# Patient Record
Sex: Male | Born: 1960 | Race: White | Hispanic: No | Smoking: Former smoker
Health system: Southern US, Community
[De-identification: ages and names within clinical notes are randomized; demographics above are authoritative.]

## PROBLEM LIST (undated history)

## (undated) DIAGNOSIS — M109 Gout, unspecified: Secondary | ICD-10-CM

## (undated) DIAGNOSIS — I2510 Atherosclerotic heart disease of native coronary artery without angina pectoris: Secondary | ICD-10-CM

## (undated) DIAGNOSIS — M2550 Pain in unspecified joint: Secondary | ICD-10-CM

## (undated) DIAGNOSIS — M25569 Pain in unspecified knee: Secondary | ICD-10-CM

## (undated) DIAGNOSIS — T7840XA Allergy, unspecified, initial encounter: Secondary | ICD-10-CM

## (undated) DIAGNOSIS — K602 Anal fissure, unspecified: Secondary | ICD-10-CM

## (undated) DIAGNOSIS — G4730 Sleep apnea, unspecified: Secondary | ICD-10-CM

## (undated) DIAGNOSIS — M179 Osteoarthritis of knee, unspecified: Secondary | ICD-10-CM

## (undated) DIAGNOSIS — Z973 Presence of spectacles and contact lenses: Secondary | ICD-10-CM

## (undated) DIAGNOSIS — R7303 Prediabetes: Secondary | ICD-10-CM

## (undated) DIAGNOSIS — M51369 Other intervertebral disc degeneration, lumbar region without mention of lumbar back pain or lower extremity pain: Secondary | ICD-10-CM

## (undated) DIAGNOSIS — I10 Essential (primary) hypertension: Secondary | ICD-10-CM

## (undated) DIAGNOSIS — K5130 Ulcerative (chronic) rectosigmoiditis without complications: Secondary | ICD-10-CM

## (undated) DIAGNOSIS — Z9189 Other specified personal risk factors, not elsewhere classified: Secondary | ICD-10-CM

## (undated) DIAGNOSIS — E785 Hyperlipidemia, unspecified: Secondary | ICD-10-CM

## (undated) DIAGNOSIS — M549 Dorsalgia, unspecified: Secondary | ICD-10-CM

## (undated) DIAGNOSIS — S83206A Unspecified tear of unspecified meniscus, current injury, right knee, initial encounter: Secondary | ICD-10-CM

## (undated) DIAGNOSIS — K219 Gastro-esophageal reflux disease without esophagitis: Secondary | ICD-10-CM

## (undated) DIAGNOSIS — M1710 Unilateral primary osteoarthritis, unspecified knee: Secondary | ICD-10-CM

## (undated) DIAGNOSIS — K635 Polyp of colon: Secondary | ICD-10-CM

## (undated) DIAGNOSIS — M5136 Other intervertebral disc degeneration, lumbar region: Secondary | ICD-10-CM

## (undated) DIAGNOSIS — H04129 Dry eye syndrome of unspecified lacrimal gland: Secondary | ICD-10-CM

## (undated) HISTORY — DX: Ulcerative (chronic) rectosigmoiditis without complications: K51.30

## (undated) HISTORY — DX: Dorsalgia, unspecified: M54.9

## (undated) HISTORY — DX: Pain in unspecified knee: M25.569

## (undated) HISTORY — DX: Allergy, unspecified, initial encounter: T78.40XA

## (undated) HISTORY — DX: Pain in unspecified joint: M25.50

## (undated) HISTORY — DX: Other intervertebral disc degeneration, lumbar region without mention of lumbar back pain or lower extremity pain: M51.369

## (undated) HISTORY — DX: Gout, unspecified: M10.9

## (undated) HISTORY — DX: Essential (primary) hypertension: I10

## (undated) HISTORY — DX: Anal fissure, unspecified: K60.2

## (undated) HISTORY — PX: WISDOM TOOTH EXTRACTION: SHX21

## (undated) HISTORY — DX: Other intervertebral disc degeneration, lumbar region: M51.36

## (undated) HISTORY — PX: FACIAL RECONSTRUCTION SURGERY: SHX631

## (undated) HISTORY — DX: Prediabetes: R73.03

## (undated) HISTORY — DX: Polyp of colon: K63.5

## (undated) HISTORY — DX: Atherosclerotic heart disease of native coronary artery without angina pectoris: I25.10

## (undated) HISTORY — DX: Dry eye syndrome of unspecified lacrimal gland: H04.129

---

## 1992-05-17 HISTORY — PX: KNEE ARTHROSCOPY WITH ANTERIOR CRUCIATE LIGAMENT (ACL) REPAIR: SHX5644

## 2006-10-03 ENCOUNTER — Emergency Department (HOSPITAL_COMMUNITY): Admission: EM | Admit: 2006-10-03 | Discharge: 2006-10-03 | Payer: Self-pay | Admitting: Family Medicine

## 2007-03-20 ENCOUNTER — Emergency Department (HOSPITAL_COMMUNITY): Admission: EM | Admit: 2007-03-20 | Discharge: 2007-03-20 | Payer: Self-pay | Admitting: Emergency Medicine

## 2008-08-02 ENCOUNTER — Emergency Department (HOSPITAL_COMMUNITY): Admission: EM | Admit: 2008-08-02 | Discharge: 2008-08-02 | Payer: Self-pay | Admitting: Emergency Medicine

## 2009-11-24 ENCOUNTER — Emergency Department (HOSPITAL_COMMUNITY): Admission: EM | Admit: 2009-11-24 | Discharge: 2009-11-24 | Payer: Self-pay | Admitting: Emergency Medicine

## 2010-10-25 ENCOUNTER — Ambulatory Visit (HOSPITAL_COMMUNITY): Payer: 59

## 2011-11-29 ENCOUNTER — Emergency Department (HOSPITAL_BASED_OUTPATIENT_CLINIC_OR_DEPARTMENT_OTHER)
Admission: EM | Admit: 2011-11-29 | Discharge: 2011-11-29 | Disposition: A | Discharge status: Home / Self Care | Payer: 59 | Attending: Emergency Medicine | Admitting: Emergency Medicine

## 2011-11-29 ENCOUNTER — Emergency Department (HOSPITAL_BASED_OUTPATIENT_CLINIC_OR_DEPARTMENT_OTHER): Payer: 59

## 2011-11-29 ENCOUNTER — Encounter (HOSPITAL_BASED_OUTPATIENT_CLINIC_OR_DEPARTMENT_OTHER): Payer: Self-pay | Admitting: *Deleted

## 2011-11-29 DIAGNOSIS — E7800 Pure hypercholesterolemia, unspecified: Secondary | ICD-10-CM | POA: Insufficient documentation

## 2011-11-29 DIAGNOSIS — X500XXA Overexertion from strenuous movement or load, initial encounter: Secondary | ICD-10-CM | POA: Insufficient documentation

## 2011-11-29 DIAGNOSIS — Y92009 Unspecified place in unspecified non-institutional (private) residence as the place of occurrence of the external cause: Secondary | ICD-10-CM | POA: Insufficient documentation

## 2011-11-29 DIAGNOSIS — S82401A Unspecified fracture of shaft of right fibula, initial encounter for closed fracture: Secondary | ICD-10-CM

## 2011-11-29 DIAGNOSIS — S82409A Unspecified fracture of shaft of unspecified fibula, initial encounter for closed fracture: Secondary | ICD-10-CM | POA: Insufficient documentation

## 2011-11-29 MED ORDER — OXYCODONE-ACETAMINOPHEN 5-325 MG PO TABS
2.0000 | ORAL_TABLET | Freq: Once | ORAL | Status: AC
  Administered 2011-11-29: 2 via ORAL
  Filled 2011-11-29: qty 2

## 2011-11-29 MED ORDER — OXYCODONE-ACETAMINOPHEN 5-325 MG PO TABS: 2.0000 | ORAL_TABLET | ORAL | Status: AC | PRN

## 2011-11-29 NOTE — ED Provider Notes (Signed)
History     CSN: [id number]  Arrival date & time 11/29/11  2208   First MD Initiated Contact with Patient 11/29/11 2220      Chief Complaint  Patient presents with  . Ankle Pain    (Consider location/radiation/quality/duration/timing/severity/associated sxs/prior treatment) HPI Comments: Pt states that when he climbed a six foot fence and landed he turned his ankle:pt c/o pain to the right lateral ankle  Patient is a 51 y.o. male presenting with ankle pain. The history is provided by the patient. No language interpreter was used.  Ankle Pain  The incident occurred less than 1 hour ago. The incident occurred at home. The injury mechanism was torsion. The pain is present in the right ankle. The quality of the pain is described as aching. The pain is moderate. The pain has been constant since onset. Pertinent negatives include no numbness, no inability to bear weight, no loss of motion, no loss of sensation and no tingling. He reports no foreign bodies present.    Past Medical History  Diagnosis Date  . High cholesterol     Past Surgical History  Procedure Date  . Facial reconstruction surgery   . Knee surgery     No family history on file.  History  Substance Use Topics  . Smoking status: Former Research scientist (life sciences)  . Smokeless tobacco: Never Used  . Alcohol Use: Not on file      Review of Systems  Constitutional: Negative.   Respiratory: Negative.   Cardiovascular: Negative.   Neurological: Negative for tingling and numbness.    Allergies  Review of patient's allergies indicates no known allergies.  Home Medications  No current outpatient prescriptions on file.  BP 143/87  Pulse 85  Temp 99.2 F (37.3 C) (Oral)  Resp 18  Ht 6' 1"  (1.854 m)  Wt 237 lb (107.502 kg)  BMI 31.27 kg/m2  SpO2 95%  Physical Exam  Nursing note and vitals reviewed. Constitutional: He is oriented to person, place, and time. He appears well-developed and well-nourished.  Cardiovascular:  Normal rate and regular rhythm.   Pulmonary/Chest: Effort normal and breath sounds normal.  Musculoskeletal: Normal range of motion.       Mild swelling noted to the right lateral ankle:pt has full rom:no gross deformity noted  Neurological: He is alert and oriented to person, place, and time.  Skin: Skin is warm and dry.  Psychiatric: He has a normal mood and affect.    ED Course  Procedures (including critical care time)  Labs Reviewed - No data to display Dg Ankle Complete Right  11/29/2011  *RADIOLOGY REPORT*  Clinical Data: Fall, lateral ankle pain  RIGHT ANKLE - COMPLETE 3+ VIEW  Comparison: None.  Findings: Oblique distal right fibular metadiaphyseal fracture noted.  Remote well corticated distal fibular avulsion fracture fragment or accessory ossicle.  The mortise is symmetric.  Lateral malleolar soft tissue swelling.  No radiopaque foreign body.  IMPRESSION: Oblique distal right fibular metadiaphyseal fracture.  Original Report Authenticated By: Arline Asp, M.D.     1. Right fibular fracture       MDM  Pt splinted by nursing staff;pt states that he is going to follow up with Dr. Collins:pt is neurologically intact        Glendell Docker, NP 11/29/11 Chicken, NP 11/29/11 5465

## 2011-11-29 NOTE — ED Notes (Signed)
Pt reports he climbed 6 foot fence and jumped- landed on right ankle and turned it

## 2011-12-02 NOTE — ED Provider Notes (Signed)
Medical screening examination/treatment/procedure(s) were performed by non-physician practitioner and as supervising physician I was immediately available for consultation/collaboration.   Saddie Benders. Dorna Mai, MD 12/02/11 2211

## 2012-02-21 ENCOUNTER — Ambulatory Visit: Payer: 59 | Attending: Specialist | Admitting: Physical Therapy

## 2012-02-21 DIAGNOSIS — IMO0001 Reserved for inherently not codable concepts without codable children: Secondary | ICD-10-CM | POA: Insufficient documentation

## 2012-02-21 DIAGNOSIS — M25669 Stiffness of unspecified knee, not elsewhere classified: Secondary | ICD-10-CM | POA: Insufficient documentation

## 2012-02-21 DIAGNOSIS — M25569 Pain in unspecified knee: Secondary | ICD-10-CM | POA: Insufficient documentation

## 2012-02-21 DIAGNOSIS — R5381 Other malaise: Secondary | ICD-10-CM | POA: Insufficient documentation

## 2012-02-23 ENCOUNTER — Ambulatory Visit: Payer: 59 | Admitting: Physical Therapy

## 2012-02-29 ENCOUNTER — Ambulatory Visit: Payer: 59 | Admitting: Rehabilitation

## 2012-03-02 ENCOUNTER — Ambulatory Visit: Payer: 59 | Admitting: Rehabilitation

## 2012-03-07 ENCOUNTER — Encounter: Payer: Self-pay | Admitting: Gastroenterology

## 2012-03-07 ENCOUNTER — Ambulatory Visit: Payer: 59 | Admitting: Rehabilitation

## 2012-03-09 ENCOUNTER — Ambulatory Visit: Payer: 59 | Admitting: Rehabilitation

## 2012-03-14 ENCOUNTER — Ambulatory Visit: Payer: 59 | Admitting: Physical Therapy

## 2012-03-16 ENCOUNTER — Ambulatory Visit: Payer: 59 | Admitting: Physical Therapy

## 2012-03-21 ENCOUNTER — Ambulatory Visit: Payer: 59 | Attending: Specialist | Admitting: Rehabilitation

## 2012-03-21 DIAGNOSIS — M25569 Pain in unspecified knee: Secondary | ICD-10-CM | POA: Insufficient documentation

## 2012-03-21 DIAGNOSIS — IMO0001 Reserved for inherently not codable concepts without codable children: Secondary | ICD-10-CM | POA: Insufficient documentation

## 2012-03-21 DIAGNOSIS — M25669 Stiffness of unspecified knee, not elsewhere classified: Secondary | ICD-10-CM | POA: Insufficient documentation

## 2012-03-21 DIAGNOSIS — R5381 Other malaise: Secondary | ICD-10-CM | POA: Insufficient documentation

## 2012-03-23 ENCOUNTER — Ambulatory Visit: Payer: 59 | Admitting: Rehabilitation

## 2012-03-28 ENCOUNTER — Ambulatory Visit: Payer: 59 | Admitting: Physical Therapy

## 2012-03-30 ENCOUNTER — Ambulatory Visit: Payer: 59 | Admitting: Physical Therapy

## 2012-04-03 ENCOUNTER — Ambulatory Visit (AMBULATORY_SURGERY_CENTER): Payer: 59 | Admitting: *Deleted

## 2012-04-03 VITALS — Ht 73.0 in | Wt 245.0 lb

## 2012-04-03 DIAGNOSIS — Z1211 Encounter for screening for malignant neoplasm of colon: Secondary | ICD-10-CM

## 2012-04-03 MED ORDER — MOVIPREP 100 G PO SOLR: ORAL | Status: DC

## 2012-04-04 ENCOUNTER — Encounter: Payer: 59 | Admitting: Physical Therapy

## 2012-04-06 ENCOUNTER — Encounter: Payer: 59 | Admitting: Rehabilitation

## 2012-04-24 ENCOUNTER — Telehealth: Payer: Self-pay | Admitting: *Deleted

## 2012-04-24 ENCOUNTER — Ambulatory Visit (AMBULATORY_SURGERY_CENTER): Payer: 59 | Admitting: Gastroenterology

## 2012-04-24 ENCOUNTER — Other Ambulatory Visit: Payer: Self-pay | Admitting: Gastroenterology

## 2012-04-24 ENCOUNTER — Encounter: Payer: Self-pay | Admitting: Gastroenterology

## 2012-04-24 VITALS — BP 136/91 | HR 50 | Temp 98.9°F | Resp 19 | Ht 73.0 in | Wt 245.0 lb

## 2012-04-24 DIAGNOSIS — D126 Benign neoplasm of colon, unspecified: Secondary | ICD-10-CM

## 2012-04-24 DIAGNOSIS — Z1211 Encounter for screening for malignant neoplasm of colon: Secondary | ICD-10-CM

## 2012-04-24 DIAGNOSIS — K635 Polyp of colon: Secondary | ICD-10-CM

## 2012-04-24 HISTORY — PX: COLONOSCOPY: SHX174

## 2012-04-24 MED ORDER — PRAMOXINE-HC 1-1 % EX CREA: TOPICAL_CREAM | CUTANEOUS | Status: AC

## 2012-04-24 MED ORDER — SODIUM CHLORIDE 0.9 % IV SOLN: 500.0000 mL | INTRAVENOUS | Status: DC

## 2012-04-24 MED ORDER — PRAMOXINE-HC 1-1 % EX CREA: TOPICAL_CREAM | CUTANEOUS | Status: DC

## 2012-04-24 NOTE — Patient Instructions (Addendum)
2 small polyps removed and sent to pathology.  Await pathology results for final recommendations.   YOU HAD AN ENDOSCOPIC PROCEDURE TODAY AT [facility]: Refer to the procedure report that was given to you for any specific questions about what was found during the examination.  If the procedure report does not answer your questions, please call your gastroenterologist to clarify.  If you requested that your care partner not be given the details of your procedure findings, then the procedure report has been included in a sealed envelope for you to review at your convenience later.  YOU SHOULD EXPECT: Some feelings of bloating in the abdomen. Passage of more gas than usual.  Walking can help get rid of the air that was put into your GI tract during the procedure and reduce the bloating. If you had a lower endoscopy (such as a colonoscopy or flexible sigmoidoscopy) you may notice spotting of blood in your stool or on the toilet paper. If you underwent a bowel prep for your procedure, then you may not have a normal bowel movement for a few days.  DIET: Your first meal following the procedure should be a light meal and then it is ok to progress to your normal diet.  A half-sandwich or bowl of soup is an example of a good first meal.  Heavy or fried foods are harder to digest and may make you feel nauseous or bloated.  Likewise meals heavy in dairy and vegetables can cause extra gas to form and this can also increase the bloating.  Drink plenty of fluids but you should avoid alcoholic beverages for 24 hours.  ACTIVITY: Your care partner should take you home directly after the procedure.  You should plan to take it easy, moving slowly for the rest of the day.  You can resume normal activity the day after the procedure however you should NOT DRIVE or use heavy machinery for 24 hours (because of the sedation medicines used during the test).    SYMPTOMS TO REPORT IMMEDIATELY: A gastroenterologist  can be reached at any hour.  During normal business hours, 8:30 AM to 5:00 PM Monday through Friday, call [phone].  After hours and on weekends, please call the GI answering service at [phone] who will take a message and have the physician on call contact you.   Following lower endoscopy (colonoscopy or flexible sigmoidoscopy):  Excessive amounts of blood in the stool  Significant tenderness or worsening of abdominal pains  Swelling of the abdomen that is new, acute  Fever of 100F or higher  FOLLOW UP: If any biopsies were taken you will be contacted by phone or by letter within the next 1-3 weeks.  Call your gastroenterologist if you have not heard about the biopsies in 3 weeks.  Our staff will call the home number listed on your records the next business day following your procedure to check on you and address any questions or concerns that you may have at that time regarding the information given to you following your procedure. This is a courtesy call and so if there is no answer at the home number and we have not heard from you through the emergency physician on call, we will assume that you have returned to your regular daily activities without incident.  SIGNATURES/CONFIDENTIALITY: You and/or your care partner have signed paperwork which will be entered into your electronic medical record.  These signatures attest to the fact that that the information above on your  After Visit Summary has been reviewed and is understood.  Full responsibility of the confidentiality of this discharge information lies with you and/or your care-partner.

## 2012-04-24 NOTE — Progress Notes (Signed)
Pts wife asked about a "cream" that Dr Sharlett Iles had said he would get for pt.  Checked dumbwaiter for samples but found none.  Called Cynthia Dr Tomie China nurse and she stated that they are out of Analpram samples and she would call in an Rx to [facility] outpatient pharmacy.

## 2012-04-24 NOTE — Progress Notes (Signed)
Called to room to assist during endoscopic procedure.  Patient ID and intended procedure confirmed with present staff. Received instructions for my participation in the procedure from the performing physician.  

## 2012-04-24 NOTE — Op Note (Signed)
[facility]  [address] [place], [id number]   COLONOSCOPY PROCEDURE REPORT  PATIENT: David Newman, David Newman  MR#: [id number] BIRTHDATE: January 25, 1961 , 51  yrs. old GENDER: Male ENDOSCOPIST: Sable Feil, MD, Marval Regal REFERRED BY:  Gwendolyn Grant, M.D. PROCEDURE DATE:  04/24/2012 PROCEDURE:   Colonoscopy with biopsy ASA CLASS:   Class II INDICATIONS:Patient's family history of colon polyps. MEDICATIONS: propofol (Diprivan) 31m IV  DESCRIPTION OF PROCEDURE:   After the risks and benefits and of the procedure were explained, informed consent was obtained.  A digital rectal exam revealed no abnormalities of the rectum.    The LB CF-[id number] endoscope was introduced through the anus and advanced to the cecum, which was identified by both the appendix and ileocecal valve .  The quality of the prep was adequate. .  The instrument was then slowly withdrawn as the colon was fully examined.     COLON FINDINGS: A normal appearing cecum, ileocecal valve, and appendiceal orifice were identified.  The ascending, hepatic flexure, transverse, splenic flexure, descending, sigmoid colon and rectum appeared unremarkable.  No polyps or cancers were seen. Two small smooth flat polyps were found in the sigmoid colon. Multiple biopsies of the area were performed using cold forceps. The scope was then withdrawn from the patient and the procedure completed.  COMPLICATIONS: There were no complications. ENDOSCOPIC IMPRESSION: 1.   Normal colon,no mucosal lesions noted... 2.   Two small flat polyps were found in the sigmoid colon; multiple biopsies of the area were performed using cold forceps ..Marland Kitchen/O adenomas  RECOMMENDATIONS: 1.  Repeat colonoscopy in 5 years if polyp adenomatous; otherwise 10 years 2.  Await biopsy results   REPEAT EXAM:  cc:  _______________________________ eSigned:Sable Feil MD, [facility]12/01/2012 12:15 PM

## 2012-04-24 NOTE — Progress Notes (Signed)
Patient did not experience any of the following events: a burn prior to discharge; a fall within the facility; wrong site/side/patient/procedure/implant event; or a hospital transfer or hospital admission upon discharge from the facility. (G8907) Patient did not have preoperative order for IV antibiotic SSI prophylaxis. (G8918)  

## 2012-04-24 NOTE — Telephone Encounter (Signed)
Ordered analpram for pt; we have no samples

## 2012-04-25 ENCOUNTER — Telehealth: Payer: Self-pay | Admitting: *Deleted

## 2012-04-25 NOTE — Telephone Encounter (Signed)
  Follow up Call-  Call back number 04/24/2012  Post procedure Call Back phone  # [phone]  Permission to leave phone message Yes     [facility]

## 2012-04-28 ENCOUNTER — Encounter: Payer: Self-pay | Admitting: Gastroenterology

## 2014-01-15 ENCOUNTER — Other Ambulatory Visit (HOSPITAL_COMMUNITY): Payer: Self-pay | Admitting: Specialist

## 2014-01-15 DIAGNOSIS — M25561 Pain in right knee: Secondary | ICD-10-CM

## 2014-01-23 ENCOUNTER — Ambulatory Visit (HOSPITAL_COMMUNITY)
Admission: RE | Admit: 2014-01-23 | Discharge: 2014-01-23 | Disposition: A | Discharge status: Home / Self Care | Payer: 59 | Source: Ambulatory Visit | Attending: Specialist | Admitting: Specialist

## 2014-01-23 DIAGNOSIS — X58XXXA Exposure to other specified factors, initial encounter: Secondary | ICD-10-CM | POA: Diagnosis not present

## 2014-01-23 DIAGNOSIS — IMO0002 Reserved for concepts with insufficient information to code with codable children: Secondary | ICD-10-CM | POA: Diagnosis not present

## 2014-01-23 DIAGNOSIS — M7120 Synovial cyst of popliteal space [Baker], unspecified knee: Secondary | ICD-10-CM | POA: Diagnosis not present

## 2014-01-23 DIAGNOSIS — S83289A Other tear of lateral meniscus, current injury, unspecified knee, initial encounter: Secondary | ICD-10-CM | POA: Diagnosis not present

## 2014-01-23 DIAGNOSIS — Z9889 Other specified postprocedural states: Secondary | ICD-10-CM | POA: Diagnosis not present

## 2014-01-23 DIAGNOSIS — M25469 Effusion, unspecified knee: Secondary | ICD-10-CM | POA: Diagnosis not present

## 2014-01-23 DIAGNOSIS — M2340 Loose body in knee, unspecified knee: Secondary | ICD-10-CM | POA: Diagnosis not present

## 2014-01-23 DIAGNOSIS — M25569 Pain in unspecified knee: Secondary | ICD-10-CM | POA: Diagnosis present

## 2014-01-23 DIAGNOSIS — M25561 Pain in right knee: Secondary | ICD-10-CM

## 2014-01-23 DIAGNOSIS — M9420 Chondromalacia, unspecified site: Secondary | ICD-10-CM | POA: Diagnosis not present

## 2014-02-28 ENCOUNTER — Other Ambulatory Visit: Payer: Self-pay | Admitting: Orthopedic Surgery

## 2014-03-26 ENCOUNTER — Encounter (HOSPITAL_BASED_OUTPATIENT_CLINIC_OR_DEPARTMENT_OTHER): Payer: Self-pay | Admitting: *Deleted

## 2014-03-27 ENCOUNTER — Encounter (HOSPITAL_BASED_OUTPATIENT_CLINIC_OR_DEPARTMENT_OTHER): Payer: Self-pay | Admitting: *Deleted

## 2014-03-27 NOTE — Progress Notes (Signed)
   03/27/14 1358  OBSTRUCTIVE SLEEP APNEA  Have you ever been diagnosed with sleep apnea through a sleep study? No  Do you snore loudly (loud enough to be heard through closed doors)?  1  Do you often feel tired, fatigued, or sleepy during the daytime? 0  Has anyone observed you stop breathing during your sleep? 1  Do you have, or are you being treated for high blood pressure? 0  BMI more than 35 kg/m2? 0  Age over 52 years old? 1  Neck circumference greater than 40 cm/16 inches? 1  Gender: 1  Obstructive Sleep Apnea Score 5  Score 4 or greater  Results sent to PCP

## 2014-03-27 NOTE — Progress Notes (Signed)
NPO AFTER MN. ARRIVE AT 0600. NEEDS HG. MAY TAKE TYLENOL IF NEEDED AM DOS W/ SIPS OF WATER.

## 2014-03-28 NOTE — Anesthesia Preprocedure Evaluation (Addendum)
Anesthesia Evaluation  Patient identified by MRN, date of birth, ID band Patient awake    Reviewed: Allergy & Precautions, H&P , NPO status , Patient's Chart, lab work & pertinent test results  Airway Mallampati: II  TM Distance: >3 FB Neck ROM: Full    Dental no notable dental hx. (+) Teeth Intact, Dental Advisory Given   Pulmonary former smoker,  breath sounds clear to auscultation  Pulmonary exam normal       Cardiovascular negative cardio ROS  Rhythm:Regular Rate:Normal     Neuro/Psych negative neurological ROS  negative psych ROS   GI/Hepatic negative GI ROS, Neg liver ROS, GERD-  ,  Endo/Other  negative endocrine ROS  Renal/GU negative Renal ROS     Musculoskeletal negative musculoskeletal ROS (+) Arthritis -, Osteoarthritis,    Abdominal   Peds  Hematology negative hematology ROS (+)   Anesthesia Other Findings   Reproductive/Obstetrics                            Anesthesia Physical Anesthesia Plan  ASA: II  Anesthesia Plan: General   Post-op Pain Management:    Induction: Intravenous  Airway Management Planned: LMA  Additional Equipment:   Intra-op Plan:   Post-operative Plan: Extubation in OR  Informed Consent: I have reviewed the patients History and Physical, chart, labs and discussed the procedure including the risks, benefits and alternatives for the proposed anesthesia with the patient or authorized representative who has indicated his/her understanding and acceptance.   Dental advisory given  Plan Discussed with: CRNA  Anesthesia Plan Comments:         Anesthesia Quick Evaluation

## 2014-03-29 ENCOUNTER — Encounter (HOSPITAL_BASED_OUTPATIENT_CLINIC_OR_DEPARTMENT_OTHER): Payer: Self-pay | Admitting: *Deleted

## 2014-03-29 ENCOUNTER — Ambulatory Visit (HOSPITAL_BASED_OUTPATIENT_CLINIC_OR_DEPARTMENT_OTHER)
Admission: RE | Admit: 2014-03-29 | Discharge: 2014-03-29 | Disposition: A | Discharge status: Home / Self Care | Payer: 59 | Source: Ambulatory Visit | Attending: Specialist | Admitting: Specialist

## 2014-03-29 ENCOUNTER — Encounter (HOSPITAL_BASED_OUTPATIENT_CLINIC_OR_DEPARTMENT_OTHER)
Admission: RE | Disposition: A | Discharge status: Home / Self Care | Payer: Self-pay | Source: Ambulatory Visit | Attending: Specialist

## 2014-03-29 ENCOUNTER — Ambulatory Visit (HOSPITAL_BASED_OUTPATIENT_CLINIC_OR_DEPARTMENT_OTHER): Payer: 59 | Admitting: Anesthesiology

## 2014-03-29 DIAGNOSIS — K219 Gastro-esophageal reflux disease without esophagitis: Secondary | ICD-10-CM | POA: Insufficient documentation

## 2014-03-29 DIAGNOSIS — S83241A Other tear of medial meniscus, current injury, right knee, initial encounter: Secondary | ICD-10-CM | POA: Diagnosis not present

## 2014-03-29 DIAGNOSIS — Y9289 Other specified places as the place of occurrence of the external cause: Secondary | ICD-10-CM | POA: Diagnosis not present

## 2014-03-29 DIAGNOSIS — X58XXXA Exposure to other specified factors, initial encounter: Secondary | ICD-10-CM | POA: Diagnosis not present

## 2014-03-29 DIAGNOSIS — Y998 Other external cause status: Secondary | ICD-10-CM | POA: Insufficient documentation

## 2014-03-29 DIAGNOSIS — Z87891 Personal history of nicotine dependence: Secondary | ICD-10-CM | POA: Insufficient documentation

## 2014-03-29 DIAGNOSIS — Z9889 Other specified postprocedural states: Secondary | ICD-10-CM

## 2014-03-29 DIAGNOSIS — Z7982 Long term (current) use of aspirin: Secondary | ICD-10-CM | POA: Diagnosis not present

## 2014-03-29 DIAGNOSIS — S83271A Complex tear of lateral meniscus, current injury, right knee, initial encounter: Secondary | ICD-10-CM | POA: Insufficient documentation

## 2014-03-29 DIAGNOSIS — Y9389 Activity, other specified: Secondary | ICD-10-CM | POA: Insufficient documentation

## 2014-03-29 DIAGNOSIS — M2241 Chondromalacia patellae, right knee: Secondary | ICD-10-CM | POA: Insufficient documentation

## 2014-03-29 DIAGNOSIS — M1711 Unilateral primary osteoarthritis, right knee: Secondary | ICD-10-CM | POA: Insufficient documentation

## 2014-03-29 DIAGNOSIS — E785 Hyperlipidemia, unspecified: Secondary | ICD-10-CM | POA: Diagnosis not present

## 2014-03-29 DIAGNOSIS — M94261 Chondromalacia, right knee: Secondary | ICD-10-CM | POA: Diagnosis not present

## 2014-03-29 DIAGNOSIS — Z882 Allergy status to sulfonamides status: Secondary | ICD-10-CM | POA: Insufficient documentation

## 2014-03-29 HISTORY — DX: Presence of spectacles and contact lenses: Z97.3

## 2014-03-29 HISTORY — DX: Osteoarthritis of knee, unspecified: M17.9

## 2014-03-29 HISTORY — DX: Unspecified tear of unspecified meniscus, current injury, right knee, initial encounter: S83.206A

## 2014-03-29 HISTORY — DX: Hyperlipidemia, unspecified: E78.5

## 2014-03-29 HISTORY — DX: Gastro-esophageal reflux disease without esophagitis: K21.9

## 2014-03-29 HISTORY — PX: KNEE ARTHROSCOPY: SHX127

## 2014-03-29 HISTORY — DX: Unilateral primary osteoarthritis, unspecified knee: M17.10

## 2014-03-29 HISTORY — DX: Other specified personal risk factors, not elsewhere classified: Z91.89

## 2014-03-29 LAB — POCT HEMOGLOBIN-HEMACUE: HEMOGLOBIN: 15.2 g/dL (ref 13.0–17.0)

## 2014-03-29 SURGERY — ARTHROSCOPY, KNEE
Anesthesia: General | Site: Knee | Laterality: Right

## 2014-03-29 MED ORDER — PROPOFOL 10 MG/ML IV BOLUS
INTRAVENOUS | Status: DC | PRN
  Administered 2014-03-29: 200 mg via INTRAVENOUS

## 2014-03-29 MED ORDER — FENTANYL CITRATE 0.05 MG/ML IJ SOLN
INTRAMUSCULAR | Status: AC
  Filled 2014-03-29: qty 6

## 2014-03-29 MED ORDER — MIDAZOLAM HCL 5 MG/5ML IJ SOLN
INTRAMUSCULAR | Status: DC | PRN
  Administered 2014-03-29: 2 mg via INTRAVENOUS

## 2014-03-29 MED ORDER — CEFAZOLIN SODIUM-DEXTROSE 2-3 GM-% IV SOLR
2.0000 g | INTRAVENOUS | Status: AC
  Administered 2014-03-29: 2 g via INTRAVENOUS
  Filled 2014-03-29: qty 50

## 2014-03-29 MED ORDER — ONDANSETRON HCL 4 MG/2ML IJ SOLN
INTRAMUSCULAR | Status: DC | PRN
  Administered 2014-03-29: 4 mg via INTRAVENOUS

## 2014-03-29 MED ORDER — MORPHINE SULFATE 4 MG/ML IJ SOLN
INTRAMUSCULAR | Status: DC | PRN
  Administered 2014-03-29: 4 mg via INTRAVENOUS

## 2014-03-29 MED ORDER — OXYCODONE HCL 5 MG PO TABS
5.0000 mg | ORAL_TABLET | Freq: Once | ORAL | Status: DC | PRN
  Filled 2014-03-29: qty 1

## 2014-03-29 MED ORDER — HYDROCODONE-ACETAMINOPHEN 5-325 MG PO TABS: 1.0000 | ORAL_TABLET | Freq: Four times a day (QID) | ORAL | Status: DC | PRN

## 2014-03-29 MED ORDER — HYDROCODONE-ACETAMINOPHEN 5-325 MG PO TABS
1.0000 | ORAL_TABLET | Freq: Four times a day (QID) | ORAL | Status: DC | PRN
  Administered 2014-03-29: 1 via ORAL
  Filled 2014-03-29: qty 2

## 2014-03-29 MED ORDER — MIDAZOLAM HCL 2 MG/2ML IJ SOLN
INTRAMUSCULAR | Status: AC
  Filled 2014-03-29: qty 2

## 2014-03-29 MED ORDER — MEPERIDINE HCL 25 MG/ML IJ SOLN
6.2500 mg | INTRAMUSCULAR | Status: DC | PRN
  Filled 2014-03-29: qty 1

## 2014-03-29 MED ORDER — HYDROCODONE-ACETAMINOPHEN 5-325 MG PO TABS
ORAL_TABLET | ORAL | Status: AC
  Filled 2014-03-29: qty 1

## 2014-03-29 MED ORDER — MORPHINE SULFATE 4 MG/ML IJ SOLN
INTRAMUSCULAR | Status: AC
  Filled 2014-03-29: qty 1

## 2014-03-29 MED ORDER — BUPIVACAINE HCL 0.25 % IJ SOLN
INTRAMUSCULAR | Status: DC | PRN
  Administered 2014-03-29: 30 mL

## 2014-03-29 MED ORDER — FENTANYL CITRATE 0.05 MG/ML IJ SOLN
INTRAMUSCULAR | Status: DC | PRN
  Administered 2014-03-29: 50 ug via INTRAVENOUS
  Administered 2014-03-29: 150 ug via INTRAVENOUS

## 2014-03-29 MED ORDER — CEFAZOLIN SODIUM-DEXTROSE 2-3 GM-% IV SOLR
INTRAVENOUS | Status: AC
Start: 2014-03-29 — End: 2014-03-29
  Filled 2014-03-29: qty 50

## 2014-03-29 MED ORDER — CEPHALEXIN 500 MG PO CAPS: 500.0000 mg | ORAL_CAPSULE | Freq: Three times a day (TID) | ORAL | Status: DC

## 2014-03-29 MED ORDER — LACTATED RINGERS IV SOLN
INTRAVENOUS | Status: DC
  Administered 2014-03-29: 07:00:00 via INTRAVENOUS
  Filled 2014-03-29: qty 1000

## 2014-03-29 MED ORDER — ASPIRIN EC 325 MG PO TBEC: 325.0000 mg | DELAYED_RELEASE_TABLET | Freq: Two times a day (BID) | ORAL | Status: DC

## 2014-03-29 MED ORDER — LIDOCAINE HCL (CARDIAC) 20 MG/ML IV SOLN
INTRAVENOUS | Status: DC | PRN
  Administered 2014-03-29: 80 mg via INTRAVENOUS

## 2014-03-29 MED ORDER — SODIUM CHLORIDE 0.9 % IR SOLN
Status: DC | PRN
  Administered 2014-03-29: 6000 mL
  Administered 2014-03-29: 3000 mL

## 2014-03-29 MED ORDER — OXYCODONE HCL 5 MG/5ML PO SOLN
5.0000 mg | Freq: Once | ORAL | Status: DC | PRN
  Filled 2014-03-29: qty 5

## 2014-03-29 MED ORDER — PROMETHAZINE HCL 25 MG/ML IJ SOLN
6.2500 mg | INTRAMUSCULAR | Status: DC | PRN
  Filled 2014-03-29: qty 1

## 2014-03-29 MED ORDER — DEXAMETHASONE SODIUM PHOSPHATE 4 MG/ML IJ SOLN
INTRAMUSCULAR | Status: DC | PRN
  Administered 2014-03-29: 10 mg via INTRAVENOUS

## 2014-03-29 MED ORDER — POVIDONE-IODINE 7.5 % EX SOLN
Freq: Once | CUTANEOUS | Status: DC
  Filled 2014-03-29: qty 118

## 2014-03-29 MED ORDER — HYDROMORPHONE HCL 1 MG/ML IJ SOLN
0.2500 mg | INTRAMUSCULAR | Status: DC | PRN
  Filled 2014-03-29: qty 1

## 2014-03-29 SURGICAL SUPPLY — 59 items
BANDAGE ESMARK 6X9 LF (GAUZE/BANDAGES/DRESSINGS) ×1 IMPLANT
BLADE 4.2CUDA (BLADE) IMPLANT
BLADE CUDA 4.2 (BLADE) IMPLANT
BLADE CUDA GRT WHITE 3.5 (BLADE) ×2 IMPLANT
BLADE CUDA SHAVER 3.5 (BLADE) IMPLANT
BNDG ESMARK 6X9 LF (GAUZE/BANDAGES/DRESSINGS) ×2
BNDG GAUZE ELAST 4 BULKY (GAUZE/BANDAGES/DRESSINGS) ×2 IMPLANT
CANISTER SUCT LVC 12 LTR MEDI- (MISCELLANEOUS) ×2 IMPLANT
CANISTER SUCTION 1200CC (MISCELLANEOUS) IMPLANT
CANISTER SUCTION 2500CC (MISCELLANEOUS) ×4 IMPLANT
CLOTH BEACON ORANGE TIMEOUT ST (SAFETY) ×2 IMPLANT
CUFF TOURNIQUET SINGLE 34IN LL (TOURNIQUET CUFF) ×2 IMPLANT
DRAPE ARTHROSCOPY W/POUCH 114 (DRAPES) ×2 IMPLANT
DRAPE INCISE 23X17 IOBAN STRL (DRAPES)
DRAPE INCISE IOBAN 23X17 STRL (DRAPES) IMPLANT
DRAPE INCISE IOBAN 66X45 STRL (DRAPES) ×2 IMPLANT
DRSG PAD ABDOMINAL 8X10 ST (GAUZE/BANDAGES/DRESSINGS) ×2 IMPLANT
DURAPREP 26ML APPLICATOR (WOUND CARE) ×2 IMPLANT
ELECT MENISCUS 165MM 90D (ELECTRODE) IMPLANT
ELECT REM PT RETURN 9FT ADLT (ELECTROSURGICAL)
ELECTRODE REM PT RTRN 9FT ADLT (ELECTROSURGICAL) IMPLANT
GAUZE XEROFORM 1X8 LF (GAUZE/BANDAGES/DRESSINGS) ×2 IMPLANT
GLOVE BIO SURGEON STRL SZ 6.5 (GLOVE) ×2 IMPLANT
GLOVE BIO SURGEON STRL SZ7.5 (GLOVE) ×2 IMPLANT
GLOVE BIO SURGEON STRL SZ8 (GLOVE) ×2 IMPLANT
GLOVE BIOGEL PI IND STRL 6.5 (GLOVE) ×2 IMPLANT
GLOVE BIOGEL PI INDICATOR 6.5 (GLOVE) ×2
GLOVE INDICATOR 8.0 STRL GRN (GLOVE) ×4 IMPLANT
GLOVE SURG ORTHO 8.0 STRL STRW (GLOVE) IMPLANT
GOWN PREVENTION PLUS LG XLONG (DISPOSABLE) IMPLANT
GOWN STRL REIN XL XLG (GOWN DISPOSABLE) IMPLANT
GOWN STRL REUS W/ TWL LRG LVL3 (GOWN DISPOSABLE) ×1 IMPLANT
GOWN STRL REUS W/ TWL XL LVL3 (GOWN DISPOSABLE) ×2 IMPLANT
GOWN STRL REUS W/TWL LRG LVL3 (GOWN DISPOSABLE) ×1
GOWN STRL REUS W/TWL XL LVL3 (GOWN DISPOSABLE) ×2
IMMOBILIZER KNEE 22 UNIV (SOFTGOODS) IMPLANT
IMMOBILIZER KNEE 24 THIGH 36 (MISCELLANEOUS) IMPLANT
IMMOBILIZER KNEE 24 UNIV (MISCELLANEOUS)
IV NS IRRIG 3000ML ARTHROMATIC (IV SOLUTION) ×4 IMPLANT
KNEE WRAP E Z 3 GEL PACK (MISCELLANEOUS) ×2 IMPLANT
MINI VAC (SURGICAL WAND) ×2 IMPLANT
NEEDLE HYPO 22GX1.5 SAFETY (NEEDLE) ×2 IMPLANT
PACK ARTHROSCOPY DSU (CUSTOM PROCEDURE TRAY) ×2 IMPLANT
PACK BASIN DAY SURGERY FS (CUSTOM PROCEDURE TRAY) ×2 IMPLANT
PAD ABD 8X10 STRL (GAUZE/BANDAGES/DRESSINGS) ×4 IMPLANT
PADDING CAST ABS 4INX4YD NS (CAST SUPPLIES)
PADDING CAST ABS COTTON 4X4 ST (CAST SUPPLIES) IMPLANT
PENCIL BUTTON HOLSTER BLD 10FT (ELECTRODE) IMPLANT
SET ARTHROSCOPY TUBING (MISCELLANEOUS) ×1
SET ARTHROSCOPY TUBING LN (MISCELLANEOUS) ×1 IMPLANT
SPONGE GAUZE 4X4 12PLY (GAUZE/BANDAGES/DRESSINGS) ×2 IMPLANT
SPONGE GAUZE 4X4 12PLY STER LF (GAUZE/BANDAGES/DRESSINGS) ×2 IMPLANT
SUT ETHILON 4 0 PS 2 18 (SUTURE) ×2 IMPLANT
SYR CONTROL 10ML LL (SYRINGE) ×2 IMPLANT
TOWEL OR 17X24 6PK STRL BLUE (TOWEL DISPOSABLE) ×2 IMPLANT
TUBE CONNECTING 12X1/4 (SUCTIONS) ×2 IMPLANT
WAND 30 DEG SABER W/CORD (SURGICAL WAND) IMPLANT
WAND 90 DEG TURBOVAC W/CORD (SURGICAL WAND) IMPLANT
WATER STERILE IRR 500ML POUR (IV SOLUTION) ×2 IMPLANT

## 2014-03-29 NOTE — H&P (Signed)
David Newman is an 53 y.o. male.   Chief Complaint: Right knee discomfort HPI: Patient presents with joint discomfort that had been persistent for several months now. Despite conservative treatments, his discomfort has not improved. Imaging was obtained. Other conservative and surgical treatments were discussed in detail. Patient wishes to proceed with surgery as consented. Denies SOB, CP, or calf pain. No Fever, chills, or nausea/ vomiting. History of right ACL by Dr. Percell Miller.   Past Medical History  Diagnosis Date  . Right knee meniscal tear   . OA (osteoarthritis) of knee   . Hyperlipidemia   . GERD (gastroesophageal reflux disease)   . Wears glasses   . At risk for sleep apnea     STOP-BANG= 5    SENT TO PCP 03-27-2014  . Wears contact lenses     Past Surgical History  Procedure Laterality Date  . Facial reconstruction surgery  1973  age 88  . Knee arthroscopy with anterior cruciate ligament (acl) repair Right 1994  . Wisdom tooth extraction  age 39  . Colonoscopy  04-24-2012    Family History  Problem Relation Age of Onset  . Colon polyps Brother 43  . Colon cancer Paternal Uncle 3  . Stomach cancer Neg Hx   . Colon cancer Cousin 84   Social History:  reports that he quit smoking about 6 years ago. His smoking use included Cigarettes. He has a 35 pack-year smoking history. He has quit using smokeless tobacco. His smokeless tobacco use included Snuff. He reports that he drinks alcohol. He reports that he does not use illicit drugs.  Allergies:  Allergies  Allergen Reactions  . Sulfa Antibiotics Nausea And Vomiting    Medications Prior to Admission  Medication Sig Dispense Refill  . acetaminophen (TYLENOL) 500 MG tablet Take 1,000 mg by mouth every 6 (six) hours as needed. Patient used this medication for pain.    Marland Kitchen aspirin EC 325 MG tablet Take 325 mg by mouth every 6 (six) hours as needed.    . calcium carbonate (TUMS - DOSED IN MG ELEMENTAL CALCIUM) 500 MG  chewable tablet Chew 1 tablet by mouth as needed for indigestion or heartburn.    Marland Kitchen ibuprofen (ADVIL,MOTRIN) 200 MG tablet Take 600 mg by mouth every 6 (six) hours as needed. Patient used this medication for pain.    . rosuvastatin (CRESTOR) 5 MG tablet Take 5 mg by mouth at bedtime.     . Multiple Vitamin (ONE-A-DAY MENS PO) Take 1 tablet by mouth daily.      No results found for this or any previous visit (from the past 48 hour(s)). No results found.  Review of Systems  Constitutional: Negative.   HENT: Negative.   Eyes: Negative.   Respiratory: Negative.   Cardiovascular: Negative.   Gastrointestinal: Negative.   Genitourinary: Negative.   Musculoskeletal: Positive for joint pain.  Skin: Negative.   Neurological: Negative.   Endo/Heme/Allergies: Negative.   Psychiatric/Behavioral: Negative.     Blood pressure 140/85, pulse 62, temperature 98.4 F (36.9 C), temperature source Oral, resp. rate 16, weight 105.235 kg (232 lb), SpO2 98 %. Physical Exam  Constitutional: He is oriented to person, place, and time. He appears well-developed.  HENT:  Head: Normocephalic.  Eyes: EOM are normal.  Neck: Normal range of motion.  Cardiovascular: Normal rate, regular rhythm, normal heart sounds and intact distal pulses.   Respiratory: Effort normal and breath sounds normal.  GI: Soft. Bowel sounds are normal.  Genitourinary:  Deferred  Musculoskeletal:  Right knee pain with ambulating. Calf soft and non- tender. Sensation intact.  Neurological: He is alert and oriented to person, place, and time.  Skin: Skin is warm and dry.  Psychiatric: His behavior is normal.     Assessment/Plan Right knee MT and OA: Right knee scope PM and chondroplasty D/c home Follow up in office   STILWELL, BRYSON L 03/29/2014, 7:47 AM

## 2014-03-29 NOTE — Discharge Instructions (Signed)

## 2014-03-29 NOTE — Transfer of Care (Signed)
Immediate Anesthesia Transfer of Care Note  Patient: David Newman  Procedure(s) Performed: Procedure(s): RIGHT ARTHROSCOPY KNEE WITH DEBRIDMENT PARTIAL medial lateral MENISCECTOMY AND CHONDROPLASTY (Right)  Patient Location: PACU  Anesthesia Type:General  Level of Consciousness: awake, alert , oriented and patient cooperative  Airway & Oxygen Therapy: Patient Spontanous Breathing and Patient connected to nasal cannula oxygen  Post-op Assessment: Report given to PACU RN and Post -op Vital signs reviewed and stable  Post vital signs: Reviewed and stable  Complications: No apparent anesthesia complications

## 2014-03-29 NOTE — Interval H&P Note (Signed)
History and Physical Interval Note:  03/29/2014 7:54 AM  David Newman  has presented today for surgery, with the diagnosis of RIGHT KNEE TORN MENSICUS AND OA  The various methods of treatment have been discussed with the patient and family. After consideration of risks, benefits and other options for treatment, the patient has consented to  Procedure(s): RIGHT ARTHROSCOPY KNEE WITH DEBRIDMENT PARTIAL MENISCECTOMY AND CHONDROPLASTY (Right) as a surgical intervention .  The patient's history has been reviewed, patient examined, no change in status, stable for surgery.  I have reviewed the patient's chart and labs.  Questions were answered to the patient's satisfaction.     Braddock Servellon ANDREW

## 2014-03-29 NOTE — Anesthesia Procedure Notes (Signed)
Procedure Name: LMA Insertion Date/Time: 03/29/2014 8:03 AM Performed by: Wanita Chamberlain Pre-anesthesia Checklist: Patient identified, Emergency Drugs available, Suction available, Patient being monitored and Timeout performed Patient Re-evaluated:Patient Re-evaluated prior to inductionOxygen Delivery Method: Circle system utilized Preoxygenation: Pre-oxygenation with 100% oxygen Intubation Type: IV induction Ventilation: Mask ventilation without difficulty LMA: LMA inserted LMA Size: 5.0 Number of attempts: 1 Airway Equipment and Method: Bite block Placement Confirmation: positive ETCO2 Tube secured with: Tape Dental Injury: Teeth and Oropharynx as per pre-operative assessment

## 2014-03-29 NOTE — Anesthesia Postprocedure Evaluation (Signed)
Anesthesia Post Note  Patient: David Newman  Procedure(s) Performed: Procedure(s) (LRB): RIGHT ARTHROSCOPY KNEE WITH DEBRIDMENT PARTIAL medial lateral MENISCECTOMY AND CHONDROPLASTY (Right)  Anesthesia type: General  Patient location: PACU  Post pain: Pain level controlled  Post assessment: Post-op Vital signs reviewed  Last Vitals: BP 147/84 mmHg  Pulse 54  Temp(Src) 36.2 C (Oral)  Resp 12  Wt 232 lb (105.235 kg)  SpO2 96%  Post vital signs: Reviewed  Level of consciousness: sedated  Complications: No apparent anesthesia complications

## 2014-03-29 NOTE — Op Note (Signed)
[phone]

## 2014-03-29 NOTE — H&P (View-Only) (Signed)
   03/27/14 1358  OBSTRUCTIVE SLEEP APNEA  Have you ever been diagnosed with sleep apnea through a sleep study? No  Do you snore loudly (loud enough to be heard through closed doors)?  1  Do you often feel tired, fatigued, or sleepy during the daytime? 0  Has anyone observed you stop breathing during your sleep? 1  Do you have, or are you being treated for high blood pressure? 0  BMI more than 35 kg/m2? 0  Age over 52 years old? 1  Neck circumference greater than 40 cm/16 inches? 1  Gender: 1  Obstructive Sleep Apnea Score 5  Score 4 or greater  Results sent to PCP

## 2014-04-01 ENCOUNTER — Encounter (HOSPITAL_BASED_OUTPATIENT_CLINIC_OR_DEPARTMENT_OTHER): Payer: Self-pay | Admitting: Specialist

## 2014-04-01 NOTE — Op Note (Signed)
David Newman, GENCARELLI NO.:  [id number]  MEDICAL RECORD NO.:  [id number]  LOCATION:                                 FACILITY:  PHYSICIAN:  Cynda Familia, M.D.DATE OF BIRTH:  03-03-1961  DATE OF PROCEDURE:  03/29/2014 DATE OF DISCHARGE:  03/29/2014                              OPERATIVE REPORT   PREOPERATIVE DIAGNOSES:  Right knee torn medial meniscus, torn lateral meniscus, and osteoarthritis.  POSTOPERATIVE DIAGNOSES: 1. Right knee __________ medial meniscus, radial. 2. Complex tear lateral meniscus. 3. 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  PROCEDURE: 1. Right knee arthroscopic partial medial meniscectomy. 2. Partial lateral meniscectomy. 3. Tricompartmental chondroplasty.  SURGEON:  Cynda Familia, M.D.  ASSISTED BY:  Wyatt Portela, PA-C  ANESTHESIA:  General with intraoperative knee block.  ESTIMATED BLOOD LOSS:  Minimal.  DRAINS:  None.  COMPLICATIONS:  None.  TOURNIQUET TIME:  35 minutes at 250 mmHg.  COMPLICATIONS:  None.  DISPOSITION:  PACU stable.  OPERATIVE DETAILS:  The patient was counseled in the holding area. Correct site was identified.  Prepped appropriately.  TED hose applied on uninvolved leg on the way to the operating room.  IV Ancef was given. __________ position and general anesthesia.  The right thigh was placed to a thigh holder.  Prepped with DuraPrep and draped in sterile fashion. Time-out was done confirming the right side.  Exsanguinated with Esmarch, tourniquet was inflated to 250 mmHg.  Arthroscopic portal was established proximal medial, inferomedial, and inferolateral. Diagnostic arthroscopy revealed patellar tracking __________ but no subluxation.  Grade 3 chondromalacia of the patella, grade 3+ and 2+ femoral trochlea.  Mechanical compressed with a shaver on both  sides __________ parapatellar synovitis __________ parapatellar synovectomy was performed.  Suprapatellar pouch, medial and lateral gutters were unremarkable.  Intercondylar notch revealed __________ ACL graft.  There were multiple osteochondral loose bodies.  Intercondylar notch was somewhat __________ rongeur.  Medial compartments were inspected, grade 3-4 chondromalacia, medial femoral condylar __________.  There was a radial tear in posterior medial meniscus __________ motorized shaver, partial meniscectomy was performed back to a stable base and then smoothed down with the cautery system.  Great care __________ articular cartilage.  Attention was directed to the lateral side, __________ femoral condyle.  Mechanical compress __________ stable base.  There was marked degenerative tear in the lateral meniscus with __________ component utilizing basket, motorized shaver, and the cautery system __________ smoothed down nicely.  __________ was sequentially inspected. There were no other abnormalities noted.  __________ scope was removed. __________ were sutured.  Following this, a total of 30 mL of 0.25% Sensorcaine was placed __________ knee block and also with __________ morphine sulfate intraarticular.  Sterile dressing applied __________ no complications or problems.  He was awakened and taken from the operating room to PACU in stable condition.  He will be stabilized in PACU, discharged home.          ______________________________ Cynda Familia, M.D.     RAC/MEDQ  D:  03/29/2014  T:  03/29/2014  Job:  [id number]

## 2014-04-09 ENCOUNTER — Telehealth: Payer: Self-pay | Admitting: *Deleted

## 2014-04-09 ENCOUNTER — Ambulatory Visit: Payer: 59 | Attending: Specialist | Admitting: Physical Therapy

## 2014-04-09 DIAGNOSIS — Z9889 Other specified postprocedural states: Secondary | ICD-10-CM | POA: Diagnosis not present

## 2014-04-09 DIAGNOSIS — M25661 Stiffness of right knee, not elsewhere classified: Secondary | ICD-10-CM

## 2014-04-09 NOTE — Therapy (Signed)
Physical Therapy Evaluation  Patient Details  Name: David Newman MRN: [id number] Date of Birth: February 19, 1961  Encounter Date: 04/09/2014      PT End of Session - 04/09/14 0851    Visit Number 1   Number of Visits 8   Date for PT Re-Evaluation 06/08/14   PT Start Time 0800   PT Stop Time 0843   PT Time Calculation (min) 43 min   Activity Tolerance Patient tolerated treatment well   Behavior During Therapy [facility] for tasks assessed/performed      Past Medical History  Diagnosis Date  . Right knee meniscal tear   . OA (osteoarthritis) of knee   . Hyperlipidemia   . GERD (gastroesophageal reflux disease)   . Wears glasses   . At risk for sleep apnea     STOP-BANG= 5    SENT TO PCP 03-27-2014  . Wears contact lenses     Past Surgical History  Procedure Laterality Date  . Facial reconstruction surgery  1973  age 61  . Knee arthroscopy with anterior cruciate ligament (acl) repair Right 1994  . Wisdom tooth extraction  age 43  . Colonoscopy  04-24-2012  . Knee arthroscopy Right 03/29/2014    Procedure: RIGHT ARTHROSCOPY KNEE WITH DEBRIDMENT PARTIAL medial lateral MENISCECTOMY AND CHONDROPLASTY;  Surgeon: Sydnee Cabal, MD;  Location: [place];  Service: Orthopedics;  Laterality: Right;    There were no vitals taken for this visit.  Visit Diagnosis:  S/P right knee arthroscopy - Plan: PT plan of care cert/re-cert  Knee stiffness, right - Plan: PT plan of care cert/re-cert      Subjective Assessment - 04/09/14 0802    Symptoms Pt is a 53 y/o male who presents to [place] s/p R knee scope on 03/29/14.  Pt reports MD stated Level 4 OA in R knee. Pt reports weakness and residual swelling.   Pertinent History ACL repair in 1994   Limitations Standing;Walking   How long can you stand comfortably? 1 hour   How long can you walk comfortably? 15 min   Patient Stated Goals improve strength; be able to walk a couple miles and return to community fitness   Currently in Pain? Yes   Pain Score 2    Pain Location Knee   Pain Orientation Right   Pain Descriptors / Indicators Sore   Pain Type Surgical pain   Pain Onset 1 to 4 weeks ago   Pain Frequency Intermittent   Aggravating Factors  standing/walking   Pain Relieving Factors sitting/rest   Multiple Pain Sites No          [facility] PT Assessment - 04/09/14 0807    Assessment   Medical Diagnosis R knee scope   Onset Date 03/29/14   Next MD Visit 4 weeks   Prior Therapy n/a   Precautions   Precautions None   Restrictions   Weight Bearing Restrictions Yes   RLE Weight Bearing Weight bearing as tolerated   Balance Screen   Has the patient fallen in the past 6 months Yes   How many times? 1   Has the patient had a decrease in activity level because of a fear of falling?  No   Is the patient reluctant to leave their home because of a fear of falling?  No   Home Environment   Living Enviornment Private residence   Living Arrangements Spouse/significant other;Children   Type of [place] to enter   Entrance  Stairs-Number of Steps 2   Entrance Stairs-Rails None   Home Layout Two level;Bed/bath upstairs   Alternate Level Stairs-Number of Steps 13   Alternate Level Stairs-Rails Right;Left   Home Equipment Crutches   Prior Function   Level of Independence Independent with gait;Independent with transfers;Independent with basic ADLs   Vocation Full time employment   [employer]   Leisure walking   Cognition   Overall Cognitive Status Within Functional Limits for tasks assessed   AROM   Right Knee Extension 5  from neutral   Right Knee Flexion 132   Strength   Right Hip Flexion 4/5   Right Hip Extension 4/5   Right Hip ABduction 4/5   Right Knee Flexion 4/5   Right Knee Extension 3+/5   Special Tests    Special Tests Swelling Tests   Swelling Tests other   other   Comments Circumferential measurements at sup pole patella:  R knee 43 cm; L knee 41 cm   Ambulation/Gait   Ambulation/Gait Yes   Ambulation/Gait Assistance 6: Modified independent (Device/Increase time)   Ambulation Distance (Feet) 150 Feet   Assistive device None   Gait Pattern Antalgic  mild RLE            PT Education - 2014-05-05 0850    Education provided Yes   Education Details HEP for extension   Person(s) Educated Patient   Methods Explanation;Demonstration;Handout   Comprehension Verbalized understanding;Need further instruction            PT Long Term Goals - 2014/05/05 0853    PT LONG TERM GOAL #1   Title independent with HEP (05/07/14)   Time 4   Period Weeks   Status New   PT LONG TERM GOAL #2   Title report pain </= 1/10 with walking > 30 min (05/07/14)   Time 4   Period Weeks   Status New   PT LONG TERM GOAL #3   Title improve R knee extension to 0 degrees for improved motion and functional mobility. (05/07/14)   Time 4   Period Weeks   Status New   PT LONG TERM GOAL #4   Title negotiate stairs reciprocally without gait deviations or increase in pain (05/07/14)   Time 4   Period Weeks   Status New          Plan - 05/05/14 0851    Clinical Impression Statement Pt is a 53 y.o male who presents to [facility] s/p R knee scope with functional mobility, strength, and ROM limitations.  Will benefit from PT to assist in returning to prior level of function.   Pt will benefit from skilled therapeutic intervention in order to improve on the following deficits Abnormal gait;Decreased range of motion;Difficulty walking;Impaired flexibility;Increased edema;Pain;Decreased activity tolerance;Decreased strength   Rehab Potential Good   PT Frequency 2x / week   PT Duration 4 weeks   PT Treatment/Interventions ADLs/Self Care Home Management;Therapeutic activities;Patient/family education;Scar mobilization;Passive range of motion;Therapeutic exercise;Gait training;Ultrasound;Balance training;Manual  techniques;Biofeedback;Cryotherapy;Stair training;Neuromuscular re-education;Compression bandaging;Functional mobility training;Electrical Stimulation   PT Next Visit Plan measure extension; continue extension ROM and strengthening   PT Home Exercise Plan focus on extension currently   Consulted and Agree with Plan of Care Patient          G-Codes - 05/05/2014 0901    Functional Assessment Tool Used Strength RLE: hip flex/abdct/ext all 4/5; knee flex 4/5; knee ext 3+/5      Problem List Patient Active Problem List  Diagnosis Date Noted  . S/P right knee arthroscopy 03/29/2014                                           Laureen Abrahams, PT, DPT 04/09/2014 9:04 AM [address] [phone] (office) [phone] (fax)

## 2014-04-09 NOTE — Telephone Encounter (Signed)
appts made and printed...td 

## 2014-04-09 NOTE — Patient Instructions (Signed)
Knee Extension Mobilization: Towel Prop   With rolled towel under right ankle, place ice pack across knee. Hold _5-10___ minutes. Repeat __1__ times per set. Do _1___ sets per session. Do _2-3___ sessions per day.  Hamstring Step 1   Straighten left knee. Keep knee level with other knee or on bolster. Hold _30__ seconds. Relax knee by returning foot to start. Repeat _2-3__ times.  KNEE: Knee Hang - Prone   Lie on stomach. Place towel above knee; hang feet off surface. Keep feet straight. Hold _30__ seconds. _3__ reps per set, __1_ sets per day.   Sit and Reach - Foot Supported   Remove leg supports. Place one foot on table. Straighten leg and attempt to keep it straight. Hold _5-10__ minutes. Repeat _1__ times each leg, alternating. Do _2-3__ sessions per day.   Copyright  VHI. All rights reserved.   Laureen Abrahams, PT, DPT 04/09/2014 8:31 AM [address] [phone] (office) [phone] (fax)

## 2014-04-15 ENCOUNTER — Ambulatory Visit: Payer: 59 | Admitting: Physical Therapy

## 2014-04-15 ENCOUNTER — Encounter: Payer: Self-pay | Admitting: Physical Therapy

## 2014-04-15 DIAGNOSIS — Z9889 Other specified postprocedural states: Secondary | ICD-10-CM

## 2014-04-15 DIAGNOSIS — M25661 Stiffness of right knee, not elsewhere classified: Secondary | ICD-10-CM

## 2014-04-15 NOTE — Therapy (Signed)
Physical Therapy Treatment  Patient Details  Name: David Newman MRN: [id number] Date of Birth: [birth year]  Encounter Date: 04/15/2014      PT End of Session - 04/15/14 1002    Visit Number 2   Number of Visits 8   Date for PT Re-Evaluation 06/08/14   PT Start Time 0930   PT Stop Time 1601   PT Time Calculation (min) 44 min   Activity Tolerance Patient tolerated treatment well   Behavior During Therapy [facility] for tasks assessed/performed      Past Medical History  Diagnosis Date  . Right knee meniscal tear   . OA (osteoarthritis) of knee   . Hyperlipidemia   . GERD (gastroesophageal reflux disease)   . Wears glasses   . At risk for sleep apnea     STOP-BANG= 5    SENT TO PCP 03-27-2014  . Wears contact lenses     Past Surgical History  Procedure Laterality Date  . Facial reconstruction surgery  1973  age 45  . Knee arthroscopy with anterior cruciate ligament (acl) repair Right 1994  . Wisdom tooth extraction  age 62  . Colonoscopy  04-24-2012  . Knee arthroscopy Right 03/29/2014    Procedure: RIGHT ARTHROSCOPY KNEE WITH DEBRIDMENT PARTIAL medial lateral MENISCECTOMY AND CHONDROPLASTY;  Surgeon: Sydnee Cabal, MD;  Location: [place];  Service: Orthopedics;  Laterality: Right;    There were no vitals taken for this visit.  Visit Diagnosis:  S/P right knee arthroscopy  Knee stiffness, right      Subjective Assessment - 04/15/14 0934    Symptoms R knee a little stiff today. Worked in yard today and knee is more swollen.   Pertinent History ACL repair in 1994   Limitations Standing;Walking   How long can you stand comfortably? 1 1/2 hours   How long can you walk comfortably? 15 min   Patient Stated Goals improve strength; be able to walk a couple miles and return to community fitness   Currently in Pain? Yes   Pain Score 2    Pain Location Knee   Pain Orientation Right   Pain Descriptors / Indicators Sore   Pain Type Surgical pain   Pain Onset 1 to 4 weeks ago   Pain Frequency Intermittent   Aggravating Factors  standing/walking   Pain Relieving Factors sitting/rest            [facility] Adult PT Treatment/Exercise - 04/15/14 0938    Knee/Hip Exercises: Stretches   Passive Hamstring Stretch 3 reps;30 seconds  with strap supine   Knee/Hip Exercises: Aerobic   Stationary Bike NuStep Level 4 x 6 min for ROM   Knee/Hip Exercises: Supine   Quad Sets Strengthening;Right;15 reps  with heel prop   Modalities   Modalities Cryotherapy   Cryotherapy   Number Minutes Cryotherapy 15 Minutes   Cryotherapy Location Knee  right   Type of Cryotherapy Other (comment)  game ready vasopneumatic, mod pressure   Manual Therapy   Manual Therapy Joint mobilization;Other (comment)  scar mobilization   Joint Mobilization R tibial/femur A/P glides grade 3 for extension   Other Manual Therapy scar mobilization          PT Education - 04/15/14 1002    Education provided Yes   Education Details scar mobilization   Person(s) Educated Patient   Methods Explanation;Demonstration;Handout   Comprehension Verbalized understanding;Need further instruction            PT Long Term Goals -  04/15/14 1004    PT LONG TERM GOAL #1   Title independent with HEP (05/07/14)   Time 4   Period Weeks   Status On-going   PT LONG TERM GOAL #2   Title report pain </= 1/10 with walking > 30 min (05/07/14)   Time 4   Period Weeks   Status On-going   PT LONG TERM GOAL #3   Title improve R knee extension to 0 degrees for improved motion and functional mobility. (05/07/14)   Time 4   Period Weeks   Status On-going   PT LONG TERM GOAL #4   Title negotiate stairs reciprocally without gait deviations or increase in pain (05/07/14)   Time 4   Period Weeks   Status On-going          Plan - 04/15/14 1003    Clinical Impression Statement Pt reports limited compliance with HEP over holiday weekend.  Pt will continue to benefit from PT to  maximize functional mobility.   PT Next Visit Plan measure extension, ROM and strengthening   PT Home Exercise Plan focus on extension currently   Consulted and Agree with Plan of Care Patient        Problem List Patient Active Problem List   Diagnosis Date Noted  . S/P right knee arthroscopy 03/29/2014                                            Laureen Abrahams, PT, DPT 04/15/2014 10:14 AM [address] [phone] (office) [phone] (fax)

## 2014-04-17 ENCOUNTER — Ambulatory Visit: Payer: 59 | Attending: Specialist | Admitting: Physical Therapy

## 2014-04-17 DIAGNOSIS — Z9889 Other specified postprocedural states: Secondary | ICD-10-CM | POA: Insufficient documentation

## 2014-04-17 DIAGNOSIS — M25661 Stiffness of right knee, not elsewhere classified: Secondary | ICD-10-CM | POA: Insufficient documentation

## 2014-04-17 NOTE — Therapy (Signed)
Outpatient Rehabilitation [facility] [address] [place], [place], [address] Phone: [phone]   Fax:  [phone]  Physical Therapy Treatment  Patient Details  Name: David Newman MRN: [id number] Date of Birth: [birth year]  Encounter Date: 04/17/2014      PT End of Session - 04/17/14 0847    Visit Number 3   Number of Visits 8   Date for PT Re-Evaluation 06/08/14   PT Start Time 0810   PT Stop Time 0840   PT Time Calculation (min) 30 min   Activity Tolerance Patient tolerated treatment well      Past Medical History  Diagnosis Date  . Right knee meniscal tear   . OA (osteoarthritis) of knee   . Hyperlipidemia   . GERD (gastroesophageal reflux disease)   . Wears glasses   . At risk for sleep apnea     STOP-BANG= 5    SENT TO PCP 03-27-2014  . Wears contact lenses     Past Surgical History  Procedure Laterality Date  . Facial reconstruction surgery  1973  age 57  . Knee arthroscopy with anterior cruciate ligament (acl) repair Right 1994  . Wisdom tooth extraction  age 52  . Colonoscopy  04-24-2012  . Knee arthroscopy Right 03/29/2014    Procedure: RIGHT ARTHROSCOPY KNEE WITH DEBRIDMENT PARTIAL medial lateral MENISCECTOMY AND CHONDROPLASTY;  Surgeon: Sydnee Cabal, MD;  Location: [place];  Service: Orthopedics;  Laterality: Right;    There were no vitals taken for this visit.  Visit Diagnosis:  S/P right knee arthroscopy  Knee stiffness, right      Subjective Assessment - 04/17/14 0810    Symptoms First steps after sitting stiff, better with moving around.  Hamstring, lateral insertion area sore.   Currently in Pain? Yes   Pain Score 2    Pain Location Knee   Pain Orientation Right   Pain Descriptors / Indicators --  Stiff   Pain Type Surgical pain   Pain Onset 1 to 4 weeks ago   Pain Frequency Intermittent   Aggravating Factors  getting up from sitting, swelling   Pain Relieving Factors ice, heat   Multiple Pain  Sites No            [facility] Adult PT Treatment/Exercise - 04/17/14 0810    Knee/Hip Exercises: Stretches   Active Hamstring Stretch Limitations --  -3, visually measured.   Gastroc Stretch 3 reps;30 seconds   Knee/Hip Exercises: Aerobic   Stationary Bike nustep, 4 minutes level 4   Knee/Hip Exercises: Standing   Other Standing Knee Exercises Stand Rt moving Lt 3 directions 10 reps, Stand by   Other Standing Knee Exercises Hinge, needs instructions   Knee/Hip Exercises: Supine   Quad Sets 1 set  With manual    Modalities   Modalities Moist Heat;Cryotherapy  Cold anterion, heat posterior   Manual Therapy   Manual Therapy --  For extension              PT Long Term Goals - 04/17/14 8563    PT LONG TERM GOAL #1   Title independent with HEP (05/07/14)   Time 4   Period Weeks   Status On-going   PT LONG TERM GOAL #2   Title report pain </= 1/10 with walking > 30 min (05/07/14)   Time 4   Period Weeks   Status On-going   PT LONG TERM GOAL #3   Title improve R knee extension to 0 degrees for improved  motion and functional mobility. (05/07/14)   Time 4   Period Weeks   Status On-going   PT LONG TERM GOAL #4   Title negotiate stairs reciprocally without gait deviations or increase in pain (05/07/14)   Time 4   Period Weeks   Status On-going          Plan - 04/17/14 0847    Clinical Impression Statement Patient function improving, extension improving.. Progress toward those goals.   PT Next Visit Plan continue maximize strength, closed chain, pain free,  heat posterior knee helpful.                               Problem List Patient Active Problem List   Diagnosis Date Noted  . S/P right knee arthroscopy 03/29/2014    David Newman,David Newman 04/17/2014, 8:58 AM

## 2014-04-17 NOTE — Therapy (Signed)
Outpatient Rehabilitation [facility] [address] [place], [place], [address] Phone: [phone]   Fax:  [phone]  Physical Therapy Treatment  Patient Details  Name: David Newman MRN: [id number] Date of Birth: [birth year]  Encounter Date: 04/17/2014      PT End of Session - 04/17/14 0847    Visit Number 3   Number of Visits 8   Date for PT Re-Evaluation 06/08/14   PT Start Time 0810   PT Stop Time 0840   PT Time Calculation (min) 30 min   Activity Tolerance Patient tolerated treatment well      Past Medical History  Diagnosis Date  . Right knee meniscal tear   . OA (osteoarthritis) of knee   . Hyperlipidemia   . GERD (gastroesophageal reflux disease)   . Wears glasses   . At risk for sleep apnea     STOP-BANG= 5    SENT TO PCP 03-27-2014  . Wears contact lenses     Past Surgical History  Procedure Laterality Date  . Facial reconstruction surgery  1973  age 48  . Knee arthroscopy with anterior cruciate ligament (acl) repair Right 1994  . Wisdom tooth extraction  age 71  . Colonoscopy  04-24-2012  . Knee arthroscopy Right 03/29/2014    Procedure: RIGHT ARTHROSCOPY KNEE WITH DEBRIDMENT PARTIAL medial lateral MENISCECTOMY AND CHONDROPLASTY;  Surgeon: Sydnee Cabal, MD;  Location: [place];  Service: Orthopedics;  Laterality: Right;    There were no vitals taken for this visit.  Visit Diagnosis:  S/P right knee arthroscopy  Knee stiffness, right          [facility] Adult PT Treatment/Exercise - 04/17/14 0810    Knee/Hip Exercises: Stretches   Active Hamstring Stretch Limitations --  -3, visually measured.   Gastroc Stretch 3 reps;30 seconds   Knee/Hip Exercises: Aerobic   Stationary Bike nustep, 4 minutes level 4   Knee/Hip Exercises: Standing   Other Standing Knee Exercises Stand Rt moving Lt 3 directions 10 reps, Stand by   Other Standing Knee Exercises Hinge, needs instructions   Knee/Hip Exercises: Supine   Quad  Sets 1 set  With manual    Modalities   Modalities Moist Heat;Cryotherapy  Cold anterion, heat posterior   Manual Therapy   Manual Therapy --  For extension              PT Long Term Goals - 04/17/14 6384    PT LONG TERM GOAL #1   Title independent with HEP (05/07/14)   Time 4   Period Weeks   Status On-going   PT LONG TERM GOAL #2   Title report pain </= 1/10 with walking > 30 min (05/07/14)   Time 4   Period Weeks   Status On-going   PT LONG TERM GOAL #3   Title improve R knee extension to 0 degrees for improved motion and functional mobility. (05/07/14)   Time 4   Period Weeks   Status On-going   PT LONG TERM GOAL #4   Title negotiate stairs reciprocally without gait deviations or increase in pain (05/07/14)   Time 4   Period Weeks   Status On-going          Plan - 04/17/14 0847    Clinical Impression Statement Patient function improving, extension improving.. Progress toward those goals.   PT Next Visit Plan continue maximize strength, closed chain, pain free,  heat posterior knee helpful.  Problem List Patient Active Problem List   Diagnosis Date Noted  . S/P right knee arthroscopy 03/29/2014  Melvenia Needles, PTA 04/17/2014 8:53 AM Phone: [phone] Fax: [phone]   [facility] 04/17/2014, 8:53 AM

## 2014-04-22 ENCOUNTER — Ambulatory Visit: Payer: 59 | Admitting: Physical Therapy

## 2014-04-22 DIAGNOSIS — M25661 Stiffness of right knee, not elsewhere classified: Secondary | ICD-10-CM

## 2014-04-22 DIAGNOSIS — Z9889 Other specified postprocedural states: Secondary | ICD-10-CM

## 2014-04-22 NOTE — Therapy (Signed)
Outpatient Rehabilitation [facility] [address] [place], [place], [address] Phone: [phone]   Fax:  [phone]  Physical Therapy Treatment  Patient Details  Name: David Newman MRN: [id number] Date of Birth: 1960-06-15  Encounter Date: 04/22/2014      PT End of Session - 04/22/14 0919    Visit Number 4   Number of Visits 8   Date for PT Re-Evaluation 06/08/14   PT Start Time 0850   PT Stop Time 0933   PT Time Calculation (min) 43 min   Activity Tolerance Patient tolerated treatment well   Behavior During Therapy [facility] for tasks assessed/performed      Past Medical History  Diagnosis Date  . Right knee meniscal tear   . OA (osteoarthritis) of knee   . Hyperlipidemia   . GERD (gastroesophageal reflux disease)   . Wears glasses   . At risk for sleep apnea     STOP-BANG= 5    SENT TO PCP 03-27-2014  . Wears contact lenses     Past Surgical History  Procedure Laterality Date  . Facial reconstruction surgery  1973  age 15  . Knee arthroscopy with anterior cruciate ligament (acl) repair Right 1994  . Wisdom tooth extraction  age 23  . Colonoscopy  04-24-2012  . Knee arthroscopy Right 03/29/2014    Procedure: RIGHT ARTHROSCOPY KNEE WITH DEBRIDMENT PARTIAL medial lateral MENISCECTOMY AND CHONDROPLASTY;  Surgeon: Sydnee Cabal, MD;  Location: [place];  Service: Orthopedics;  Laterality: Right;    There were no vitals taken for this visit.  Visit Diagnosis:  S/P right knee arthroscopy  Knee stiffness, right      Subjective Assessment - 04/22/14 0851    Symptoms R knee a little sore, may have over done it over weekend.   Pertinent History ACL repair in 1994   Limitations Standing;Walking   How long can you stand comfortably? 1 1/2 hours   How long can you walk comfortably? 15 min   Patient Stated Goals improve strength; be able to walk a couple miles and return to community fitness   Currently in Pain? No/denies   Pain  Location Knee   Pain Orientation Right   Pain Descriptors / Indicators Sore   Pain Type Surgical pain   Pain Onset 1 to 4 weeks ago   Pain Frequency Intermittent   Aggravating Factors  getting up after prolonged sitting; swelling   Pain Relieving Factors ice, heat            [facility] Adult PT Treatment/Exercise - 04/22/14 0853    Knee/Hip Exercises: Aerobic   Stationary Bike Level 4 x 8 min   Knee/Hip Exercises: Machines for Strengthening   Cybex Leg Press 100# 2x15 bil LEs; 40# RLE only 2x15 reps   Knee/Hip Exercises: Standing   SLS with ball toss to rebounder with and without compliant surface   SLS with Vectors minisquats on RLE to targets   Knee/Hip Exercises: Supine   Heel Prop for Knee Extension Other (comment)  with ice pack x 15 min   Modalities   Modalities Cryotherapy   Cryotherapy   Number Minutes Cryotherapy 15 Minutes   Cryotherapy Location Knee  Right with heel prop   Type of Cryotherapy Ice pack          PT Education - 04/22/14 0919    Education provided No            PT Long Term Goals - 04/22/14 0920    PT  LONG TERM GOAL #1   Title independent with HEP (05/07/14)   Time 4   Period Weeks   Status On-going   PT LONG TERM GOAL #2   Title report pain </= 1/10 with walking > 30 min (05/07/14)   Time 4   Period Weeks   Status On-going   PT LONG TERM GOAL #3   Title improve R knee extension to 0 degrees for improved motion and functional mobility. (05/07/14)   Time 4   Period Weeks   Status On-going   PT LONG TERM GOAL #4   Title negotiate stairs reciprocally without gait deviations or increase in pain (05/07/14)   Time 4   Period Weeks   Status On-going          Plan - 04/22/14 0919    Clinical Impression Statement Overall functional mobility and strength improving; continuing to work towards ROM goals.   PT Next Visit Plan continue strengthening and focus on extension   PT Home Exercise Plan focus on extension currently   Consulted  and Agree with Plan of Care Patient                               Problem List Patient Active Problem List   Diagnosis Date Noted  . S/P right knee arthroscopy 03/29/2014   Laureen Abrahams, PT, DPT 04/22/2014 10:09 AM  [facility] [address], [place] [address]  [phone] (office) [phone] (fax)

## 2014-04-24 ENCOUNTER — Ambulatory Visit: Payer: 59 | Admitting: Rehabilitation

## 2014-04-24 DIAGNOSIS — M25661 Stiffness of right knee, not elsewhere classified: Secondary | ICD-10-CM

## 2014-04-24 DIAGNOSIS — Z9889 Other specified postprocedural states: Secondary | ICD-10-CM

## 2014-04-24 NOTE — Therapy (Signed)
Outpatient Rehabilitation [facility] [address] [place], [place], [address] Phone: [phone]   Fax:  [phone]  Physical Therapy Treatment  Patient Details  Name: David Newman MRN: [id number] Date of Birth: [birth year]  Encounter Date: 04/24/2014      PT End of Session - 04/24/14 0940    Visit Number 5   Number of Visits 8   Date for PT Re-Evaluation 06/08/14   PT Start Time 7342   PT Stop Time 0945   PT Time Calculation (min) 58 min      Past Medical History  Diagnosis Date  . Right knee meniscal tear   . OA (osteoarthritis) of knee   . Hyperlipidemia   . GERD (gastroesophageal reflux disease)   . Wears glasses   . At risk for sleep apnea     STOP-BANG= 5    SENT TO PCP 03-27-2014  . Wears contact lenses     Past Surgical History  Procedure Laterality Date  . Facial reconstruction surgery  1973  age 53  . Knee arthroscopy with anterior cruciate ligament (acl) repair Right 1994  . Wisdom tooth extraction  age 53  . Colonoscopy  04-24-2012  . Knee arthroscopy Right 03/29/2014    Procedure: RIGHT ARTHROSCOPY KNEE WITH DEBRIDMENT PARTIAL medial lateral MENISCECTOMY AND CHONDROPLASTY;  Surgeon: Sydnee Cabal, MD;  Location: [place];  Service: Orthopedics;  Laterality: Right;    There were no vitals taken for this visit.  Visit Diagnosis:  S/P right knee arthroscopy  Knee stiffness, right      Subjective Assessment - 04/24/14 0851    Symptoms no pain this morning, walked the dogs and have been up and down the stairs a couple times this morning   Pertinent History ACL repair in 1994   Patient Stated Goals improve strength; be able to walk a couple miles and return to community fitness   Currently in Pain? No/denies            [facility] Adult PT Treatment/Exercise - 04/24/14 0857    Knee/Hip Exercises: Stretches   Active Hamstring Stretch 3 reps;30 seconds   Gastroc Stretch 3 reps;30 seconds   Knee/Hip Exercises:  Aerobic   Stationary Bike Level 4 x 8 min   Knee/Hip Exercises: Machines for Strengthening   Cybex Leg Press 100# x15 parallel, x 15 wide with ER, 40# single leg x15   Knee/Hip Exercises: Standing   Terminal Knee Extension Strengthening;15 reps;Theraband  3 ways   Theraband Level (Terminal Knee Extension) Level 4 (Blue)   Rebounder SLS 20 toss without LOB   Knee/Hip Exercises: Supine   Terminal Knee Extension AROM;20 reps  with heel prop   Straight Leg Raises 10 reps   Knee/Hip Exercises: Sidelying   Hip ABduction 10 reps   Knee/Hip Exercises: Prone   Hip Extension 10 reps   Modalities   Modalities Cryotherapy   Cryotherapy   Number Minutes Cryotherapy 15 Minutes   Cryotherapy Location Knee   Type of Cryotherapy --  vaso medium pressure                Plan - 04/24/14 0940    Clinical Impression Statement progressing toward all remaining goals, pt with limited compliance with HEP due to overtime at job this week   PT Next Visit Plan continue strengthening and focus on extension       Problem List Patient Active Problem List   Diagnosis Date Noted  . S/P right knee arthroscopy 03/29/2014  Dorene Ar, PTA 04/24/2014, 9:42 AM

## 2014-04-29 ENCOUNTER — Ambulatory Visit: Payer: 59 | Admitting: Physical Therapy

## 2014-04-29 DIAGNOSIS — M25661 Stiffness of right knee, not elsewhere classified: Secondary | ICD-10-CM

## 2014-04-29 DIAGNOSIS — Z9889 Other specified postprocedural states: Secondary | ICD-10-CM | POA: Diagnosis not present

## 2014-04-29 NOTE — Therapy (Addendum)
Outpatient Rehabilitation [facility] [address] [place], [place], [address] Phone: [phone]   Fax:  [phone]  Physical Therapy Treatment  Patient Details  Name: David Newman MRN: [id number] Date of Birth: [birth year]  Encounter Date: 04/29/2014      PT End of Session - 04/29/14 1408    PT Start Time 0850   PT Stop Time 0950   PT Time Calculation (min) 60 min   Activity Tolerance Patient tolerated treatment well      Past Medical History  Diagnosis Date  . Right knee meniscal tear   . OA (osteoarthritis) of knee   . Hyperlipidemia   . GERD (gastroesophageal reflux disease)   . Wears glasses   . At risk for sleep apnea     STOP-BANG= 5    SENT TO PCP 03-27-2014  . Wears contact lenses     Past Surgical History  Procedure Laterality Date  . Facial reconstruction surgery  1973  age 48  . Knee arthroscopy with anterior cruciate ligament (acl) repair Right 1994  . Wisdom tooth extraction  age 59  . Colonoscopy  04-24-2012  . Knee arthroscopy Right 03/29/2014    Procedure: RIGHT ARTHROSCOPY KNEE WITH DEBRIDMENT PARTIAL medial lateral MENISCECTOMY AND CHONDROPLASTY;  Surgeon: Sydnee Cabal, MD;  Location: [place];  Service: Orthopedics;  Laterality: Right;    There were no vitals taken for this visit.  Visit Diagnosis:  S/P right knee arthroscopy  Knee stiffness, right      Subjective Assessment - 04/29/14 0908    Symptoms Stiff and sore from activities over the weekend.  Walking, [place].   Currently in Pain? Yes   Pain Score 2    Pain Location Knee   Pain Orientation Posterior;Medial   Pain Descriptors / Indicators Aching;Sore  Stiff   Pain Type Surgical pain   Pain Onset 1 to 4 weeks ago   Pain Frequency Intermittent   Aggravating Factors  Increased activity   Pain Relieving Factors ice, heat   Effect of Pain on Daily Activities increases   Multiple Pain Sites No            [facility] Adult PT  Treatment/Exercise - 04/29/14 0900    Knee/Hip Exercises: Stretches   Gastroc Stretch 3 reps;30 seconds  each   Knee/Hip Exercises: Machines for Strengthening   Cybex Knee Flexion --  45 LBS flexion eccentric , 2 leg flex, 1 leg eccentric 15   Total Gym Leg Press --  45 LBS 15 reps 1 leg   Knee/Hip Exercises: Standing   Heel Raises 2 sets   Side Lunges 15 reps  Lt foot on towel, sliding, SBA   Terminal Knee Extension 1 set  against ball at wall   SLS with Vectors --  Towel slide forward, and extension 15 reps, Stand by.   Cryotherapy   Number Minutes Cryotherapy 10 Minutes   Cryotherapy Location Knee   Pelvic Floor Biofeedback   Number of Minutes --  Posterior, medial   Manual Therapy   Other Manual Therapy --  retrograde, joint mobilization for extension.              PT Long Term Goals - 04/29/14 1411    PT LONG TERM GOAL #1   Title independent with HEP (05/07/14)   Time 4   Period Weeks   Status On-going   PT LONG TERM GOAL #2   Title report pain </= 1/10 with walking > 30 min (05/07/14)   Time  4   Period Weeks   Status On-going   PT LONG TERM GOAL #3   Title improve R knee extension to 0 degrees for improved motion and functional mobility. (05/07/14)   Time 4   Period Weeks   Status On-going   PT LONG TERM GOAL #4   Time 4   Period Weeks   Status On-going          Plan - 04/29/14 1409    Clinical Impression Statement Rom for extension improved from -10 degrees to  about 3 degrees.  Back of knee touching mat after soft tissue work and gentle joint mobilization.   PT Next Visit Plan strengthening, extension.                               Problem List Patient Active Problem List   Diagnosis Date Noted  . S/P right knee arthroscopy 03/29/2014  Melvenia Needles, PTA 04/29/2014 2:13 PM Phone: [phone] Fax: [phone]   [facility] 04/29/2014, 2:13 PM     PHYSICAL THERAPY DISCHARGE SUMMARY  Visits from Start  of Care: 6  Current functional level related to goals / functional outcomes: See above; goals not formally assessed as pt did not return   Remaining deficits: See above   Education / Equipment: HEP  Plan: Patient agrees to discharge.  Patient goals were not met. Patient is being discharged due to not returning since the last visit.  ?????    Laureen Abrahams, PT, DPT 07/19/2014 8:26 AM  [facility] [address] [place] [address]  [phone] (office) [phone] (fax)

## 2014-05-06 ENCOUNTER — Ambulatory Visit: Payer: 59 | Admitting: Physical Therapy

## 2014-05-08 ENCOUNTER — Ambulatory Visit: Payer: 59 | Admitting: Physical Therapy

## 2015-05-13 ENCOUNTER — Ambulatory Visit (INDEPENDENT_AMBULATORY_CARE_PROVIDER_SITE_OTHER): Payer: 59 | Admitting: Internal Medicine

## 2015-05-13 ENCOUNTER — Encounter: Payer: Self-pay | Admitting: Internal Medicine

## 2015-05-13 VITALS — BP 138/80 | HR 80 | Ht 71.25 in | Wt 225.1 lb

## 2015-05-13 DIAGNOSIS — K219 Gastro-esophageal reflux disease without esophagitis: Secondary | ICD-10-CM | POA: Diagnosis not present

## 2015-05-13 DIAGNOSIS — K625 Hemorrhage of anus and rectum: Secondary | ICD-10-CM | POA: Diagnosis not present

## 2015-05-13 DIAGNOSIS — R194 Change in bowel habit: Secondary | ICD-10-CM

## 2015-05-13 NOTE — Patient Instructions (Signed)
You have been scheduled for a colonoscopy. Please follow written instructions given to you at your visit today.  If you use inhalers (even only as needed), please bring them with you on the day of your procedure.

## 2015-05-13 NOTE — Progress Notes (Signed)
HISTORY OF PRESENT ILLNESS:  David Newman is a 54 y.o. 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 Tollie Pizza with chief complaint of change in bowel habits and rectal bleeding. The patient underwent routine screening colonoscopy December 2013. There is a family history of colon polyps in his brother and colon cancer in a cousin. Examination was performed by Dr. Verl Blalock. Patient was found to have 2 sigmoid colon polyps which were cold biopsied and found to be hyperplastic. No other abnormalities. Routine follow-up in 10 years recommended. Patient reports being in his usual state of health until April 2016 when he began to notice increased frequency of bowel movements with looser consistency. Intermittent problems with blood and mucus as well as blood clots. There was mild cramping and urgency. He was evaluated by his primary care provider and told to keep an eye on things for a few months. Patient did have a history remotely of anal fissure, but denies any rectal pain. His weight has been stable. GI review of systems is otherwise remarkable for rare episodes of heartburn with dietary indiscretion for which she takes on demand and antacids. No dysphagia.  REVIEW OF SYSTEMS:  All non-GI ROS negative except for sinus allergy trouble, sore throat, hematuria  Past Medical History  Diagnosis Date  . Right knee meniscal tear   . OA (osteoarthritis) of knee   . Hyperlipidemia   . GERD (gastroesophageal reflux disease)   . Wears glasses   . At risk for sleep apnea     STOP-BANG= 5    SENT TO PCP 03-27-2014  . Wears contact lenses   . Anal fissure   . Colon polyps     hyperplastic  . CAD (coronary artery disease)     Past Surgical History  Procedure Laterality Date  . Facial reconstruction surgery  1973  age 24  . Knee arthroscopy with anterior cruciate ligament (acl) repair Right 1994  . Wisdom tooth extraction  age 73  . Colonoscopy  04-24-2012  . Knee  arthroscopy Right 03/29/2014    Procedure: RIGHT ARTHROSCOPY KNEE WITH DEBRIDMENT PARTIAL medial lateral MENISCECTOMY AND CHONDROPLASTY;  Surgeon: Sydnee Cabal, MD;  Location: [facility];  Service: Orthopedics;  Laterality: Right;    Social History David Newman  reports that he quit smoking about 8 years ago. His smoking use included Cigarettes. He has a 35 pack-year smoking history. He has never used smokeless tobacco. He reports that he drinks alcohol. He reports that he does not use illicit drugs.  family history includes Alzheimer's disease in his mother; Colon cancer (age of onset: 66) in his cousin; Colon cancer (age of onset: 54) in his paternal uncle; Colon polyps (age of onset: 45) in his brother; Heart disease in his brother and father; Skin cancer in his father. There is no history of Stomach cancer.  Allergies  Allergen Reactions  . Sulfa Antibiotics Nausea And Vomiting       PHYSICAL EXAMINATION: Vital signs: BP 138/80 mmHg  Pulse 80  Ht 5' 11.25" (1.81 m)  Wt 225 lb 2 oz (102.116 kg)  BMI 31.17 kg/m2  Constitutional: generally well-appearing, no acute distress Psychiatric: alert and oriented x3, cooperative Eyes: extraocular movements intact, anicteric, conjunctiva pink Mouth: oral pharynx moist, no lesions Neck: supple no lymphadenopathy Cardiovascular: heart regular rate and rhythm, no murmur Lungs: clear to auscultation bilaterally Abdomen: soft, nontender, nondistended, no obvious ascites, no peritoneal signs, normal bowel sounds, no organomegaly  Rectal: Deferred until colonoscopy Extremities: no clubbing cyanosis or lower extremity edema bilaterally Skin: no lesions on visible extremities Neuro: No focal deficits. Cranial nerves intact.   ASSESSMENT:  #58. 54 year old gentleman with a month history of change in bowel habits manifested by increased frequency, urgency, and looser consistency. In addition passage of mucus and blood with  clots at times. 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 Cannot exclude interval neoplasia or inflamed internal hemorrhoids. Needs evaluated #2. Mild GERD without alarm features   PLAN:  #1. Colonoscopy.The nature of the procedure, as well as the risks, benefits, and alternatives were carefully and thoroughly reviewed with the patient. Ample time for discussion and questions allowed. The patient understood, was satisfied, and agreed to proceed. #2. Reflux precautions. On demand and antacids  A copy of this consultation and has been sent to Dr. Tollie Pizza

## 2015-05-15 ENCOUNTER — Encounter: Payer: 59 | Admitting: Internal Medicine

## 2015-05-15 ENCOUNTER — Encounter: Payer: Self-pay | Admitting: Internal Medicine

## 2015-05-15 ENCOUNTER — Ambulatory Visit (AMBULATORY_SURGERY_CENTER): Payer: 59 | Admitting: Internal Medicine

## 2015-05-15 VITALS — BP 130/86 | HR 63 | Temp 98.0°F | Resp 21 | Ht 71.0 in | Wt 225.0 lb

## 2015-05-15 DIAGNOSIS — K51311 Ulcerative (chronic) rectosigmoiditis with rectal bleeding: Secondary | ICD-10-CM | POA: Diagnosis not present

## 2015-05-15 DIAGNOSIS — K625 Hemorrhage of anus and rectum: Secondary | ICD-10-CM | POA: Diagnosis present

## 2015-05-15 DIAGNOSIS — K529 Noninfective gastroenteritis and colitis, unspecified: Secondary | ICD-10-CM | POA: Diagnosis not present

## 2015-05-15 MED ORDER — PREDNISONE 20 MG PO TABS: 20.0000 mg | ORAL_TABLET | Freq: Every day | ORAL | Status: DC

## 2015-05-15 MED ORDER — SODIUM CHLORIDE 0.9 % IV SOLN: 500.0000 mL | INTRAVENOUS | Status: DC

## 2015-05-15 MED ORDER — MESALAMINE 1.2 G PO TBEC: 4.8000 g | DELAYED_RELEASE_TABLET | Freq: Every day | ORAL | Status: DC

## 2015-05-15 MED ORDER — MESALAMINE 4 G RE ENEM: 4.0000 g | ENEMA | Freq: Every day | RECTAL | Status: DC

## 2015-05-15 NOTE — Progress Notes (Signed)
Called to room to assist during endoscopic procedure.  Patient ID and intended procedure confirmed with present staff. Received instructions for my participation in the procedure from the performing physician.  

## 2015-05-15 NOTE — Op Note (Signed)
[place]  [address] [place], [address]   COLONOSCOPY PROCEDURE REPORT  PATIENT: David Newman, David Newman  MR#: [id number] BIRTHDATE: Mar 27, 1961 , 59  yrs. old GENDER: male ENDOSCOPIST: Eustace Quail, MD REFERRED BY:.  Self / Office PROCEDURE DATE:  05/15/2015 PROCEDURE:   Colonoscopy with biopsy  ASA CLASS:   Class II INDICATIONS:rectal bleeding and change in bowel habits. MEDICATIONS: Monitored anesthesia care and Propofol 450 mg IV  DESCRIPTION OF PROCEDURE:   After the risks benefits and alternatives of the procedure were thoroughly explained, informed consent was obtained.  The digital rectal exam revealed no abnormalities of the rectum.   The LB [id number]  endoscope was introduced through the anus and advanced to the cecum, which was identified by both the appendix and ileocecal valve. No adverse events experienced.   The quality of the prep was adequate (Suprep was used)  The instrument was then slowly withdrawn as the colon was fully examined. Estimated blood loss is zero unless otherwise noted in this procedure report.  COLON FINDINGS: The colonoscope was advanced to the cecal tip.  The ileum was normal.  The cecum, right colon, transverse colon, descending colon, and proximal sigmoid colon were normal.  Biopsies from each section taken.  There was a moderate confluent acute colitis beginning at the anal verge and extending to 25 cm.  This was endoscopically consistent with ulcerative colitis.  Multiple biopsies taken.  Retroflexion was not performed due to acute inflammation (but the rectum was carefully inspected from the anal verge). The time to cecum =    Withdrawal time =      The scope was withdrawn and the procedure completed. COMPLICATIONS: There were no immediate complications.  ENDOSCOPIC IMPRESSION: 1. 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 Multiple biopsies taken 2. Normal proximal colon. Multiple  biopsies taken 3. Normal ileum  RECOMMENDATIONS: 1. Await biopsy results  . Dr. Henrene Pastor will send you a letter with results 2. Initiate Lialda 4.8 g daily; 1.2 g tablets; dispense #120; take 4 by mouth daily; 11 refills 3. Initiate Rowasa enemas; 4 g; #30; one per rectum at night. Hold for 30 minutes of possible; 11 refills 4. Initiate prednisone 20 mg daily; #30; 1 by mouth daily; 3 refills 5. Office follow-up with Dr. Henrene Pastor in about 3-4 weeks  eSigned:  Eustace Quail, MD 05/15/2015 3:55 PM   cc: The Patient and Juanita Craver, MD

## 2015-05-15 NOTE — Progress Notes (Signed)
Patient awakening,vss,report to rn 

## 2015-05-15 NOTE — Progress Notes (Signed)
No problems noted in the recovery room. maw 

## 2015-05-15 NOTE — Patient Instructions (Addendum)
YOU HAD AN ENDOSCOPIC PROCEDURE TODAY AT [facility]:   Refer to the procedure report that was given to you for any specific questions about what was found during the examination.  If the procedure report does not answer your questions, please call your gastroenterologist to clarify.  If you requested that your care partner not be given the details of your procedure findings, then the procedure report has been included in a sealed envelope for you to review at your convenience later.  YOU SHOULD EXPECT: Some feelings of bloating in the abdomen. Passage of more gas than usual.  Walking can help get rid of the air that was put into your GI tract during the procedure and reduce the bloating. If you had a lower endoscopy (such as a colonoscopy or flexible sigmoidoscopy) you may notice spotting of blood in your stool or on the toilet paper. If you underwent a bowel prep for your procedure, you may not have a normal bowel movement for a few days.  Please Note:  You might notice some irritation and congestion in your nose or some drainage.  This is from the oxygen used during your procedure.  There is no need for concern and it should clear up in a day or so.  SYMPTOMS TO REPORT IMMEDIATELY:   Following lower endoscopy (colonoscopy or flexible sigmoidoscopy):  Excessive amounts of blood in the stool  Significant tenderness or worsening of abdominal pains  Swelling of the abdomen that is new, acute  Fever of 100F or higher    For urgent or emergent issues, a gastroenterologist can be reached at any hour by calling [phone].   DIET: Your first meal following the procedure should be a small meal and then it is ok to progress to your normal diet. Heavy or fried foods are harder to digest and may make you feel nauseous or bloated.  Likewise, meals heavy in dairy and vegetables can increase bloating.  Drink plenty of fluids but you should avoid alcoholic beverages for 24  hours.  ACTIVITY:  You should plan to take it easy for the rest of today and you should NOT DRIVE or use heavy machinery until tomorrow (because of the sedation medicines used during the test).    FOLLOW UP: Our staff will call the number listed on your records the next business day following your procedure to check on you and address any questions or concerns that you may have regarding the information given to you following your procedure. If we do not reach you, we will leave a message.  However, if you are feeling well and you are not experiencing any problems, there is no need to return our call.  We will assume that you have returned to your regular daily activities without incident.  If any biopsies were taken you will be contacted by phone or by letter within the next 1-3 weeks.  Please call us at [phone] if you have not heard about the biopsies in 3 weeks.    SIGNATURES/CONFIDENTIALITY: You and/or your care partner have signed paperwork which will be entered into your electronic medical record.  These signatures attest to the fact that that the information above on your After Visit Summary has been reviewed and is understood.  Full responsibility of the confidentiality of this discharge information lies with you and/or your care-partner.  Call for follow up appointment with Dr. Henrene Pastor in 3-4 weeks. Rx sent to [facility]. Handouts were given to  your care partner on ulcerative colitis. You may resume your current medications today. Await biopsy results. Please call if any questions or concerns.

## 2015-05-16 ENCOUNTER — Telehealth: Payer: Self-pay | Admitting: *Deleted

## 2015-05-16 NOTE — Telephone Encounter (Signed)
  Follow up Call-  Call back number 05/15/2015  Post procedure Call Back phone  # [phone]  Permission to leave phone message Yes     Patient questions:  Do you have a fever, pain , or abdominal swelling? No. Pain Score  0   Have you tolerated food without any problems? Yes.    Have you been able to return to your normal activities? Yes.    Do you have any questions about your discharge instructions: Diet   No. Medications  No. Follow up visit  No.  Do you have questions or concerns about your Care? No.  Actions: * If pain score is 4 or above: No action needed, pain <4.

## 2015-06-06 ENCOUNTER — Encounter: Payer: Self-pay | Admitting: Internal Medicine

## 2015-06-09 ENCOUNTER — Telehealth: Payer: Self-pay | Admitting: Internal Medicine

## 2015-06-09 NOTE — Telephone Encounter (Signed)
Discussed with pt that there are 3 refills on the prednisone prescription and he should refill the script and can discuss possible taper with Dr. Henrene Pastor at [facility]. Pt verbalized understanding.

## 2015-06-13 ENCOUNTER — Encounter: Payer: Self-pay | Admitting: Internal Medicine

## 2015-06-13 ENCOUNTER — Ambulatory Visit (INDEPENDENT_AMBULATORY_CARE_PROVIDER_SITE_OTHER): Payer: 59 | Admitting: Internal Medicine

## 2015-06-13 VITALS — BP 128/68 | HR 70 | Ht 71.25 in | Wt 218.0 lb

## 2015-06-13 DIAGNOSIS — K5130 Ulcerative (chronic) rectosigmoiditis without complications: Secondary | ICD-10-CM

## 2015-06-13 NOTE — Patient Instructions (Signed)
Per Dr. Henrene Pastor, decrease your Prednisone to 70m for two weeks and then stop.    Stop the enemas  Please follow up with Dr. PHenrene Pastoron 06/30/2015 at 9:00am

## 2015-06-13 NOTE — Progress Notes (Signed)
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 He subsequently underwent colonoscopy 05/15/2015. Examination revealed ulcerative proctosigmoiditis involving the distal 25 cm. Multiple biopsies from all portions of the colon were obtained. Endoscopically normal-appearing colon was normal histologically. Inflamed regions revealed chronic active colitis consistent with IBD. Patient was placed on Lialda 4.8 g daily, Rowasa enemas 4 g at night, and prednisone 20 mg daily. He has continued on these medications and follows up at this time. Patient reports that he is markedly improved. Improvement began approximate 7-10 days into therapy. He has had no blood significant urgency or abdominal discomfort and greater than 2 weeks. Currently having approximately one soft bowel movement per day. Aside from the soft nature of his bowel movement, he is pretty close to baseline. He does have multiple appropriate questions regarding his condition and therapies.  REVIEW OF SYSTEMS:  All non-GI ROS negative except for insomnia  Past Medical History  Diagnosis Date  . Right knee meniscal tear   . OA (osteoarthritis) of knee   . Hyperlipidemia   . GERD (gastroesophageal reflux disease)   . Wears glasses   . At risk for sleep apnea     STOP-BANG= 5    SENT TO PCP 03-27-2014  . Wears contact lenses   . Anal fissure   . Colon polyps     hyperplastic  . CAD (coronary artery disease)   . 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 Facial reconstruction surgery  1973  age 28  . Knee arthroscopy with anterior cruciate ligament (acl) repair Right 1994  . Wisdom tooth extraction  age 7  . Colonoscopy  04-24-2012  . Knee arthroscopy Right 03/29/2014    Procedure: RIGHT ARTHROSCOPY KNEE WITH DEBRIDMENT  PARTIAL medial lateral MENISCECTOMY AND CHONDROPLASTY;  Surgeon: Sydnee Cabal, MD;  Location: [facility];  Service: Orthopedics;  Laterality: Right;    Social History David Newman  reports that he quit smoking about 8 years ago. His smoking use included Cigarettes. He has a 35 pack-year smoking history. He has never used smokeless tobacco. He reports that he drinks alcohol. He reports that he does not use illicit drugs.  family history includes Alzheimer's disease in his mother; Colon cancer (age of onset: 62) in his cousin; Colon cancer (age of onset: 73) in his paternal uncle; Colon polyps (age of onset: 7) in his brother; Heart disease in his brother and father; Skin cancer in his father. There is no history of Stomach cancer.  Allergies  Allergen Reactions  . Sulfa Antibiotics Nausea And Vomiting       PHYSICAL EXAMINATION: Vital signs: BP 128/68 mmHg  Pulse 70  Ht 5' 11.25" (1.81 m)  Wt 218 lb (98.884 kg)  BMI 30.18 kg/m2 General: Well-developed, well-nourished, no acute distress HEENT: Sclerae are anicteric, conjunctiva pink. Oral mucosa intact Lungs: Clear Heart: Regular Abdomen: soft, nontender, nondistended, no obvious ascites, no peritoneal signs, normal bowel sounds. No organomegaly. Extremities: No cyanosis or edema Psychiatric: alert and oriented x3. Cooperative   ASSESSMENT:  #1. 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 Responding nicely to medical therapy   PLAN:  #1. Continue Lialda 4.8 g daily #2. Discontinue Rowasa enemas at this point #3. Decrease prednisone to 10 mg daily for 2 weeks, then stop. #4.  Routine office follow-up in 2 months. Contact the office in the interim for questions or problems   25 minutes was spent face-to-face with the patient. Greater than 50% of the time use for counseling regarding his colitis and answering questions regarding medical therapy, surveillance, outcomes, and cancer  risk

## 2015-06-30 ENCOUNTER — Ambulatory Visit: Payer: 59 | Admitting: Internal Medicine

## 2015-07-04 ENCOUNTER — Ambulatory Visit: Payer: 59 | Admitting: Internal Medicine

## 2015-07-23 ENCOUNTER — Encounter: Payer: Self-pay | Admitting: Internal Medicine

## 2015-07-23 ENCOUNTER — Ambulatory Visit (INDEPENDENT_AMBULATORY_CARE_PROVIDER_SITE_OTHER): Payer: 59 | Admitting: Internal Medicine

## 2015-07-23 VITALS — BP 118/68 | HR 72 | Ht 71.25 in | Wt 224.0 lb

## 2015-07-23 DIAGNOSIS — K5130 Ulcerative (chronic) rectosigmoiditis without complications: Secondary | ICD-10-CM | POA: Diagnosis not present

## 2015-07-23 NOTE — Progress Notes (Signed)
HISTORY OF PRESENT ILLNESS:  David Newman is a 55 y.o. male , David Newman, who was initially evaluated 05/13/2015 with increased frequency of loose stools, urgency, and the passage of blood with mucus. 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 He was treated with prednisone, oral mesalamine, and topical mesalamine. He was seen in follow-up 06/13/2015 at which time he was markedly improved. Mesalamine enemas were discontinued, prednisone tapered off over 2 weeks from that visit, and Lialda 4.8 grams daily continued. He presents for routine follow-up at this time. Patient reports to me that he is doing well. He continues to have 1 soft bowel movement per day without blood, mucus, or pain. He continues on his medication without issues. He did tell me that he is separating from his wife. He also noticed recurrent joint pains off prednisone. Has some issues with back pain for which she inquires about NSAIDs. No other issues  REVIEW OF SYSTEMS:  All non-GI ROS negative except for arthritis, back pain  Past Medical History  Diagnosis Date  . Right knee meniscal tear   . OA (osteoarthritis) of knee   . Hyperlipidemia   . GERD (gastroesophageal reflux disease)   . Wears glasses   . At risk for sleep apnea     STOP-BANG= 5    SENT TO PCP 03-27-2014  . Wears contact lenses   . Anal fissure   . Colon polyps     hyperplastic  . CAD (coronary artery disease)   . 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 Facial reconstruction surgery  1973  age 62  . Knee arthroscopy with anterior cruciate ligament (acl) repair Right 1994  . Wisdom tooth extraction  age 58  . Colonoscopy  04-24-2012  . Knee arthroscopy Right 03/29/2014    Procedure: RIGHT ARTHROSCOPY KNEE WITH DEBRIDMENT PARTIAL medial lateral MENISCECTOMY AND CHONDROPLASTY;  Surgeon: Sydnee Cabal, MD;   Location: [facility];  Service: Orthopedics;  Laterality: Right;    Social History David Newman  reports that he quit smoking about 8 years ago. His smoking use included Cigarettes. He has a 35 pack-year smoking history. He has never used smokeless tobacco. He reports that he drinks alcohol. He reports that he does not use illicit drugs.  family history includes Alzheimer's disease in his mother; Colon cancer (age of onset: 49) in his cousin; Colon cancer (age of onset: 39) in his paternal uncle; Colon polyps (age of onset: 64) in his brother; Heart disease in his brother and father; Skin cancer in his father. There is no history of Stomach cancer.  Allergies  Allergen Reactions  . Sulfa Antibiotics Nausea And Vomiting       PHYSICAL EXAMINATION: Vital signs: BP 118/68 mmHg  Pulse 72  Ht 5' 11.25" (1.81 m)  Wt 224 lb (101.606 kg)  BMI 31.01 kg/m2 General: Well-developed, well-nourished, no acute distress HEENT: Sclerae are anicteric, conjunctiva pink. Oral mucosa intact Lungs: Clear Heart: Regular Abdomen: soft, nontender, nondistended, no obvious ascites, no peritoneal signs, normal bowel sounds. No organomegaly. Extremities: No clubbing cyanosis or edema Psychiatric: alert and oriented x3. Cooperative   ASSESSMENT:  #1. 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 Currently asymptomatic on medical therapy   PLAN:  #1. Continue Lialda 4.8 g daily #2. Advised to avoid NSAIDs if possible as this can exacerbate IBD. He understands. #3. Routine office follow-up 6 months. Sooner  if needed  25 minutes was spent face-to-face with the patient. Greater than 50% of the time was used for counseling regarding his inflammatory bowel disease answering multiple questions to his satisfaction

## 2015-07-23 NOTE — Patient Instructions (Signed)
Please follow up in 6 months.

## 2015-11-03 DIAGNOSIS — E7800 Pure hypercholesterolemia, unspecified: Secondary | ICD-10-CM | POA: Insufficient documentation

## 2015-11-03 DIAGNOSIS — J309 Allergic rhinitis, unspecified: Secondary | ICD-10-CM | POA: Insufficient documentation

## 2015-11-04 DIAGNOSIS — F5102 Adjustment insomnia: Secondary | ICD-10-CM | POA: Diagnosis not present

## 2015-11-04 DIAGNOSIS — K5190 Ulcerative colitis, unspecified, without complications: Secondary | ICD-10-CM | POA: Insufficient documentation

## 2016-01-26 ENCOUNTER — Telehealth: Payer: Self-pay | Admitting: Internal Medicine

## 2016-01-26 MED ORDER — PREDNISONE 10 MG PO TABS: ORAL_TABLET | ORAL | 0 refills | Status: DC

## 2016-01-26 NOTE — Telephone Encounter (Signed)
Spoke with pt and he is aware, script sent to pharmacy. Will schedule pt an appt with PA when schedule available.

## 2016-01-26 NOTE — Telephone Encounter (Signed)
Pt states he is having a UC flare. Reports he started having issues about 6 days ago with urgency and loose stools. 2-3 days ago he started having bright red blood in his stool. He reports he is having 2-3 stools/day. He is currently taking Lialda 4.8g daily. He states he does have some rowasa enemas still but didn't know if he should start those or if he may need prednisone. Please advise.

## 2016-01-26 NOTE — Telephone Encounter (Signed)
Continue Lialda, Rowasa enemas at night, initiate prednisone 40 mg daily for 10 days, then 30 mg daily for 10 days, then 20 mg daily for 10 days, then 10 mg daily. Office follow-up in 4 weeks

## 2016-04-01 ENCOUNTER — Ambulatory Visit (INDEPENDENT_AMBULATORY_CARE_PROVIDER_SITE_OTHER): Payer: 59 | Admitting: Internal Medicine

## 2016-04-01 ENCOUNTER — Encounter: Payer: Self-pay | Admitting: Internal Medicine

## 2016-04-01 VITALS — BP 150/86 | HR 72 | Ht 71.25 in | Wt 239.4 lb

## 2016-04-01 DIAGNOSIS — K51311 Ulcerative (chronic) rectosigmoiditis with rectal bleeding: Secondary | ICD-10-CM

## 2016-04-01 DIAGNOSIS — K5190 Ulcerative colitis, unspecified, without complications: Secondary | ICD-10-CM | POA: Diagnosis not present

## 2016-04-01 MED ORDER — MESALAMINE 1.2 G PO TBEC: 4.8000 g | DELAYED_RELEASE_TABLET | Freq: Every day | ORAL | 11 refills | Status: DC

## 2016-04-01 MED ORDER — MESALAMINE 4 G RE ENEM: 4.0000 g | ENEMA | Freq: Every day | RECTAL | 11 refills | Status: DC

## 2016-04-01 NOTE — Patient Instructions (Signed)
We have sent the following medications to your pharmacy for you to pick up at your convenience: [place]  Please follow up with Dr Henrene Pastor in 6 months.  If you are age 54 or older, your body mass index should be between 23-30. Your Body mass index is 33.15 kg/m. If this is out of the aforementioned range listed, please consider follow up with your Primary Care Provider.  If you are age 90 or younger, your body mass index should be between 19-25. Your Body mass index is 33.15 kg/m. If this is out of the aformentioned range listed, please consider follow up with your Primary Care Provider.

## 2016-04-01 NOTE — Progress Notes (Signed)
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 Complete colonoscopy 05/15/2015 revealed ulcerative proctosigmoiditis to 25 cm. He was treated with prednisone, oral mesalamine, and topical mesalamine. Marked improvement on follow-up. Last evaluated 07/23/2015 at which time he was asymptomatic on Lialda 4.8 g daily. He was doing well until he contacted the office 01/26/2016 with recurrent colitis symptoms. These have been going on for about 3 weeks. He had been compliant with oral Lialda. He was told to resume Rowasa enemas which she did 1 week later. We also started him on prednisone taper over 40 days. Starting at 40 mg. He has been off for about 6 weeks. He reports that he did not respond as well to medical therapy as he had previously. However over the past week he does report significant improvement in symptoms. Now with one bowel movement daily. No bleeding or urgency. Somewhat less formed. He continues on dual mesalamine therapy. No other abdominal complaints. Multiple questions. Issues with stress. He did have significant weight gain on prednisone (15 pounds since his last visit)  REVIEW OF SYSTEMS:  All non-GI ROS negative except for anxiety, depression, insomnia  Past Medical History:  Diagnosis Date  . Anal fissure   . At risk for sleep apnea    STOP-BANG= 5    SENT TO PCP 03-27-2014  . CAD (coronary artery disease)   . Colon polyps    hyperplastic  . GERD (gastroesophageal reflux disease)   . Hyperlipidemia   . OA (osteoarthritis) of knee   . Right knee meniscal tear   . 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 Wears contact lenses   . Wears glasses     Past Surgical History:  Procedure Laterality Date  . COLONOSCOPY  04-24-2012  . FACIAL RECONSTRUCTION SURGERY  1973  age 39  . KNEE ARTHROSCOPY Right 03/29/2014   Procedure: RIGHT  ARTHROSCOPY KNEE WITH DEBRIDMENT PARTIAL medial lateral MENISCECTOMY AND CHONDROPLASTY;  Surgeon: Sydnee Cabal, MD;  Location: [facility];  Service: Orthopedics;  Laterality: Right;  . KNEE ARTHROSCOPY WITH ANTERIOR CRUCIATE LIGAMENT (ACL) REPAIR Right 1994  . WISDOM TOOTH EXTRACTION  age 78    Social History David Newman  reports that he quit smoking about 9 years ago. His smoking use included Cigarettes. He has a 35.00 pack-year smoking history. He has never used smokeless tobacco. He reports that he drinks alcohol. He reports that he does not use drugs.  family history includes Alzheimer's disease in his mother; Colon cancer (age of onset: 53) in his cousin; Colon cancer (age of onset: 10) in his paternal uncle; Colon polyps (age of onset: 40) in his brother; Heart disease in his brother and father; Skin cancer in his father.  Allergies  Allergen Reactions  . Sulfa Antibiotics Nausea And Vomiting       PHYSICAL EXAMINATION: Vital signs: BP (!) 150/86   Pulse 72   Ht 5' 11.25" (1.81 m)   Wt 239 lb 6 oz (108.6 kg)   BMI 33.15 kg/m   Constitutional: Obese. Ruddy complexion, generally well-appearing, no acute distress Psychiatric: alert and oriented x3, cooperative Eyes: extraocular movements intact, anicteric, conjunctiva pink Mouth: oral pharynx moist, no lesions Neck: supple no lymphadenopathy Cardiovascular: heart regular rate and rhythm, no murmur Lungs: clear to auscultation bilaterally Abdomen: soft, nontender, obese, nondistended, no obvious ascites, no peritoneal signs, normal bowel sounds,  no organomegaly Rectal: Omitted. Extremities: no lower extremity edema bilaterally Skin: no lesions on visible extremities Neuro: No focal deficits.    ASSESSMENT:  #1. 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. Recent flare. Doing better, particularly this week   PLAN:  #1. Continue Lialda 4.8 grams daily. Refilled #2. Continue Rowasa enemas. Recommended to use for  2 additional weeks after returning to baseline, then stop. May use on demand thereafter #3. Routine office follow-up 6 months. Contact the office in the interim for questions or problems #4. Discussed spectrum of ulcerative colitis including progressive disease and refractory disease. Introduce the concept of immunomodulating therapy and biologic therapy as options. Also discussed instances where colectomy might be appropriate.

## 2016-06-17 DIAGNOSIS — H5211 Myopia, right eye: Secondary | ICD-10-CM | POA: Diagnosis not present

## 2016-06-17 DIAGNOSIS — H52201 Unspecified astigmatism, right eye: Secondary | ICD-10-CM | POA: Diagnosis not present

## 2016-06-17 DIAGNOSIS — H5212 Myopia, left eye: Secondary | ICD-10-CM | POA: Diagnosis not present

## 2016-06-17 DIAGNOSIS — H524 Presbyopia: Secondary | ICD-10-CM | POA: Diagnosis not present

## 2016-09-01 DIAGNOSIS — R739 Hyperglycemia, unspecified: Secondary | ICD-10-CM | POA: Diagnosis not present

## 2016-09-01 DIAGNOSIS — F5102 Adjustment insomnia: Secondary | ICD-10-CM | POA: Diagnosis not present

## 2016-09-01 DIAGNOSIS — Z125 Encounter for screening for malignant neoplasm of prostate: Secondary | ICD-10-CM | POA: Diagnosis not present

## 2016-09-01 DIAGNOSIS — R635 Abnormal weight gain: Secondary | ICD-10-CM | POA: Diagnosis not present

## 2016-09-01 DIAGNOSIS — E7800 Pure hypercholesterolemia, unspecified: Secondary | ICD-10-CM | POA: Diagnosis not present

## 2016-09-01 DIAGNOSIS — H6123 Impacted cerumen, bilateral: Secondary | ICD-10-CM | POA: Diagnosis not present

## 2016-09-01 DIAGNOSIS — Z23 Encounter for immunization: Secondary | ICD-10-CM | POA: Diagnosis not present

## 2016-12-31 DIAGNOSIS — R7301 Impaired fasting glucose: Secondary | ICD-10-CM | POA: Diagnosis not present

## 2016-12-31 DIAGNOSIS — E785 Hyperlipidemia, unspecified: Secondary | ICD-10-CM | POA: Diagnosis not present

## 2017-01-26 DIAGNOSIS — L821 Other seborrheic keratosis: Secondary | ICD-10-CM | POA: Diagnosis not present

## 2017-01-26 DIAGNOSIS — B353 Tinea pedis: Secondary | ICD-10-CM | POA: Diagnosis not present

## 2017-01-26 DIAGNOSIS — D225 Melanocytic nevi of trunk: Secondary | ICD-10-CM | POA: Diagnosis not present

## 2017-01-26 DIAGNOSIS — B351 Tinea unguium: Secondary | ICD-10-CM | POA: Diagnosis not present

## 2017-01-26 DIAGNOSIS — D2262 Melanocytic nevi of left upper limb, including shoulder: Secondary | ICD-10-CM | POA: Diagnosis not present

## 2017-01-26 DIAGNOSIS — D1801 Hemangioma of skin and subcutaneous tissue: Secondary | ICD-10-CM | POA: Diagnosis not present

## 2017-01-26 DIAGNOSIS — L218 Other seborrheic dermatitis: Secondary | ICD-10-CM | POA: Diagnosis not present

## 2017-01-26 DIAGNOSIS — D2261 Melanocytic nevi of right upper limb, including shoulder: Secondary | ICD-10-CM | POA: Diagnosis not present

## 2017-04-05 ENCOUNTER — Encounter: Payer: Self-pay | Admitting: Pulmonary Disease

## 2017-04-05 ENCOUNTER — Ambulatory Visit (INDEPENDENT_AMBULATORY_CARE_PROVIDER_SITE_OTHER): Payer: 59 | Admitting: Pulmonary Disease

## 2017-04-05 VITALS — BP 124/84 | HR 89 | Ht 71.25 in | Wt 240.0 lb

## 2017-04-05 DIAGNOSIS — G4733 Obstructive sleep apnea (adult) (pediatric): Secondary | ICD-10-CM | POA: Diagnosis not present

## 2017-04-05 NOTE — Patient Instructions (Signed)
Home sleep study will be scheduled. Based on this, we will  Consider starting a CPAP machine

## 2017-04-05 NOTE — Progress Notes (Signed)
Subjective:    Patient ID: David Newman, male    DOB: 10/08/1960, 56 y.o.   MRN: [id number]  HPI  Chief Complaint  Patient presents with  . Sleep Consult    Referred by Bing Matter at [facility] for possible OSA. Patient reports that he feels like he stops breathing during the night. Has not had a sleep study before.     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 He is divorced and his youngest daughter lives earlier this year.  She has reported loud snoring which could be heard across the hall and occasional gasping episodes and witnessed apneas He also reports non-refreshing sleep. For the last year, during a turbulent divorce he started taking Ativan 0.5 mg at bedtime for sleep together with 3 mg of melatonin. Bedtime is between 10 and 10:30 PM, sleep latency is about 20 minutes after taking these medications, he sleeps on his side with one pillow, reports 3-4 nocturnal awakenings but denies nocturia and is out of bed by 5:30 AM feeling tired with dryness of mouth and occasional occipital headache.  His weight is now down 10 pounds and he is started on an exercise regimen in the morning. He reports grinding his teeth and has used a mouthguard in the past without much success.  He quit smoking in 2008, smoked about 35 pack years. His ulcerative colitis is controlled on medication  There is no history suggestive of cataplexy, sleep paralysis or parasomnias     Past Medical History:  Diagnosis Date  . Anal fissure   . At risk for sleep apnea    STOP-BANG= 5    SENT TO PCP 03-27-2014  . CAD (coronary artery disease)   . Colon polyps    hyperplastic  . GERD (gastroesophageal reflux disease)   . Hyperlipidemia   . OA (osteoarthritis) of knee   . Right knee meniscal tear   . 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 Wears contact lenses   . Wears glasses    Past Surgical History:  Procedure  Laterality Date  . COLONOSCOPY  04-24-2012  . FACIAL RECONSTRUCTION SURGERY  1973  age 37  . KNEE ARTHROSCOPY WITH ANTERIOR CRUCIATE LIGAMENT (ACL) REPAIR Right 1994  . RIGHT ARTHROSCOPY KNEE WITH DEBRIDMENT PARTIAL medial lateral MENISCECTOMY AND CHONDROPLASTY Right 03/29/2014   Performed by Sydnee Cabal, MD at [facility]  . WISDOM TOOTH EXTRACTION  age 60    Allergies  Allergen Reactions  . Sulfa Antibiotics Nausea And Vomiting     Social History   Socioeconomic History  . Marital status: Married    Spouse name: Not on file  . Number of children: 2  . Years of education: Not on file  . Highest education level: Not on file  Social Needs  . Financial resource strain: Not on file  . Food insecurity - worry: Not on file  . Food insecurity - inability: Not on file  . Transportation needs - medical: Not on file  . Transportation needs - non-medical: Not on file  Occupational History  . Occupation: IT    Comment: [employer]  Tobacco Use  . Smoking status: Former Smoker    Packs/day: 1.00    Years: 35.00    Pack years: 35.00    Types: Cigarettes    Last attempt to quit: 04/04/2007    Years since quitting: 10.0  . Smokeless tobacco: Never Used  Substance and Sexual Activity  .  Alcohol use: Yes    Alcohol/week: 0.0 oz    Comment: OCCASIONAL  . Drug use: No  . Sexual activity: Not on file  Other Topics Concern  . Not on file  Social History Narrative  . Not on file     Family History  Problem Relation Age of Onset  . Colon cancer Paternal Uncle 79  . Colon cancer Cousin 78  . Colon polyps Brother 58  . Heart disease Father   . Skin cancer Father   . Heart disease Brother   . Alzheimer's disease Mother   . Stomach cancer Neg Hx      Review of Systems   Constitutional: negative for anorexia, fevers and sweats  Eyes: negative for irritation, redness and visual disturbance  Ears, nose, mouth, throat, and face: negative for earaches,  epistaxis, nasal congestion and sore throat  Respiratory: negative for cough, dyspnea on exertion, sputum and wheezing  Cardiovascular: negative for chest pain, dyspnea, lower extremity edema, orthopnea, palpitations and syncope  Gastrointestinal: negative for abdominal pain, constipation, diarrhea, melena, nausea and vomiting  Genitourinary:negative for dysuria, frequency and hematuria  Hematologic/lymphatic: negative for bleeding, easy bruising and lymphadenopathy  Musculoskeletal:negative for arthralgias, muscle weakness and stiff joints  Neurological: negative for coordination problems, gait problems, headaches and weakness  Endocrine: negative for diabetic symptoms including polydipsia, polyuria and weight loss     Objective:   Physical Exam   Gen. Pleasant, obese, in no distress, normal affect ENT - no lesions, no post nasal drip, class 2-3 airway Neck: No JVD, no thyromegaly, no carotid bruits Lungs: no use of accessory muscles, no dullness to percussion, decreased without rales or rhonchi  Cardiovascular: Rhythm regular, heart sounds  normal, no murmurs or gallops, no peripheral edema Abdomen: soft and non-tender, no hepatosplenomegaly, BS normal. Musculoskeletal: No deformities, no cyanosis or clubbing Neuro:  alert, non focal, no tremors        Assessment & Plan:

## 2017-04-05 NOTE — Assessment & Plan Note (Signed)
Given excessive daytime somnolence, narrow pharyngeal exam, witnessed apneas & loud snoring, obstructive sleep apnea is very likely & an overnight polysomnogram will be scheduled as a home study. The pathophysiology of obstructive sleep apnea , it's cardiovascular consequences & modes of treatment including CPAP were discused with the patient in detail & they evidenced understanding.  Pretest probability is high and he will likely need CPAP machine since he did not do well with a mouthguard in the past

## 2017-05-03 DIAGNOSIS — G4733 Obstructive sleep apnea (adult) (pediatric): Secondary | ICD-10-CM | POA: Diagnosis not present

## 2017-05-06 DIAGNOSIS — G4733 Obstructive sleep apnea (adult) (pediatric): Secondary | ICD-10-CM | POA: Diagnosis not present

## 2017-05-09 ENCOUNTER — Telehealth: Payer: Self-pay | Admitting: Pulmonary Disease

## 2017-05-09 NOTE — Telephone Encounter (Signed)
Per RA, HST showed moderate OSA with 15 events per hour. Recommends an auto CPAP 5-15cm, DL in 4-6 weeks and f/u with TP.

## 2017-05-11 ENCOUNTER — Other Ambulatory Visit: Payer: Self-pay | Admitting: *Deleted

## 2017-05-11 DIAGNOSIS — G4733 Obstructive sleep apnea (adult) (pediatric): Secondary | ICD-10-CM

## 2017-05-18 ENCOUNTER — Other Ambulatory Visit: Payer: Self-pay | Admitting: Internal Medicine

## 2017-05-18 DIAGNOSIS — K51311 Ulcerative (chronic) rectosigmoiditis with rectal bleeding: Secondary | ICD-10-CM

## 2017-05-19 ENCOUNTER — Telehealth: Payer: Self-pay | Admitting: Internal Medicine

## 2017-05-19 DIAGNOSIS — K51311 Ulcerative (chronic) rectosigmoiditis with rectal bleeding: Secondary | ICD-10-CM

## 2017-05-19 MED ORDER — MESALAMINE 1.2 G PO TBEC: 4.8000 g | DELAYED_RELEASE_TABLET | Freq: Every day | ORAL | 3 refills | Status: DC

## 2017-05-19 NOTE — Telephone Encounter (Signed)
Lialda refilled

## 2017-06-15 ENCOUNTER — Telehealth: Payer: Self-pay | Admitting: Pulmonary Disease

## 2017-06-15 DIAGNOSIS — G4733 Obstructive sleep apnea (adult) (pediatric): Secondary | ICD-10-CM

## 2017-06-15 NOTE — Telephone Encounter (Signed)
Called and spoke with pt letting him know the results of the HST and recommendations were to start him on cpap.  Pt expressed understanding. Order placed for pt to begin cpap and f/u ov scheduled for pt. Nothing further needed at this current time.

## 2017-06-15 NOTE — Telephone Encounter (Signed)
Pt made aware of hst results. Order placed for pt to begin cpap and f/u ov scheduled for pt with TP. Nothing further needed at this current time.

## 2017-06-22 ENCOUNTER — Telehealth: Payer: Self-pay | Admitting: Pulmonary Disease

## 2017-06-22 NOTE — Telephone Encounter (Signed)
Orders were sent to [name] 06/15/17.David Newman

## 2017-06-22 NOTE — Telephone Encounter (Signed)
Called [facility] and go no answer will call again later Joellen [place]

## 2017-06-23 ENCOUNTER — Ambulatory Visit: Payer: 59 | Admitting: Internal Medicine

## 2017-06-23 ENCOUNTER — Encounter: Payer: Self-pay | Admitting: Internal Medicine

## 2017-06-23 ENCOUNTER — Other Ambulatory Visit (INDEPENDENT_AMBULATORY_CARE_PROVIDER_SITE_OTHER): Payer: 59

## 2017-06-23 DIAGNOSIS — H5212 Myopia, left eye: Secondary | ICD-10-CM | POA: Diagnosis not present

## 2017-06-23 DIAGNOSIS — K51311 Ulcerative (chronic) rectosigmoiditis with rectal bleeding: Secondary | ICD-10-CM | POA: Diagnosis not present

## 2017-06-23 DIAGNOSIS — H524 Presbyopia: Secondary | ICD-10-CM | POA: Diagnosis not present

## 2017-06-23 DIAGNOSIS — H5211 Myopia, right eye: Secondary | ICD-10-CM | POA: Diagnosis not present

## 2017-06-23 DIAGNOSIS — H52201 Unspecified astigmatism, right eye: Secondary | ICD-10-CM | POA: Diagnosis not present

## 2017-06-23 LAB — COMPREHENSIVE METABOLIC PANEL
ALBUMIN: 4.5 g/dL (ref 3.5–5.2)
ALT: 24 U/L (ref 0–53)
AST: 22 U/L (ref 0–37)
Alkaline Phosphatase: 70 U/L (ref 39–117)
BUN: 17 mg/dL (ref 6–23)
CHLORIDE: 103 meq/L (ref 96–112)
CO2: 28 mEq/L (ref 19–32)
CREATININE: 1.24 mg/dL (ref 0.40–1.50)
Calcium: 9.4 mg/dL (ref 8.4–10.5)
GFR: 64.2 mL/min (ref >60.00)
Glucose, Bld: 90 mg/dL (ref 70–99)
POTASSIUM: 3.9 meq/L (ref 3.5–5.1)
SODIUM: 139 meq/L (ref 135–145)
Total Bilirubin: 0.7 mg/dL (ref 0.2–1.2)
Total Protein: 7.2 g/dL (ref 6.0–8.3)

## 2017-06-23 LAB — CBC WITH DIFFERENTIAL/PLATELET
Basophils Absolute: 0.1 10*3/uL (ref 0.0–0.1)
Basophils Relative: 1.5 % (ref 0.0–3.0)
EOS ABS: 0.2 10*3/uL (ref 0.0–0.7)
EOS PCT: 2.7 % (ref 0.0–5.0)
HEMATOCRIT: 42.7 % (ref 39.0–52.0)
HEMOGLOBIN: 14.7 g/dL (ref 13.0–17.0)
LYMPHS PCT: 23 % (ref 12.0–46.0)
Lymphs Abs: 2.1 10*3/uL (ref 0.7–4.0)
MCHC: 34.5 g/dL (ref 30.0–36.0)
MCV: 88.3 fl (ref 78.0–100.0)
MONO ABS: 1.1 10*3/uL — AB (ref 0.1–1.0)
Monocytes Relative: 11.5 % (ref 3.0–12.0)
NEUTROS ABS: 5.6 10*3/uL (ref 1.4–7.7)
Neutrophils Relative %: 61.3 % (ref 43.0–77.0)
PLATELETS: 270 10*3/uL (ref 150.0–400.0)
RBC: 4.84 Mil/uL (ref 4.22–5.81)
RDW: 13 % (ref 11.5–15.5)
WBC: 9.2 10*3/uL (ref 4.0–10.5)

## 2017-06-23 LAB — HIGH SENSITIVITY CRP: CRP HIGH SENSITIVITY: 0.97 mg/L (ref 0.000–5.000)

## 2017-06-23 MED ORDER — MESALAMINE 1.2 G PO TBEC: 4.8000 g | DELAYED_RELEASE_TABLET | Freq: Every day | ORAL | 3 refills | Status: DC

## 2017-06-23 NOTE — Patient Instructions (Signed)
If you are age 56 or older, your body mass index should be between 23-30. Your Body mass index is 33.03 kg/m. If this is out of the aforementioned range listed, please consider follow up with your Primary Care Provider.  If you are age 38 or younger, your body mass index should be between 19-25. Your Body mass index is 33.03 kg/m. If this is out of the aformentioned range listed, please consider follow up with your Primary Care Provider.   We have sent the following medications to your pharmacy for you to pick up at your convenience: [facility] has requested that you go to the basement for the following lab work before leaving today: CBC, CMET, CRP  Please follow up with Dr. Henrene Pastor in 1 year.

## 2017-06-23 NOTE — Progress Notes (Signed)
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 Complete colonoscopy that same month revealed ulcerative rectosigmoiditis to 25 cm. He has been treated with mesalamine agents and steroids sparingly. He was last evaluated 04/01/2016 at which time he was doing well after recent flare. He has continued only old a 4.8 g daily. He has not needed Rowasa enemas since last August. He presents today for routine follow-up. He is pleased to report that he is doing well. One bowel movement per day. Typically formed. No blood. No abdominal pain. His weight has been stable. No new complaints. He does request medication refill. He has had no blood work in over a year.  REVIEW OF SYSTEMS:  All non-GI ROS negative except for sleep apnea  Past Medical History:  Diagnosis Date  . Anal fissure   . At risk for sleep apnea    STOP-BANG= 5    SENT TO PCP 03-27-2014  . CAD (coronary artery disease)   . Colon polyps    hyperplastic  . GERD (gastroesophageal reflux disease)   . Hyperlipidemia   . OA (osteoarthritis) of knee   . Right knee meniscal tear   . 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 Wears contact lenses   . Wears glasses     Past Surgical History:  Procedure Laterality Date  . COLONOSCOPY  04-24-2012  . FACIAL RECONSTRUCTION SURGERY  1973  age 40  . KNEE ARTHROSCOPY Right 03/29/2014   Procedure: RIGHT ARTHROSCOPY KNEE WITH DEBRIDMENT PARTIAL medial lateral MENISCECTOMY AND CHONDROPLASTY;  Surgeon: Sydnee Cabal, MD;  Location: [facility];  Service: Orthopedics;  Laterality: Right;  . KNEE ARTHROSCOPY WITH ANTERIOR CRUCIATE LIGAMENT (ACL) REPAIR Right 1994  . WISDOM TOOTH EXTRACTION  age 45    Social History MAC DOWDELL  reports that he quit smoking about 10 years ago. His smoking use included cigarettes. He has a 35.00 pack-year  smoking history. he has never used smokeless tobacco. He reports that he drinks alcohol. He reports that he does not use drugs.  family history includes Alzheimer's disease in his mother; Colon cancer (age of onset: 28) in his cousin; Colon cancer (age of onset: 2) in his paternal uncle; Colon polyps (age of onset: 50) in his brother; Heart disease in his brother and father; Skin cancer in his father.  Allergies  Allergen Reactions  . Sulfa Antibiotics Nausea And Vomiting       PHYSICAL EXAMINATION: Vital signs: BP 134/88 (BP Location: Left Arm, Patient Position: Sitting, Cuff Size: Normal)   Pulse 92   Ht 5' 11.25" (1.81 m)   Wt 238 lb 8 oz (108.2 kg)   BMI 33.03 kg/m   Constitutional: generally well-appearing, no acute distress Psychiatric: alert and oriented x3, cooperative Eyes: extraocular movements intact, anicteric, conjunctiva pink Mouth: oral pharynx moist, no lesions Neck: supple no lymphadenopathy Cardiovascular: heart regular rate and rhythm, no murmur Lungs: clear to auscultation bilaterally Abdomen: soft, obese, nontender, nondistended, no obvious ascites, no peritoneal signs, normal bowel sounds, no organomegaly Rectal: Omitted Extremities: no clubbing, cyanosis, or lower extremity edema bilaterally Skin: no lesions on visible extremities Neuro: No focal deficits. No asterixis.   ASSESSMENT:  #1. 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. In clinical remission on mesalamine   PLAN:  #1. Continue Lialda 4.8 g daily. Refilled #2. CBC, comprehensive metabolic panel, CRP today #3. Routine GI follow-up one year.  Sooner if needed

## 2017-06-24 NOTE — Telephone Encounter (Signed)
Spoke to [email or web address]  she has called pt and made him an appt to come in to get set up David Newman

## 2017-06-27 DIAGNOSIS — G4733 Obstructive sleep apnea (adult) (pediatric): Secondary | ICD-10-CM | POA: Diagnosis not present

## 2017-06-27 DIAGNOSIS — G4710 Hypersomnia, unspecified: Secondary | ICD-10-CM | POA: Diagnosis not present

## 2017-07-25 DIAGNOSIS — G4733 Obstructive sleep apnea (adult) (pediatric): Secondary | ICD-10-CM | POA: Diagnosis not present

## 2017-07-25 DIAGNOSIS — G4710 Hypersomnia, unspecified: Secondary | ICD-10-CM | POA: Diagnosis not present

## 2017-07-27 ENCOUNTER — Ambulatory Visit: Payer: 59 | Admitting: Adult Health

## 2017-07-27 DIAGNOSIS — G4730 Sleep apnea, unspecified: Secondary | ICD-10-CM | POA: Diagnosis not present

## 2017-07-27 DIAGNOSIS — E7800 Pure hypercholesterolemia, unspecified: Secondary | ICD-10-CM | POA: Diagnosis not present

## 2017-07-27 DIAGNOSIS — F5102 Adjustment insomnia: Secondary | ICD-10-CM | POA: Diagnosis not present

## 2017-08-05 ENCOUNTER — Ambulatory Visit: Payer: 59 | Admitting: Adult Health

## 2017-08-05 ENCOUNTER — Encounter: Payer: Self-pay | Admitting: Adult Health

## 2017-08-05 DIAGNOSIS — G4733 Obstructive sleep apnea (adult) (pediatric): Secondary | ICD-10-CM

## 2017-08-05 DIAGNOSIS — E669 Obesity, unspecified: Secondary | ICD-10-CM | POA: Insufficient documentation

## 2017-08-05 NOTE — Assessment & Plan Note (Addendum)
Excellent control and compliance on CPAP  Plan  Patient Instructions  Keep up the good work Continue on CPAP at bedtime Work on healthy weight Do not drive a sleepy Follow-up with Dr. Elsworth Soho in 6 months and As needed

## 2017-08-05 NOTE — Assessment & Plan Note (Signed)
Wt loss  

## 2017-08-05 NOTE — Patient Instructions (Addendum)
Keep up the good work Continue on CPAP at bedtime Work on healthy weight Do not drive a sleepy Follow-up with Dr. Elsworth Soho in 6 months and As needed

## 2017-08-05 NOTE — Progress Notes (Signed)
@Patient  ID: David Newman, male    DOB: 1960/10/06, 57 y.o.   MRN: [id number]  Chief Complaint  Patient presents with  . 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, followed for obstructive sleep apnea  TEST  HST showed moderate OSA with 15 events per hour   08/05/2017 Follow up : OSA  Patient returns for a 86-monthfollow-up for sleep apnea.  Patient was seen last visit for a sleep consult for daytime sleepiness.  He was set up for a home sleep study that showed moderate sleep apnea with AHI at 15 events an hour patient was started on CPAP at bedtime.  Patient says he is doing well on CPAP.  He feels rested.  He feels he benefits from CPAP.  He feels that his daytime sleepiness has decreased.  Patient is on CPAP AutoSet 5-15 Summers H2O.  Download shows excellent compliance with average usage at 7.5 hours.  AHI 1.5.  Minimum leaks.  Allergies  Allergen Reactions  . Sulfa Antibiotics Nausea And Vomiting    Immunization History  Administered Date(s) Administered  . Influenza Split 03/07/2017    Past Medical History:  Diagnosis Date  . Anal fissure   . At risk for sleep apnea    STOP-BANG= 5    SENT TO PCP 03-27-2014  . CAD (coronary artery disease)   . Colon polyps    hyperplastic  . GERD (gastroesophageal reflux disease)   . Hyperlipidemia   . OA (osteoarthritis) of knee   . Right knee meniscal tear   . 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[place]   . Wears contact lenses   . Wears glasses     Tobacco History: Social History   Tobacco Use  Smoking Status Former Smoker  . Packs/day: 1.00  . Years: 35.00  . Pack years: 35.00  . Types: Cigarettes  . Last attempt to quit: 04/04/2007  . Years since quitting: 10.3  Smokeless Tobacco Never Used   Counseling given: Not Answered   Outpatient Encounter Medications as of 08/05/2017  Medication Sig  . aspirin 81 MG chewable tablet Chew 81 mg by  mouth.  . calcium carbonate (TUMS - DOSED IN MG ELEMENTAL CALCIUM) 500 MG chewable tablet Chew 1 tablet by mouth as needed for indigestion or heartburn. Reported on 05/15/2015  . LORazepam (ATIVAN) 1 MG tablet 1/2  - 1 1/2 po qhs prn  . mesalamine (LIALDA) 1.2 g EC tablet Take 4 tablets (4.8 g total) by mouth daily with breakfast.  . rosuvastatin (CRESTOR) 5 MG tablet Take 5 mg by mouth as needed. Reported on 06/13/2015   No facility-administered encounter medications on file as of 08/05/2017.      Review of Systems  Constitutional:   No  weight loss, night sweats,  Fevers, chills, fatigue, or  lassitude.  HEENT:   No headaches,  Difficulty swallowing,  Tooth/dental problems, or  Sore throat,                No sneezing, itching, ear ache, nasal congestion, post nasal drip,   CV:  No chest pain,  Orthopnea, PND, swelling in lower extremities, anasarca, dizziness, palpitations, syncope.   GI  No heartburn, indigestion, abdominal pain, nausea, vomiting, diarrhea, change in bowel habits, loss of appetite, bloody stools.   Resp: No shortness of breath with exertion or at rest.  No excess mucus, no productive cough,  No non-productive cough,  No coughing up of blood.  No change in color of mucus.  No wheezing.  No chest wall deformity  Skin: no rash or lesions.  GU: no dysuria, change in color of urine, no urgency or frequency.  No flank pain, no hematuria   MS:  No joint pain or swelling.  No decreased range of motion.  No back pain.    Physical Exam  BP 132/88 (BP Location: Left Arm, Cuff Size: Normal)   Pulse 77   Ht 6' (1.829 m)   Wt 242 lb (109.8 kg)   SpO2 97%   BMI 32.82 kg/m   GEN: A/Ox3; pleasant , NAD, obese   HEENT:  [place]/AT,  EACs-clear, TMs-wnl, NOSE-clear, THROAT-clear, no lesions, no postnasal drip or exudate noted.  Class  2-3 MP airway  NECK:  Supple w/ fair ROM; no JVD; normal carotid impulses w/o bruits; no thyromegaly or nodules palpated; no lymphadenopathy.     RESP  Clear  P & A; w/o, wheezes/ rales/ or rhonchi. no accessory muscle use, no dullness to percussion  CARD:  RRR, no m/r/g, no peripheral edema, pulses intact, no cyanosis or clubbing.  GI:   Soft & nt; nml bowel sounds; no organomegaly or masses detected.   Musco: Warm bil, no deformities or joint swelling noted.   Neuro: alert, no focal deficits noted.    Skin: Warm, no lesions or rashes    Lab Results:  CBC    Component Value Date/Time   WBC 9.2 06/23/2017 1624   RBC 4.84 06/23/2017 1624   HGB 14.7 06/23/2017 1624   HCT 42.7 06/23/2017 1624   PLT 270.0 06/23/2017 1624   MCV 88.3 06/23/2017 1624   MCHC 34.5 06/23/2017 1624   RDW 13.0 06/23/2017 1624   LYMPHSABS 2.1 06/23/2017 1624   MONOABS 1.1 (H) 06/23/2017 1624   EOSABS 0.2 06/23/2017 1624   BASOSABS 0.1 06/23/2017 1624    BMET    Component Value Date/Time   NA 139 06/23/2017 1624   K 3.9 06/23/2017 1624   CL 103 06/23/2017 1624   CO2 28 06/23/2017 1624   GLUCOSE 90 06/23/2017 1624   BUN 17 06/23/2017 1624   CREATININE 1.24 06/23/2017 1624   CALCIUM 9.4 06/23/2017 1624    BNP No results found for: BNP  ProBNP No results found for: PROBNP  Imaging: No results found.   Assessment & Plan:   OSA (obstructive sleep apnea) Excellent control and compliance on CPAP  Plan  Patient Instructions  Keep up the good work Continue on CPAP at bedtime Work on healthy weight Do not drive a sleepy Follow-up with Dr. Elsworth Soho in 6 months and As needed       Morbid obesity ([place]) Wt loss      Rexene Edison, NP 08/05/2017

## 2017-08-25 DIAGNOSIS — G4733 Obstructive sleep apnea (adult) (pediatric): Secondary | ICD-10-CM | POA: Diagnosis not present

## 2017-08-25 DIAGNOSIS — G4710 Hypersomnia, unspecified: Secondary | ICD-10-CM | POA: Diagnosis not present

## 2017-09-12 DIAGNOSIS — Z0000 Encounter for general adult medical examination without abnormal findings: Secondary | ICD-10-CM | POA: Diagnosis not present

## 2017-09-12 DIAGNOSIS — R739 Hyperglycemia, unspecified: Secondary | ICD-10-CM | POA: Diagnosis not present

## 2017-09-12 DIAGNOSIS — Z125 Encounter for screening for malignant neoplasm of prostate: Secondary | ICD-10-CM | POA: Diagnosis not present

## 2017-09-21 DIAGNOSIS — R739 Hyperglycemia, unspecified: Secondary | ICD-10-CM | POA: Diagnosis not present

## 2017-09-21 DIAGNOSIS — Z0000 Encounter for general adult medical examination without abnormal findings: Secondary | ICD-10-CM | POA: Diagnosis not present

## 2017-09-21 DIAGNOSIS — R030 Elevated blood-pressure reading, without diagnosis of hypertension: Secondary | ICD-10-CM | POA: Diagnosis not present

## 2017-09-24 DIAGNOSIS — G4733 Obstructive sleep apnea (adult) (pediatric): Secondary | ICD-10-CM | POA: Diagnosis not present

## 2017-09-24 DIAGNOSIS — G4710 Hypersomnia, unspecified: Secondary | ICD-10-CM | POA: Diagnosis not present

## 2017-09-30 DIAGNOSIS — G4733 Obstructive sleep apnea (adult) (pediatric): Secondary | ICD-10-CM | POA: Diagnosis not present

## 2017-10-03 ENCOUNTER — Other Ambulatory Visit: Payer: Self-pay | Admitting: Internal Medicine

## 2017-10-03 DIAGNOSIS — K51311 Ulcerative (chronic) rectosigmoiditis with rectal bleeding: Secondary | ICD-10-CM

## 2018-03-14 DIAGNOSIS — M545 Low back pain: Secondary | ICD-10-CM | POA: Diagnosis not present

## 2018-03-14 DIAGNOSIS — M25561 Pain in right knee: Secondary | ICD-10-CM | POA: Insufficient documentation

## 2018-03-14 DIAGNOSIS — M5136 Other intervertebral disc degeneration, lumbar region: Secondary | ICD-10-CM | POA: Diagnosis not present

## 2018-03-14 DIAGNOSIS — M1711 Unilateral primary osteoarthritis, right knee: Secondary | ICD-10-CM | POA: Diagnosis not present

## 2018-03-28 ENCOUNTER — Encounter: Payer: Self-pay | Admitting: Physical Therapy

## 2018-03-28 ENCOUNTER — Other Ambulatory Visit: Payer: Self-pay

## 2018-03-28 ENCOUNTER — Ambulatory Visit: Payer: 59 | Attending: Physician Assistant | Admitting: Physical Therapy

## 2018-03-28 DIAGNOSIS — M545 Low back pain, unspecified: Secondary | ICD-10-CM

## 2018-03-28 DIAGNOSIS — R293 Abnormal posture: Secondary | ICD-10-CM | POA: Insufficient documentation

## 2018-03-28 DIAGNOSIS — G8929 Other chronic pain: Secondary | ICD-10-CM | POA: Diagnosis not present

## 2018-03-28 DIAGNOSIS — M62830 Muscle spasm of back: Secondary | ICD-10-CM | POA: Diagnosis not present

## 2018-03-28 NOTE — Patient Instructions (Signed)
Access Code: [email or web address]  URL: [email or web address]  Date: 03/28/2018  Prepared by: Sherol Dade   Exercises  Sidelying Thoracic Lumbar Rotation - 15 reps - 2x daily - 7x weekly  Sidelying Thoracic and Shoulder Rotation - 15 reps - 2x daily - 7x weekly     [facility] [address], [place], [place] [address] Phone # [phone] Fax [phone]

## 2018-03-28 NOTE — Therapy (Signed)
[facility] [address], [place], [place], [address] Phone: [phone]   Fax:  [phone]  Physical Therapy Evaluation  Patient Details  Name: David Newman MRN: [id number] Date of Birth: [birth year] Referring Provider (PT): Gerrit Halls, PA-C   Encounter Date: 03/28/2018  PT End of Session - 03/28/18 1115    Visit Number  1    Date for PT Re-Evaluation  05/26/18    Authorization Type  [employer] [facility]    Authorization Time Period  03/28/17 to 05/26/18    PT Start Time  1018    PT Stop Time  1105    PT Time Calculation (min)  47 min    Activity Tolerance  No increased pain;Patient tolerated treatment well    Behavior During Therapy  [facility] for tasks assessed/performed       Past Medical History:  Diagnosis Date  . Anal fissure   . At risk for sleep apnea    STOP-BANG= 5    SENT TO PCP 03-27-2014  . CAD (coronary artery disease)   . Colon polyps    hyperplastic  . GERD (gastroesophageal reflux disease)   . Hyperlipidemia   . OA (osteoarthritis) of knee   . Right knee meniscal tear   . 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 Wears contact lenses   . Wears glasses     Past Surgical History:  Procedure Laterality Date  . COLONOSCOPY  04-24-2012  . FACIAL RECONSTRUCTION SURGERY  1973  age 57  . KNEE ARTHROSCOPY Right 03/29/2014   Procedure: RIGHT ARTHROSCOPY KNEE WITH DEBRIDMENT PARTIAL medial lateral MENISCECTOMY AND CHONDROPLASTY;  Surgeon: Sydnee Cabal, MD;  Location: [facility];  Service: Orthopedics;  Laterality: Right;  . KNEE ARTHROSCOPY WITH ANTERIOR CRUCIATE LIGAMENT (ACL) REPAIR Right 1994  . WISDOM TOOTH EXTRACTION  age 53    There were no vitals filed for this visit.   Subjective Assessment - 03/28/18 1022    Subjective  Pt reports that he has had off and on low back pain for several years. He usually does fine with it and it would come and go. Over this past year it has  been more consistent to the point where he went to get it checked out. He started golf about 1 year ago and was going approximately 2 days a week. He would also do alot of kayaking which would result in increased back pain.     Pertinent History  Rt knee scope and ACL repair    Limitations  Lifting    Diagnostic tests  degenerative changes in lumbar spine (L5/S1)    Patient Stated Goals  decrease pain     Currently in Pain?  Yes    Pain Score  1     Pain Location  Back    Pain Orientation  Lower;Left    Pain Descriptors / Indicators  Aching    Pain Type  Chronic pain    Pain Radiating Towards  none currently    Pain Onset  More than a month ago    Pain Frequency  Intermittent    Aggravating Factors   standing, walking, alot of activity    Pain Relieving Factors  rest, medication helps some    Effect of Pain on Daily Activities  limited participation in kayaking and golfing         [facility] PT Assessment - 03/28/18 0001      Assessment   Medical Diagnosis  Lumbar DDD  Referring Provider (PT)  Gerrit Halls, PA-C    Onset Date/Surgical Date  --   several years ago    Prior Therapy  none       Precautions   Precautions  None      Balance Screen   Has the patient fallen in the past 6 months  No    Has the patient had a decrease in activity level because of a fear of falling?   No    Is the patient reluctant to leave their home because of a fear of falling?   No      Home Film/video editor residence      Prior Function   Vocation  Full time employment    Vocation Requirements  alot of desk work, often lifting boxes of computers       Cognition   Overall Cognitive Status  Within Functional Limits for tasks assessed      Observation/Other Assessments   Focus on Therapeutic Outcomes (FOTO)   37% limited       Sensation   Additional Comments  denies numbness/tingling      Posture/Postural Control   Posture Comments  slouched sitting in chair  during evaluation      ROM / Strength   AROM / PROM / Strength  AROM;Strength      AROM   Overall AROM Comments  Passive Rt hip IR 20 deg, Lt hip 20 deg IR    AROM Assessment Site  Lumbar    Lumbar Flexion  WNL, pull stretch end range    Lumbar Extension  WNL, pressure end range     Lumbar - Right Rotation  limited, no pain    Lumbar - Left Rotation  limited, pain free      Strength   Strength Assessment Site  Hip;Knee    Right/Left Hip  Right;Left    Right Hip Extension  5/5    Right Hip ABduction  5/5    Left Hip Extension  5/5    Left Hip ABduction  5/5    Right/Left Knee  Right;Left    Right Knee Flexion  5/5    Right Knee Extension  5/5    Left Knee Flexion  5/5    Left Knee Extension  5/5      Flexibility   Soft Tissue Assessment /Muscle Length  yes    Hamstrings  WNL    Quadriceps  WNL    Piriformis  limited atleast 75% BLE      Palpation   Spinal mobility  hypomobile thoracic and lumbar region    Palpation comment  muscle tenderenss along Lt paraspinals along L5 region primarily                 Objective measurements completed on examination: See above findings.      [facility] Adult PT Treatment/Exercise - 03/28/18 0001      Exercises   Exercises  Lumbar      Lumbar Exercises: Supine   Other Supine Lumbar Exercises  low trunk rotation in hooklying x10 reps, HEP demo       Lumbar Exercises: Sidelying   Other Sidelying Lumbar Exercises  thoracic open book stretch x5 reps for HEP demo       Manual Therapy   Manual Therapy  Joint mobilization    Joint Mobilization  Lumbar rotation mobilization grade III x2 bouts into Lt rotation  PT Education - 03/28/18 1113    Education Details  eval findings/POC; benefits of exercise in cases with low back pain; impact limitations in mobility/strength can have on other areas in the body with activity such as golf; implemented and reviewed HEP    Person(s) Educated  Patient    Methods   Explanation;Verbal cues;Handout    Comprehension  Verbalized understanding;Returned demonstration       PT Short Term Goals - 03/28/18 1124      PT SHORT TERM GOAL #1   Title  Pt will demo consistency and independence with his initial HEP to improve flexibility and posture awareness.     Time  4    Period  Weeks    Status  New    Target Date  04/27/18      PT SHORT TERM GOAL #2   Title  Pt will demo good undestanding of lifting mechanics, evident by his ability to lift 10lb box from the floor atleast 5x without the need for therapist cuing.     Time  4    Period  Weeks    Status  New        PT Long Term Goals - 03/28/18 1125      PT LONG TERM GOAL #1   Title  Pt will be independent with his advanced HEP to allow for continued progress and maintenance of his progress after d/c from PT.     Time  8    Period  Weeks    Status  New    Target Date  05/26/18      PT LONG TERM GOAL #2   Title  Pt will report atleast 70% improvement in his low back pain from the start of PT which will allow him to resume his prior recreational activity without limitation.     Time  8    Period  Weeks    Status  New      PT LONG TERM GOAL #3   Title  Pt will have atleast 40 deg of hip IR bilaterally which will assist with his mechanics during golf.    Time  8    Period  Weeks    Status  New      PT LONG TERM GOAL #4   Title  Pt will be able to complete active trunk rotation Lt and Rt without increase in pain, to reflect improvement in thoracic, hip and lumbar mobility.     Time  8    Period  Weeks    Status  New             Plan - 03/28/18 1116    Clinical Impression Statement  Pt is a pleasant 57 y.o M referred to OPPT with chronic LBP of increasing intensity over the past year. He began playing golf a little over a year ago and feels this could possibly have contributed to his increase in pain. He does demonstrate limitations in lumbar ROM, hip rotation and extension ROM, as well  as poor posture awareness sand trunk control. He tries to lead an active lifestyle and this is currently limited by his low back pain and fatigue. He would benefit from skilled PT to address his limitations in hip flexibility, trunk flexibility and trunk strength and neuromuscular control to facilitate return to his leisure and work activity.     History and Personal Factors relevant to plan of care:  chronic low back pain    Clinical Presentation  Stable    Clinical Presentation due to:  gradually worse over the past year     Clinical Decision Making  Low    Rehab Potential  Good    PT Frequency  2x / week   decrease PT frequency as appropriate   PT Duration  8 weeks    PT Treatment/Interventions  ADLs/Self Care Home Management;Moist Heat;Electrical Stimulation;Cryotherapy;Therapeutic exercise;Therapeutic activities;Neuromuscular re-education;Patient/family education;Manual techniques;Passive range of motion;Dry needling    PT Next Visit Plan  possible d/n lumbar paraspinals if pt agreeable; thoracic rotation stretch, lumbar mobility progression, hip rotation stretch, abdominal strength progression    PT Home Exercise Plan  LTR and sidelying thoracic open book stretch    Consulted and Agree with Plan of Care  Patient       Patient will benefit from skilled therapeutic intervention in order to improve the following deficits and impairments:  Decreased activity tolerance, Decreased strength, Impaired flexibility, Postural dysfunction, Pain, Improper body mechanics, Decreased range of motion, Hypomobility, Increased muscle spasms  Visit Diagnosis: Chronic left-sided low back pain, unspecified whether sciatica present  Muscle spasm of back  Abnormal posture     Problem List Patient Active Problem List   Diagnosis Date Noted  . Morbid obesity ([place]) 08/05/2017  . OSA (obstructive sleep apnea) 04/05/2017  . S/P right knee arthroscopy 03/29/2014    11:35 AM,03/28/18 Sherol Dade PT,  DPT [facility] [address] [place], [place], [address] Phone: [phone]   Fax:  [phone]  Name: David Newman MRN: [id number] Date of Birth: [birth year]

## 2018-04-04 ENCOUNTER — Ambulatory Visit: Payer: 59

## 2018-04-04 DIAGNOSIS — R293 Abnormal posture: Secondary | ICD-10-CM | POA: Diagnosis not present

## 2018-04-04 DIAGNOSIS — G8929 Other chronic pain: Secondary | ICD-10-CM

## 2018-04-04 DIAGNOSIS — M545 Low back pain, unspecified: Secondary | ICD-10-CM

## 2018-04-04 DIAGNOSIS — M62830 Muscle spasm of back: Secondary | ICD-10-CM

## 2018-04-04 NOTE — Patient Instructions (Signed)
Access Code: [email or web address]  URL: [email or web address]  Date: 04/04/2018  Prepared by: Sigurd Sos   Exercises  Supine Lower Trunk Rotation - 3 reps - 20 hold - 3x daily - 7x weekly  Seated Thoracic Flexion and Rotation with Arms Crossed - 3 reps - 20 hold - 3x daily - 7x weekly  Seated Hamstring Stretch - 3 reps - 3 sets - 20 hold - 3x daily - 7x weekly  Seated Figure 4 Piriformis Stretch - 3 reps - 20 hold - 3x daily - 7x weekly  Seated Correct Posture - 1x daily - 7x weekly  Seated Transversus Abdominis Bracing - 10 reps - 3 sets - 1x daily - 7x weekly

## 2018-04-04 NOTE — Therapy (Addendum)
[facility] [address] [place], [place], [address] Phone: [phone]   Fax:  [phone]  Physical Therapy Treatment  Patient Details  Name: David Newman MRN: [id number] Date of Birth: 04/23/1961 Referring Provider (David Newman): Gerrit Halls, PA-C   Encounter Date: 04/04/2018  David Newman End of Session - 04/04/18 0801    Visit Number  2    Date for David Newman David-Evaluation  05/26/18    Authorization Type  [employer]    David Newman Start Time  [phone]    David Newman Stop Time  0800    David Newman Time Calculation (min)  31 min    Activity Tolerance  No increased pain;Patient tolerated treatment well    Behavior During Therapy  [facility] for tasks assessed/performed       Past Medical History:  Diagnosis Date  . Anal fissure   . At risk for sleep apnea    STOP-BANG= 5    SENT TO PCP 03-27-2014  . CAD (coronary artery disease)   . Colon polyps    hyperplastic  . GERD (gastroesophageal reflux disease)   . Hyperlipidemia   . OA (osteoarthritis) of knee   . Right knee meniscal tear   . 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 Wears contact lenses   . Wears glasses     Past Surgical History:  Procedure Laterality Date  . COLONOSCOPY  04-24-2012  . FACIAL RECONSTRUCTION SURGERY  1973  age 20  . KNEE ARTHROSCOPY Right 03/29/2014   Procedure: RIGHT ARTHROSCOPY KNEE WITH DEBRIDMENT PARTIAL medial lateral MENISCECTOMY AND CHONDROPLASTY;  Surgeon: Sydnee Cabal, MD;  Location: [facility];  Service: Orthopedics;  Laterality: Right;  . KNEE ARTHROSCOPY WITH ANTERIOR CRUCIATE LIGAMENT (ACL) REPAIR Right 1994  . WISDOM TOOTH EXTRACTION  age 15    There were no vitals filed for this visit.  Subjective Assessment - 04/04/18 0733    Subjective  I have done my stretches at least 1x/day.  I get sore with housework.    Currently in Pain?  Yes    Pain Score  0-No pain   up to 4/10 with increased activity   Pain Location  Back    Pain Orientation   Lower;Left    Pain Descriptors / Indicators  Aching    Pain Type  Chronic pain    Pain Onset  More than a month ago    Pain Frequency  Intermittent    Aggravating Factors   increased activity, standing and walking    Pain Relieving Factors  stretching, medication, change of position                       [facility] Adult David Newman Treatment/Exercise - 04/04/18 0001      Exercises   Exercises  Knee/Hip;Lumbar      Lumbar Exercises: Stretches   Active Hamstring Stretch  Left;Right;3 reps;20 seconds    Lower Trunk Rotation  3 reps;20 seconds    Piriformis Stretch  3 reps;Left;Right;20 seconds   seated     Lumbar Exercises: Seated   Other Seated Lumbar Exercises  abdominal bracing    Other Seated Lumbar Exercises  thoracic rotation      Lumbar Exercises: Sidelying   Other Sidelying Lumbar Exercises  thoracic open book stretch x10             David Newman Education - 04/04/18 0751    Education Details   Access Code: [email or web address]     Person(s) Educated  Patient    Methods  Explanation;Demonstration;Handout    Comprehension  Verbalized understanding;Returned demonstration       David Newman Short Term Goals - 03/28/18 1124      David Newman SHORT TERM GOAL #1   Title  David Newman will demo consistency and independence with his initial HEP to improve flexibility and posture awareness.     Time  4    Period  Weeks    Status  New    Target Date  04/27/18      David Newman SHORT TERM GOAL #2   Title  David Newman will demo good undestanding of lifting mechanics, evident by his ability to lift 10lb box from the floor atleast 5x without the need for therapist cuing.     Time  4    Period  Weeks    Status  New        David Newman Long Term Goals - 03/28/18 1125      David Newman LONG TERM GOAL #1   Title  David Newman will be independent with his advanced HEP to allow for continued progress and maintenance of his progress after d/c from David Newman.     Time  8    Period  Weeks    Status  New    Target Date  05/26/18      David Newman LONG TERM GOAL #2   Title  David Newman  will report atleast 70% improvement in his low back pain from the start of David Newman which will allow him to resume his prior recreational activity without limitation.     Time  8    Period  Weeks    Status  New      David Newman LONG TERM GOAL #3   Title  David Newman will have atleast 40 deg of hip IR bilaterally which will assist with his mechanics during golf.    Time  8    Period  Weeks    Status  New      David Newman LONG TERM GOAL #4   Title  David Newman will be able to complete active trunk rotation Lt and Rt without increase in pain, to reflect improvement in thoracic, hip and lumbar mobility.     Time  8    Period  Weeks    Status  New      Plan:   David Newman with first time follow up after evalution.  David Newman is independent in initial HEP and David Newman added to HEP for flexibility with emphasis on hip and thoracic/lumbar spine.  David Newman also provided education regarding posture and abdominal bracing.  David Newman required minor verbal cues for techinque during treatment.  David Newman with reduced spinal mobility and will continue to benefit from skilled David Newman to address strength, flexibility and postural awareness.     2x/wk for 8 weeks  Next session: review HEP, add scapular theraband/strength, arm bike, thoracic mobs     Patient will benefit from skilled therapeutic intervention in order to improve the following deficits and impairments:  Decreased activity tolerance, Decreased strength, Impaired flexibility, Postural dysfunction, Pain, Improper body mechanics, Decreased range of motion, Hypomobility, Increased muscle spasms  Visit Diagnosis: Chronic left-sided low back pain, unspecified whether sciatica present  Muscle spasm of back  Abnormal posture     Problem List Patient Active Problem List   Diagnosis Date Noted  . Morbid obesity ([place]) 08/05/2017  . OSA (obstructive sleep apnea) 04/05/2017  . S/P right knee arthroscopy 03/29/2014    David Newman, David Newman 04/05/18 7:54 AM  [facility]  [address] [place], [place], [address] Phone: [phone]   Fax:  [phone]  Name: David Newman MRN: [id number] Date of Birth: 1961-02-17

## 2018-04-06 ENCOUNTER — Ambulatory Visit: Payer: 59

## 2018-04-06 DIAGNOSIS — M545 Low back pain, unspecified: Secondary | ICD-10-CM

## 2018-04-06 DIAGNOSIS — G8929 Other chronic pain: Secondary | ICD-10-CM

## 2018-04-06 DIAGNOSIS — M62830 Muscle spasm of back: Secondary | ICD-10-CM

## 2018-04-06 DIAGNOSIS — R293 Abnormal posture: Secondary | ICD-10-CM

## 2018-04-06 NOTE — Patient Instructions (Signed)
Access Code: [email or web address]  URL: [email or web address]  Date: 04/06/2018  Prepared by: Sigurd Sos   Exercises clam shells sidelying  Supine Straight Leg Raises - 10 reps - 2 sets - 2x daily - 7x weekly  Supine Hamstring Stretch with Strap - 3 reps - 2 sets - 2x daily - 7x weekly

## 2018-04-06 NOTE — Therapy (Signed)
[facility] [address] [place], [place], [address] Phone: [phone]   Fax:  [phone]  Physical Therapy Treatment  Patient Details  Name: David Newman MRN: [id number] Date of Birth: 08/25/1960 Referring Provider (PT): Gerrit Halls, PA-C   Encounter Date: 04/06/2018  PT End of Session - 04/06/18 0836    Visit Number  3    Date for PT Re-Evaluation  05/26/18    Authorization Type  [facility]    PT Start Time  0802    PT Stop Time  0846    PT Time Calculation (min)  44 min    Activity Tolerance  No increased pain;Patient tolerated treatment well    Behavior During Therapy  [facility] for tasks assessed/performed       Past Medical History:  Diagnosis Date  . Anal fissure   . At risk for sleep apnea    STOP-BANG= 5    SENT TO PCP 03-27-2014  . CAD (coronary artery disease)   . Colon polyps    hyperplastic  . GERD (gastroesophageal reflux disease)   . Hyperlipidemia   . OA (osteoarthritis) of knee   . Right knee meniscal tear   . 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 Wears contact lenses   . Wears glasses     Past Surgical History:  Procedure Laterality Date  . COLONOSCOPY  04-24-2012  . FACIAL RECONSTRUCTION SURGERY  1973  age 80  . KNEE ARTHROSCOPY Right 03/29/2014   Procedure: RIGHT ARTHROSCOPY KNEE WITH DEBRIDMENT PARTIAL medial lateral MENISCECTOMY AND CHONDROPLASTY;  Surgeon: Sydnee Cabal, MD;  Location: [place];  Service: Orthopedics;  Laterality: Right;  . KNEE ARTHROSCOPY WITH ANTERIOR CRUCIATE LIGAMENT (ACL) REPAIR Right 1994  . WISDOM TOOTH EXTRACTION  age 66    There were no vitals filed for this visit.  Subjective Assessment - 04/06/18 0805    Subjective  I am doing well with stretches.  I lifted a sleeper sofa the other day and I was sore.  I am more aware of my posture.      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  No/denies                       [facility] Adult PT Treatment/Exercise - 04/06/18 0001      Exercises   Exercises  Knee/Hip;Lumbar      Lumbar Exercises: Stretches   Active Hamstring Stretch  Left;Right;3 reps;20 seconds   supine with strap   Piriformis Stretch  3 reps;Left;Right;20 seconds   seated     Lumbar Exercises: Seated   Other Seated Lumbar Exercises  abdominal bracing      Lumbar Exercises: Supine   Straight Leg Raise  20 reps   with abdominal bracing     Lumbar Exercises: Sidelying   Other Sidelying Lumbar Exercises  thoracic open book stretch x10      Knee/Hip Exercises: Sidelying   Clams  x 20       Manual Therapy   Manual Therapy  Joint mobilization    Manual therapy comments  PA and rotation mobs to lumbar spine grade 2-3             PT Education - 04/06/18 0827    Education Details  Access Code: [email or web address]     Person(s) Educated  Patient  Methods  Explanation;Demonstration;Handout    Comprehension  Verbalized understanding;Returned demonstration       PT Short Term Goals - 03/28/18 1124      PT SHORT TERM GOAL #1   Title  Pt will demo consistency and independence with his initial HEP to improve flexibility and posture awareness.     Time  4    Period  Weeks    Status  New    Target Date  04/27/18      PT SHORT TERM GOAL #2   Title  Pt will demo good undestanding of lifting mechanics, evident by his ability to lift 10lb box from the floor atleast 5x without the need for therapist cuing.     Time  4    Period  Weeks    Status  New        PT Long Term Goals - 03/28/18 1125      PT LONG TERM GOAL #1   Title  Pt will be independent with his advanced HEP to allow for continued progress and maintenance of his progress after d/c from PT.     Time  8    Period  Weeks    Status  New    Target Date  05/26/18      PT LONG TERM GOAL #2   Title  Pt will report  atleast 70% improvement in his low back pain from the start of PT which will allow him to resume his prior recreational activity without limitation.     Time  8    Period  Weeks    Status  New      PT LONG TERM GOAL #3   Title  Pt will have atleast 40 deg of hip IR bilaterally which will assist with his mechanics during golf.    Time  8    Period  Weeks    Status  New      PT LONG TERM GOAL #4   Title  Pt will be able to complete active trunk rotation Lt and Rt without increase in pain, to reflect improvement in thoracic, hip and lumbar mobility.     Time  8    Period  Weeks    Status  New            Plan - 04/06/18 0808    Clinical Impression Statement  Pt is making postural corrections at home and work and continues to demonstrate slumped seated posture.  Pt is stretching intermittently during the day and is able to demonstrate all aspects correctly.  Pt with weak core and hip and therapy focused on strength throughout the session.  Pt will continue to benefit from skilled PT for strength, flexibility and body mechanics modifications.      Rehab Potential  Good    PT Frequency  2x / week    PT Duration  8 weeks    PT Treatment/Interventions  ADLs/Self Care Home Management;Moist Heat;Electrical Stimulation;Cryotherapy;Therapeutic exercise;Therapeutic activities;Neuromuscular re-education;Patient/family education;Manual techniques;Passive range of motion;Dry needling    PT Next Visit Plan  review HEP, spinal mobility, add theraband for thoracic    PT Home Exercise Plan  Access Code: [email or web address]     Consulted and Agree with Plan of Care  Patient       Patient will benefit from skilled therapeutic intervention in order to improve the following deficits and impairments:  Decreased activity tolerance, Decreased strength, Impaired flexibility, Postural dysfunction, Pain, Improper body mechanics, Decreased range of motion, Hypomobility, Increased  muscle spasms  Visit Diagnosis: Chronic  left-sided low back pain, unspecified whether sciatica present  Muscle spasm of back  Abnormal posture     Problem List Patient Active Problem List   Diagnosis Date Noted  . Morbid obesity ([place]) 08/05/2017  . OSA (obstructive sleep apnea) 04/05/2017  . S/P right knee arthroscopy 03/29/2014    Sigurd Sos, PT 04/06/18 8:49 AM  [facility] [address] [place], [place], [address] Phone: [phone]   Fax:  [phone]  Name: AURELIO MCCAMY MRN: [id number] Date of Birth: December 21, 1960

## 2018-04-10 ENCOUNTER — Ambulatory Visit: Payer: 59

## 2018-04-17 DIAGNOSIS — M1711 Unilateral primary osteoarthritis, right knee: Secondary | ICD-10-CM | POA: Diagnosis not present

## 2018-04-18 ENCOUNTER — Ambulatory Visit: Payer: 59 | Attending: Physician Assistant

## 2018-04-18 DIAGNOSIS — M62830 Muscle spasm of back: Secondary | ICD-10-CM | POA: Insufficient documentation

## 2018-04-18 DIAGNOSIS — G8929 Other chronic pain: Secondary | ICD-10-CM | POA: Insufficient documentation

## 2018-04-18 DIAGNOSIS — M545 Low back pain, unspecified: Secondary | ICD-10-CM

## 2018-04-18 DIAGNOSIS — R293 Abnormal posture: Secondary | ICD-10-CM | POA: Diagnosis not present

## 2018-04-18 NOTE — Therapy (Signed)
[facility] [address], [place], [place], [address] Phone: [phone]   Fax:  [phone]  Physical Therapy Treatment  Patient Details  Name: David Newman MRN: [id number] Date of Birth: February 16, 1961 Referring Provider (PT): Gerrit Halls, PA-C   Encounter Date: 04/18/2018  PT End of Session - 04/18/18 0804    Visit Number  4    Date for PT Re-Evaluation  05/26/18    Authorization Type  [facility]    PT Start Time  0086    PT Stop Time  0806    PT Time Calculation (min)  35 min    Activity Tolerance  No increased pain;Patient tolerated treatment well    Behavior During Therapy  [facility] for tasks assessed/performed       Past Medical History:  Diagnosis Date  . Anal fissure   . At risk for sleep apnea    STOP-BANG= 5    SENT TO PCP 03-27-2014  . CAD (coronary artery disease)   . Colon polyps    hyperplastic  . GERD (gastroesophageal reflux disease)   . Hyperlipidemia   . OA (osteoarthritis) of knee   . Right knee meniscal tear   . 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 Wears contact lenses   . Wears glasses     Past Surgical History:  Procedure Laterality Date  . COLONOSCOPY  04-24-2012  . FACIAL RECONSTRUCTION SURGERY  1973  age 31  . KNEE ARTHROSCOPY Right 03/29/2014   Procedure: RIGHT ARTHROSCOPY KNEE WITH DEBRIDMENT PARTIAL medial lateral MENISCECTOMY AND CHONDROPLASTY;  Surgeon: Sydnee Cabal, MD;  Location: [place];  Service: Orthopedics;  Laterality: Right;  . KNEE ARTHROSCOPY WITH ANTERIOR CRUCIATE LIGAMENT (ACL) REPAIR Right 1994  . WISDOM TOOTH EXTRACTION  age 25    There were no vitals filed for this visit.  Subjective Assessment - 04/18/18 0737    Subjective  I am doing well.  I was very busy over the Thanksgiving weekend.      Diagnostic tests  degenerative changes in lumbar spine (L5/S1)    Patient Stated Goals  decrease pain     Currently in Pain?   No/denies    Pain Score  --   pain increases to 3/10 with yardwork and lifting   Pain Location  Back    Pain Orientation  Left    Pain Descriptors / Indicators  Aching    Pain Type  Chronic pain    Pain Onset  More than a month ago    Pain Frequency  Intermittent    Aggravating Factors   yardwork, lifting    Pain Relieving Factors  stretching, medication, change of position         [facility] PT Assessment - 04/18/18 0001      Assessment   Medical Diagnosis  Lumbar DDD      Palpation   Spinal mobility  hypomobile thoracic and lumbar region    Palpation comment  muscle tenderenss along Lt paraspinals along L5 region primarily                    [facility] Adult PT Treatment/Exercise - 04/18/18 0001      Exercises   Exercises  Knee/Hip;Lumbar      Lumbar Exercises: Stretches   Active Hamstring Stretch  Left;Right;3 reps;20 seconds   supine with strap     Lumbar Exercises: Seated   Other Seated Lumbar Exercises  abdominal bracing    Other Seated  Lumbar Exercises  red theraband: horizontal abduction and ER with abdominal bracing 2x10      Lumbar Exercises: Supine   Straight Leg Raise  20 reps   with abdominal bracing     Knee/Hip Exercises: Sidelying   Clams  x 20              PT Education - 04/18/18 0806    Education Details  body mechanics education    Person(s) Educated  Patient    Methods  Explanation;Demonstration    Comprehension  Verbalized understanding;Returned demonstration       PT Short Term Goals - 04/18/18 0739      PT SHORT TERM GOAL #1   Title  Pt will demo consistency and independence with his initial HEP to improve flexibility and posture awareness.     Status  Achieved      PT SHORT TERM GOAL #2   Title  Pt will demo good undestanding of lifting mechanics, evident by his ability to lift 10lb box from the floor atleast 5x without the need for therapist cuing.     Status  Achieved        PT Long Term Goals - 03/28/18 1125       PT LONG TERM GOAL #1   Title  Pt will be independent with his advanced HEP to allow for continued progress and maintenance of his progress after d/c from PT.     Time  8    Period  Weeks    Status  New    Target Date  05/26/18      PT LONG TERM GOAL #2   Title  Pt will report atleast 70% improvement in his low back pain from the start of PT which will allow him to resume his prior recreational activity without limitation.     Time  8    Period  Weeks    Status  New      PT LONG TERM GOAL #3   Title  Pt will have atleast 40 deg of hip IR bilaterally which will assist with his mechanics during golf.    Time  8    Period  Weeks    Status  New      PT LONG TERM GOAL #4   Title  Pt will be able to complete active trunk rotation Lt and Rt without increase in pain, to reflect improvement in thoracic, hip and lumbar mobility.     Time  8    Period  Weeks    Status  New            Plan - 04/18/18 0755    Clinical Impression Statement  Pt is doing well with consistency with exercises and postural corrections.  Pt is able to perform lifting with good mechanics and received body mechanics education.  Pt reports that he has LBP with standing, yardwork and lifting up to 3/10.  Pt with reduced mobility in the thoracic and lumbar segments with mobs today.  Pt will see MD tomorrow to discuss progress.  Pt with weak abdominals and limited flexibility and will continue to benefit from skilled PT for core strength, flexibility and pain management as needed.      Rehab Potential  Good    PT Frequency  2x / week    PT Duration  8 weeks    PT Treatment/Interventions  ADLs/Self Care Home Management;Moist Heat;Electrical Stimulation;Cryotherapy;Therapeutic exercise;Therapeutic activities;Neuromuscular re-education;Patient/family education;Manual techniques;Passive range of motion;Dry needling  PT Next Visit Plan  see what MD says, spinal mobility, add theraband for thoracic to HEP    PT Home Exercise  Plan  Access Code: [email or web address]     Consulted and Agree with Plan of Care  Patient       Patient will benefit from skilled therapeutic intervention in order to improve the following deficits and impairments:  Decreased activity tolerance, Decreased strength, Impaired flexibility, Postural dysfunction, Pain, Improper body mechanics, Decreased range of motion, Hypomobility, Increased muscle spasms  Visit Diagnosis: Chronic left-sided low back pain, unspecified whether sciatica present  Muscle spasm of back  Abnormal posture     Problem List Patient Active Problem List   Diagnosis Date Noted  . Morbid obesity ([place]) 08/05/2017  . OSA (obstructive sleep apnea) 04/05/2017  . S/P right knee arthroscopy 03/29/2014   Sigurd Sos, PT 04/18/18 8:08 AM  [facility] [address] [place], [place], [address] Phone: [phone]   Fax:  [phone]  Name: David Newman MRN: [id number] Date of Birth: Nov 21, 1960

## 2018-04-19 DIAGNOSIS — M419 Scoliosis, unspecified: Secondary | ICD-10-CM | POA: Diagnosis not present

## 2018-04-19 DIAGNOSIS — M519 Unspecified thoracic, thoracolumbar and lumbosacral intervertebral disc disorder: Secondary | ICD-10-CM | POA: Diagnosis not present

## 2018-04-19 DIAGNOSIS — K529 Noninfective gastroenteritis and colitis, unspecified: Secondary | ICD-10-CM | POA: Diagnosis not present

## 2018-04-19 DIAGNOSIS — M545 Low back pain: Secondary | ICD-10-CM | POA: Diagnosis not present

## 2018-04-19 DIAGNOSIS — M5136 Other intervertebral disc degeneration, lumbar region: Secondary | ICD-10-CM | POA: Diagnosis not present

## 2018-04-19 DIAGNOSIS — M999 Biomechanical lesion, unspecified: Secondary | ICD-10-CM | POA: Diagnosis not present

## 2018-04-20 ENCOUNTER — Ambulatory Visit: Payer: 59 | Admitting: Physical Therapy

## 2018-04-20 ENCOUNTER — Encounter: Payer: Self-pay | Admitting: Physical Therapy

## 2018-04-20 DIAGNOSIS — G8929 Other chronic pain: Secondary | ICD-10-CM

## 2018-04-20 DIAGNOSIS — M62830 Muscle spasm of back: Secondary | ICD-10-CM | POA: Diagnosis not present

## 2018-04-20 DIAGNOSIS — M4800 Spinal stenosis, site unspecified: Secondary | ICD-10-CM | POA: Insufficient documentation

## 2018-04-20 DIAGNOSIS — R293 Abnormal posture: Secondary | ICD-10-CM

## 2018-04-20 DIAGNOSIS — M545 Low back pain, unspecified: Secondary | ICD-10-CM | POA: Insufficient documentation

## 2018-04-20 NOTE — Therapy (Signed)
[facility] [address], [place], [place], [address] Phone: [phone]   Fax:  [phone]  Physical Therapy Treatment  Patient Details  Name: David Newman MRN: [id number] Date of Birth: 25-Dec-1960 Referring Provider (PT): Gerrit Halls, PA-C   Encounter Date: 04/20/2018  PT End of Session - 04/20/18 1648    Visit Number  5    Date for PT Re-Evaluation  05/26/18    Authorization Type  [employer]    PT Start Time  5364    PT Stop Time  1700    PT Time Calculation (min)  43 min    Activity Tolerance  No increased pain;Patient tolerated treatment well    Behavior During Therapy  [facility] for tasks assessed/performed       Past Medical History:  Diagnosis Date  . Anal fissure   . At risk for sleep apnea    STOP-BANG= 5    SENT TO PCP 03-27-2014  . CAD (coronary artery disease)   . Colon polyps    hyperplastic  . GERD (gastroesophageal reflux disease)   . Hyperlipidemia   . OA (osteoarthritis) of knee   . Right knee meniscal tear   . 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 Wears contact lenses   . Wears glasses     Past Surgical History:  Procedure Laterality Date  . COLONOSCOPY  04-24-2012  . FACIAL RECONSTRUCTION SURGERY  1973  age 45  . KNEE ARTHROSCOPY Right 03/29/2014   Procedure: RIGHT ARTHROSCOPY KNEE WITH DEBRIDMENT PARTIAL medial lateral MENISCECTOMY AND CHONDROPLASTY;  Surgeon: Sydnee Cabal, MD;  Location: [place];  Service: Orthopedics;  Laterality: Right;  . KNEE ARTHROSCOPY WITH ANTERIOR CRUCIATE LIGAMENT (ACL) REPAIR Right 1994  . WISDOM TOOTH EXTRACTION  age 41    There were no vitals filed for this visit.  Subjective Assessment - 04/20/18 1621    Subjective  Pt reports that things are going well. He saw the physician who told him that his nerve is significantly compressed. This worried him. He does feel like things are improving some, however.    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  No/denies    Pain Onset  More than a month ago                       [facility] Adult PT Treatment/Exercise - 04/20/18 0001      Exercises   Exercises  Knee/Hip;Lumbar      Lumbar Exercises: Stretches   Piriformis Stretch  Left;1 rep;30 seconds    Other Lumbar Stretch Exercise  gentle child's pose stretch x3 reps, 10 sec hold       Lumbar Exercises: Supine   Other Supine Lumbar Exercises  hooklying low trunk rotation x15 reps each     Other Supine Lumbar Exercises  B knees to chest stretch with LE on red physioball x20 reps       Lumbar Exercises: Quadruped   Other Quadruped Lumbar Exercises  threading the needle x15 reps Lt and Rt              PT Education - 04/20/18 1644    Education Details  technique with therex; updated HEP; dry needling info; importance of increasing mobility in the hip/spine as well as strength to allow him to maintain proper posture, etc.  Person(s) Educated  Patient    Methods  Explanation;Handout    Comprehension  Verbalized understanding;Returned demonstration       PT Short Term Goals - 04/20/18 1719      PT SHORT TERM GOAL #1   Title  Pt will demo consistency and independence with his initial HEP to improve flexibility and posture awareness.     Status  Achieved      PT SHORT TERM GOAL #2   Title  Pt will demo good undestanding of lifting mechanics, evident by his ability to lift 10lb box from the floor atleast 5x without the need for therapist cuing.     Status  Achieved        PT Long Term Goals - 03/28/18 1125      PT LONG TERM GOAL #1   Title  Pt will be independent with his advanced HEP to allow for continued progress and maintenance of his progress after d/c from PT.     Time  8    Period  Weeks    Status  New    Target Date  05/26/18      PT LONG TERM GOAL #2   Title  Pt will report atleast  70% improvement in his low back pain from the start of PT which will allow him to resume his prior recreational activity without limitation.     Time  8    Period  Weeks    Status  New      PT LONG TERM GOAL #3   Title  Pt will have atleast 40 deg of hip IR bilaterally which will assist with his mechanics during golf.    Time  8    Period  Weeks    Status  New      PT LONG TERM GOAL #4   Title  Pt will be able to complete active trunk rotation Lt and Rt without increase in pain, to reflect improvement in thoracic, hip and lumbar mobility.     Time  8    Period  Weeks    Status  New            Plan - 04/20/18 1718    Clinical Impression Statement  Today's session focused on therex to improve thoracic and lumbar mobility. Therapist was able to address concerns pt had regarding his MD appointment. Pt's HEP was updated and he was able to demonstrate good understanding of the adjustments. He is interested in starting his home gym equipment again and was encouraged to do this for further strengthening. Pt was educated on dry needling and was agreeable end of session, but due to time constraints this was deferred to his next treatment session. Will continue with current POC.    Rehab Potential  Good    PT Frequency  2x / week    PT Duration  8 weeks    PT Treatment/Interventions  ADLs/Self Care Home Management;Moist Heat;Electrical Stimulation;Cryotherapy;Therapeutic exercise;Therapeutic activities;Neuromuscular re-education;Patient/family education;Manual techniques;Passive range of motion;Dry needling    PT Next Visit Plan  pt agreeable to dry needling lumbar spine; thoracic mobility/mobilization; lumbar stabilization progression    PT Home Exercise Plan  Access Code: [email or web address]     Consulted and Agree with Plan of Care  Patient       Patient will benefit from skilled therapeutic intervention in order to improve the following deficits and impairments:  Decreased activity tolerance,  Decreased strength, Impaired flexibility, Postural dysfunction, Pain, Improper body mechanics, Decreased range of  motion, Hypomobility, Increased muscle spasms  Visit Diagnosis: Chronic left-sided low back pain, unspecified whether sciatica present  Muscle spasm of back  Abnormal posture     Problem List Patient Active Problem List   Diagnosis Date Noted  . Morbid obesity ([place]) 08/05/2017  . OSA (obstructive sleep apnea) 04/05/2017  . S/P right knee arthroscopy 03/29/2014   5:27 PM,04/20/18 Lakeena Downie PT, DPT [place] [facility] [address], [place], [place], [address] Phone: [phone]   Fax:  [phone]  Name: David Newman MRN: [id number] Date of Birth: [birth year]

## 2018-04-20 NOTE — Patient Instructions (Addendum)
Trigger Point Dry Needling  . What is Trigger Point Dry Needling (DN)? o DN is a physical therapy technique used to treat muscle pain and dysfunction. Specifically, DN helps deactivate muscle trigger points (muscle knots).  o A thin filiform needle is used to penetrate the skin and stimulate the underlying trigger point. The goal is for a local twitch response (LTR) to occur and for the trigger point to relax. No medication of any kind is injected during the procedure.   . What Does Trigger Point Dry Needling Feel Like?  o The procedure feels different for each individual patient. Some patients report that they do not actually feel the needle enter the skin and overall the process is not painful. Very mild bleeding may occur. However, many patients feel a deep cramping in the muscle in which the needle was inserted. This is the local twitch response.   Marland Kitchen How Will I feel after the treatment? o Soreness is normal, and the onset of soreness may not occur for a few hours. Typically this soreness does not last longer than two days.  o Bruising is uncommon, however; ice can be used to decrease any possible bruising.  o In rare cases feeling tired or nauseous after the treatment is normal. In addition, your symptoms may get worse before they get better, this period will typically not last longer than 24 hours.   . What Can I do After My Treatment? o Increase your hydration by drinking more water for the next 24 hours. o You may place ice or heat on the areas treated that have become sore, however, do not use heat on inflamed or bruised areas. Heat often brings more relief post needling. o You can continue your regular activities, but vigorous activity is not recommended initially after the treatment for 24 hours. o DN is best combined with other physical therapy such as strengthening, stretching, and other therapies.    Access Code: [email or web address]  URL: [email or web address]  Date: 04/20/2018   Prepared by: Sherol Dade   Exercises  Sidelying Thoracic Lumbar Rotation - 15 reps - 2x daily - 7x weekly  Supine Lower Trunk Rotation - 3 reps - 20 hold - 3x daily - 7x weekly  Seated Figure 4 Piriformis Stretch - 3 reps - 20 hold - 3x daily - 7x weekly  Seated Correct Posture - 1x daily - 7x weekly  Seated Transversus Abdominis Bracing - 10 reps - 3 sets - 1x daily - 7x weekly  Clamshell - 10 reps - 2 sets - 2x daily - 7x weekly  Supine Straight Leg Raises - 10 reps - 2 sets - 2x daily - 7x weekly    [facility] [address] [place], [place] [address] Phone # [phone] Fax [phone]

## 2018-04-24 ENCOUNTER — Other Ambulatory Visit (HOSPITAL_COMMUNITY): Payer: Self-pay | Admitting: Specialist

## 2018-04-24 DIAGNOSIS — M545 Low back pain, unspecified: Secondary | ICD-10-CM

## 2018-04-24 DIAGNOSIS — M1711 Unilateral primary osteoarthritis, right knee: Secondary | ICD-10-CM | POA: Diagnosis not present

## 2018-04-25 ENCOUNTER — Ambulatory Visit: Payer: 59

## 2018-04-25 DIAGNOSIS — M545 Low back pain, unspecified: Secondary | ICD-10-CM

## 2018-04-25 DIAGNOSIS — R293 Abnormal posture: Secondary | ICD-10-CM | POA: Diagnosis not present

## 2018-04-25 DIAGNOSIS — G8929 Other chronic pain: Secondary | ICD-10-CM

## 2018-04-25 DIAGNOSIS — M62830 Muscle spasm of back: Secondary | ICD-10-CM

## 2018-04-25 NOTE — Therapy (Signed)
[facility] [address] [place], [place], [address] Phone: [phone]   Fax:  [phone]  Physical Therapy Treatment  Patient Details  Name: David Newman MRN: [id number] Date of Birth: [birth year] Referring Provider (PT): Gerrit Halls, PA-C   Encounter Date: 04/25/2018  PT End of Session - 04/25/18 0853    Visit Number  6    Date for PT Re-Evaluation  05/26/18    Authorization Type  [facility]    PT Start Time  4650    PT Stop Time  0848    PT Time Calculation (min)  42 min    Activity Tolerance  Patient tolerated treatment well    Behavior During Therapy  [facility] for tasks assessed/performed       Past Medical History:  Diagnosis Date  . Anal fissure   . At risk for sleep apnea    STOP-BANG= 5    SENT TO PCP 03-27-2014  . CAD (coronary artery disease)   . Colon polyps    hyperplastic  . GERD (gastroesophageal reflux disease)   . Hyperlipidemia   . OA (osteoarthritis) of knee   . Right knee meniscal tear   . 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 Wears contact lenses   . Wears glasses     Past Surgical History:  Procedure Laterality Date  . COLONOSCOPY  04-24-2012  . FACIAL RECONSTRUCTION SURGERY  1973  age 66  . KNEE ARTHROSCOPY Right 03/29/2014   Procedure: RIGHT ARTHROSCOPY KNEE WITH DEBRIDMENT PARTIAL medial lateral MENISCECTOMY AND CHONDROPLASTY;  Surgeon: Sydnee Cabal, MD;  Location: [place];  Service: Orthopedics;  Laterality: Right;  . KNEE ARTHROSCOPY WITH ANTERIOR CRUCIATE LIGAMENT (ACL) REPAIR Right 1994  . WISDOM TOOTH EXTRACTION  age 77    There were no vitals filed for this visit.  Subjective Assessment - 04/25/18 0809    Subjective  I am doing well.  Dr Tonita Cong ordered MRI    Pertinent History  Rt knee scope and ACL repair    Patient Stated Goals  decrease pain     Currently in Pain?  No/denies                       [facility] Adult PT  Treatment/Exercise - 04/25/18 0001      Exercises   Exercises  Knee/Hip;Lumbar      Lumbar Exercises: Stretches   Active Hamstring Stretch  Left;Right;3 reps;20 seconds   supine with strap     Lumbar Exercises: Standing   Row  --    Theraband Level (Row)  --    Shoulder Extension  --    Theraband Level (Shoulder Extension)  --      Lumbar Exercises: Seated   Other Seated Lumbar Exercises  horizontal abduction and ER with green band.  2x10  Emphasis on neutral seated posture.        Manual Therapy   Manual Therapy  Joint mobilization    Manual therapy comments  PA mobs T11-L5 grade 3 and bil lumbar paraspinal mobilization       Trigger Point Dry Needling - 04/25/18 0848    Consent Given?  Yes    Education Handout Provided  Yes    Muscles Treated Lower Body  --   bil lumbar multifidi T12-L5          PT Education - 04/25/18 0827    Education Details   Access Code: [email or web address]  Person(s) Educated  Patient    Methods  Explanation;Demonstration;Handout    Comprehension  Returned demonstration;Verbalized understanding       PT Short Term Goals - 04/20/18 1719      PT SHORT TERM GOAL #1   Title  Pt will demo consistency and independence with his initial HEP to improve flexibility and posture awareness.     Status  Achieved      PT SHORT TERM GOAL #2   Title  Pt will demo good undestanding of lifting mechanics, evident by his ability to lift 10lb box from the floor atleast 5x without the need for therapist cuing.     Status  Achieved        PT Long Term Goals - 03/28/18 1125      PT LONG TERM GOAL #1   Title  Pt will be independent with his advanced HEP to allow for continued progress and maintenance of his progress after d/c from PT.     Time  8    Period  Weeks    Status  New    Target Date  05/26/18      PT LONG TERM GOAL #2   Title  Pt will report atleast 70% improvement in his low back pain from the start of PT which will allow him to resume his prior  recreational activity without limitation.     Time  8    Period  Weeks    Status  New      PT LONG TERM GOAL #3   Title  Pt will have atleast 40 deg of hip IR bilaterally which will assist with his mechanics during golf.    Time  8    Period  Weeks    Status  New      PT LONG TERM GOAL #4   Title  Pt will be able to complete active trunk rotation Lt and Rt without increase in pain, to reflect improvement in thoracic, hip and lumbar mobility.     Time  8    Period  Weeks    Status  New            Plan - 04/25/18 0900    Clinical Impression Statement  Pt continues to demonstrate weak abdominals and is making postural corrections at home and work.  Posture is improved overall yet pt requires frequent verbal cues for neutral pelvis and abdominal bracing with sitting and standing.  PT added seated lumbar strength to HEP.  Pt with tension and trigger points in bil lumbar paraspinals and reduced spinal mobility and demonstrated improved mobility after dry needling today.  Pt will continue to benefit from skilled PT for core strength, mobility and LE strength.      Rehab Potential  Good    PT Frequency  2x / week    PT Duration  8 weeks    PT Treatment/Interventions  ADLs/Self Care Home Management;Moist Heat;Electrical Stimulation;Cryotherapy;Therapeutic exercise;Therapeutic activities;Neuromuscular re-education;Patient/family education;Manual techniques;Passive range of motion;Dry needling    PT Next Visit Plan  assess response to dry needling and repeat if helpful.  Core strength, neutral posture    PT Home Exercise Plan  Access Code: [email or web address]     Recommended Other Services  initial cert is signed    Consulted and Agree with Plan of Care  Patient       Patient will benefit from skilled therapeutic intervention in order to improve the following deficits and impairments:  Decreased activity tolerance, Decreased strength, Impaired  flexibility, Postural dysfunction, Pain, Improper body  mechanics, Decreased range of motion, Hypomobility, Increased muscle spasms  Visit Diagnosis: Chronic left-sided low back pain, unspecified whether sciatica present  Muscle spasm of back  Abnormal posture     Problem List Patient Active Problem List   Diagnosis Date Noted  . Morbid obesity ([place]) 08/05/2017  . OSA (obstructive sleep apnea) 04/05/2017  . S/P right knee arthroscopy 03/29/2014    Sigurd Sos, PT 04/25/18 9:02 AM  [facility] [address], [place], [place], [address] Phone: [phone]   Fax:  [phone]  Name: ORMOND LAZO MRN: [id number] Date of Birth: [birth year]

## 2018-04-25 NOTE — Patient Instructions (Signed)
Access Code: [email or web address]  URL: [email or web address]  Date: 04/25/2018  Prepared by: Sigurd Sos   Exercises    Seated Shoulder Horizontal Abduction with Resistance - 10 reps - 2 sets - 2x daily - 7x weekly  Seated Shoulder W External Rotation on Swiss Ball - 10 reps - 2 sets - 2x daily - 7x weekly

## 2018-04-27 ENCOUNTER — Encounter: Payer: 59 | Admitting: Physical Therapy

## 2018-04-30 ENCOUNTER — Ambulatory Visit (HOSPITAL_COMMUNITY)
Admission: RE | Admit: 2018-04-30 | Discharge: 2018-04-30 | Disposition: A | Discharge status: Home / Self Care | Payer: 59 | Source: Ambulatory Visit | Attending: Specialist | Admitting: Specialist

## 2018-04-30 DIAGNOSIS — M48061 Spinal stenosis, lumbar region without neurogenic claudication: Secondary | ICD-10-CM | POA: Insufficient documentation

## 2018-04-30 DIAGNOSIS — M545 Low back pain, unspecified: Secondary | ICD-10-CM

## 2018-04-30 DIAGNOSIS — M5127 Other intervertebral disc displacement, lumbosacral region: Secondary | ICD-10-CM | POA: Insufficient documentation

## 2018-04-30 DIAGNOSIS — M5136 Other intervertebral disc degeneration, lumbar region: Secondary | ICD-10-CM | POA: Insufficient documentation

## 2018-05-01 DIAGNOSIS — M1711 Unilateral primary osteoarthritis, right knee: Secondary | ICD-10-CM | POA: Diagnosis not present

## 2018-05-02 ENCOUNTER — Encounter: Payer: Self-pay | Admitting: Physical Therapy

## 2018-05-02 ENCOUNTER — Ambulatory Visit: Payer: 59 | Admitting: Physical Therapy

## 2018-05-02 DIAGNOSIS — G8929 Other chronic pain: Secondary | ICD-10-CM | POA: Diagnosis not present

## 2018-05-02 DIAGNOSIS — M62830 Muscle spasm of back: Secondary | ICD-10-CM

## 2018-05-02 DIAGNOSIS — R293 Abnormal posture: Secondary | ICD-10-CM | POA: Diagnosis not present

## 2018-05-02 DIAGNOSIS — M545 Low back pain, unspecified: Secondary | ICD-10-CM

## 2018-05-02 NOTE — Therapy (Signed)
[facility] [address] [place], [place], [address] Phone: [phone]   Fax:  [phone]  Physical Therapy Treatment  Patient Details  Name: David Newman MRN: [id number] Date of Birth: 01/05/1961 Referring Provider (PT): Gerrit Halls, PA-C   Encounter Date: 05/02/2018  PT End of Session - 05/02/18 0940    Visit Number  7    Date for PT Re-Evaluation  05/26/18    Authorization Type  [employer] [employer]    PT Start Time  0800    PT Stop Time  0843    PT Time Calculation (min)  43 min    Activity Tolerance  Patient tolerated treatment well;No increased pain    Behavior During Therapy  WFL for tasks assessed/performed       Past Medical History:  Diagnosis Date  . Anal fissure   . At risk for sleep apnea    STOP-BANG= 5    SENT TO PCP 03-27-2014  . CAD (coronary artery disease)   . Colon polyps    hyperplastic  . GERD (gastroesophageal reflux disease)   . Hyperlipidemia   . OA (osteoarthritis) of knee   . Right knee meniscal tear   . 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 Wears contact lenses   . Wears glasses     Past Surgical History:  Procedure Laterality Date  . COLONOSCOPY  04-24-2012  . FACIAL RECONSTRUCTION SURGERY  1973  age 89  . KNEE ARTHROSCOPY Right 03/29/2014   Procedure: RIGHT ARTHROSCOPY KNEE WITH DEBRIDMENT PARTIAL medial lateral MENISCECTOMY AND CHONDROPLASTY;  Surgeon: Sydnee Cabal, MD;  Location: [place];  Service: Orthopedics;  Laterality: Right;  . KNEE ARTHROSCOPY WITH ANTERIOR CRUCIATE LIGAMENT (ACL) REPAIR Right 1994  . WISDOM TOOTH EXTRACTION  age 20    There were no vitals filed for this visit.  Subjective Assessment - 05/02/18 0801    Subjective  Pt states that he had an MRI on Sunday which he had someone read off the results. He says there was alot of stenosis and disc bulging that he has not confirmed yet with Dr. Maxie Better. He drove back and forth to  [facility] yesterday and had some soreness during and following this. He did try using the lumbar roll but is not sure if he is puting it in the correct spot. He feels that his leg is more weak on the Lt but he is not sure if this was just a coincidence.     Pertinent History  Rt knee scope and ACL repair    Patient Stated Goals  decrease pain     Currently in Pain?  Yes    Pain Score  2     Pain Location  Back    Pain Orientation  Left    Pain Descriptors / Indicators  Dull    Pain Type  Chronic pain    Pain Radiating Towards  none     Pain Onset  More than a month ago    Pain Frequency  Intermittent    Aggravating Factors   yardwork, lifting    Pain Relieving Factors  stretching, medication, change in position    Effect of Pain on Daily Activities  limited participation in [place] Adult PT Treatment/Exercise - 05/02/18 0001      Self-Care   Self-Care  Posture  Posture  use of lumbar roll       Lumbar Exercises: Standing   Other Standing Lumbar Exercises  repeated extension x10 reps     Other Standing Lumbar Exercises  BUE pressdown with green TB x15      Lumbar Exercises: Seated   Other Seated Lumbar Exercises  self traction on pull down machine 10x10 reps       Lumbar Exercises: Supine   Dead Bug  10 reps    Dead Bug Limitations  verbal cues for abdominal activation       Lumbar Exercises: Quadruped   Straight Leg Raise  10 reps    Straight Leg Raises Limitations  tactile cues to decrease trunk shift    Other Quadruped Lumbar Exercises  threading the needle x10 reps each direction      Knee/Hip Exercises: Sidelying   Clams  abdominal activation with blue TB x10 reps each side              PT Education - 05/02/18 0846    Education Details  updated HEP; general pathology of spinal stenosis    Person(s) Educated  Patient    Methods  Explanation;Verbal cues;Demonstration    Comprehension  Verbalized understanding;Returned  demonstration       PT Short Term Goals - 04/20/18 1719      PT SHORT TERM GOAL #1   Title  Pt will demo consistency and independence with his initial HEP to improve flexibility and posture awareness.     Status  Achieved      PT SHORT TERM GOAL #2   Title  Pt will demo good undestanding of lifting mechanics, evident by his ability to lift 10lb box from the floor atleast 5x without the need for therapist cuing.     Status  Achieved        PT Long Term Goals - 03/28/18 1125      PT LONG TERM GOAL #1   Title  Pt will be independent with his advanced HEP to allow for continued progress and maintenance of his progress after d/c from PT.     Time  8    Period  Weeks    Status  New    Target Date  05/26/18      PT LONG TERM GOAL #2   Title  Pt will report atleast 70% improvement in his low back pain from the start of PT which will allow him to resume his prior recreational activity without limitation.     Time  8    Period  Weeks    Status  New      PT LONG TERM GOAL #3   Title  Pt will have atleast 40 deg of hip IR bilaterally which will assist with his mechanics during golf.    Time  8    Period  Weeks    Status  New      PT LONG TERM GOAL #4   Title  Pt will be able to complete active trunk rotation Lt and Rt without increase in pain, to reflect improvement in thoracic, hip and lumbar mobility.     Time  8    Period  Weeks    Status  New            Plan - 05/02/18 0941    Clinical Impression Statement  Pt reports some improvements in muscle pain following needling last session. Overall, he denies recent increase in LLE symptoms, noting dull ache  in the Lt low back only. Pt had questions regarding types of spinal stenosis and therapist was able to educate him on this. Also made some adjustments to lumbar roll support which he noted made an improvement in his back pain while seated. Pt has signs of trunk muscle weakness and poor activation, noted during supine and  quadruped therex. Tactile cues from therapist did improve his technique and he was encouraged to continue working on this at home.     Rehab Potential  Good    PT Frequency  2x / week    PT Duration  8 weeks    PT Treatment/Interventions  ADLs/Self Care Home Management;Moist Heat;Electrical Stimulation;Cryotherapy;Therapeutic exercise;Therapeutic activities;Neuromuscular re-education;Patient/family education;Manual techniques;Passive range of motion;Dry needling    PT Next Visit Plan  repeat d/n; dead bug and quadruped strength    PT Home Exercise Plan  Access Code: [email or web address]     Consulted and Agree with Plan of Care  Patient       Patient will benefit from skilled therapeutic intervention in order to improve the following deficits and impairments:  Decreased activity tolerance, Decreased strength, Impaired flexibility, Postural dysfunction, Pain, Improper body mechanics, Decreased range of motion, Hypomobility, Increased muscle spasms  Visit Diagnosis: Chronic left-sided low back pain, unspecified whether sciatica present  Muscle spasm of back  Abnormal posture     Problem List Patient Active Problem List   Diagnosis Date Noted  . Morbid obesity ([place]) 08/05/2017  . OSA (obstructive sleep apnea) 04/05/2017  . S/P right knee arthroscopy 03/29/2014    9:57 AM,05/02/18 Sherol Dade PT, DPT [facility] [address] [place], [place], [address] Phone: [phone]   Fax:  [phone]  Name: CASON LUFFMAN MRN: [id number] Date of Birth: 1960/07/15

## 2018-05-04 ENCOUNTER — Other Ambulatory Visit: Payer: Self-pay | Admitting: Internal Medicine

## 2018-05-04 ENCOUNTER — Ambulatory Visit: Payer: 59 | Admitting: Physical Therapy

## 2018-05-04 DIAGNOSIS — G8929 Other chronic pain: Secondary | ICD-10-CM

## 2018-05-04 DIAGNOSIS — M545 Low back pain, unspecified: Secondary | ICD-10-CM

## 2018-05-04 DIAGNOSIS — M62830 Muscle spasm of back: Secondary | ICD-10-CM | POA: Diagnosis not present

## 2018-05-04 DIAGNOSIS — K51311 Ulcerative (chronic) rectosigmoiditis with rectal bleeding: Secondary | ICD-10-CM

## 2018-05-04 DIAGNOSIS — R293 Abnormal posture: Secondary | ICD-10-CM

## 2018-05-04 NOTE — Therapy (Signed)
[facility] [address] [place], [place], [address] Phone: [phone]   Fax:  [phone]  Physical Therapy Treatment  Patient Details  Name: David Newman MRN: [id number] Date of Birth: 12/31/1960 Referring Provider (PT): Gerrit Halls, PA-C   Encounter Date: 05/04/2018  PT End of Session - 05/04/18 0932    Visit Number  8    Date for PT Re-Evaluation  05/26/18    Authorization Type  [place] [employer]    PT Start Time  0800    PT Stop Time  0845    PT Time Calculation (min)  45 min    Activity Tolerance  Patient tolerated treatment well;No increased pain    Behavior During Therapy  WFL for tasks assessed/performed       Past Medical History:  Diagnosis Date  . Anal fissure   . At risk for sleep apnea    STOP-BANG= 5    SENT TO PCP 03-27-2014  . CAD (coronary artery disease)   . Colon polyps    hyperplastic  . GERD (gastroesophageal reflux disease)   . Hyperlipidemia   . OA (osteoarthritis) of knee   . Right knee meniscal tear   . 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 Wears contact lenses   . Wears glasses     Past Surgical History:  Procedure Laterality Date  . COLONOSCOPY  04-24-2012  . FACIAL RECONSTRUCTION SURGERY  1973  age 57  . KNEE ARTHROSCOPY Right 03/29/2014   Procedure: RIGHT ARTHROSCOPY KNEE WITH DEBRIDMENT PARTIAL medial lateral MENISCECTOMY AND CHONDROPLASTY;  Surgeon: Sydnee Cabal, MD;  Location: [place];  Service: Orthopedics;  Laterality: Right;  . KNEE ARTHROSCOPY WITH ANTERIOR CRUCIATE LIGAMENT (ACL) REPAIR Right 1994  . WISDOM TOOTH EXTRACTION  age 57    There were no vitals filed for this visit.  Subjective Assessment - 05/04/18 0803    Subjective  Pt states that things are going well. He is planning to go fishing next week and is excited about this.     Pertinent History  Rt knee scope and ACL repair    Patient Stated Goals  decrease pain     Currently in Pain?  No/denies    Pain Onset  More than a month ago                       [facility] Adult PT Treatment/Exercise - 05/04/18 0001      Lumbar Exercises: Seated   Hip Flexion on Ball  Right;Left;15 reps    Hip Flexion on Ball Limitations  seated on solid surface and unstable surface    Other Seated Lumbar Exercises  pelvic tilt x15 reps ant/post      Lumbar Exercises: Supine   Dead Bug  10 reps;3 seconds    Other Supine Lumbar Exercises  bent knee 90/90 hold with LE extension x8 reps each      Manual Therapy   Manual Therapy  Myofascial release    Joint Mobilization  L4 to T10 CPAs grade III-IV x3 bouts     Myofascial Release  Trigger point release Lt QL, Lumbar paraspinals        Trigger Point Dry Needling - 05/04/18 0826    Consent Given?  Yes    Education Handout Provided  Yes    Muscles Treated Lower Body  --   Lt lumbar multifidi L4 to S1, Lt QL  PT Education - 05/04/18 0931    Education Details  technique with therex    Person(s) Educated  Patient    Methods  Explanation;Verbal cues    Comprehension  Verbalized understanding;Returned demonstration       PT Short Term Goals - 05/04/18 0936      PT SHORT TERM GOAL #1   Title  Pt will demo consistency and independence with his initial HEP to improve flexibility and posture awareness.     Status  Achieved      PT SHORT TERM GOAL #2   Title  Pt will demo good undestanding of lifting mechanics, evident by his ability to lift 10lb box from the floor atleast 5x without the need for therapist cuing.     Status  Achieved        PT Long Term Goals - 03/28/18 1125      PT LONG TERM GOAL #1   Title  Pt will be independent with his advanced HEP to allow for continued progress and maintenance of his progress after d/c from PT.     Time  8    Period  Weeks    Status  New    Target Date  05/26/18      PT LONG TERM GOAL #2   Title  Pt will report atleast 70% improvement in his low  back pain from the start of PT which will allow him to resume his prior recreational activity without limitation.     Time  8    Period  Weeks    Status  New      PT LONG TERM GOAL #3   Title  Pt will have atleast 40 deg of hip IR bilaterally which will assist with his mechanics during golf.    Time  8    Period  Weeks    Status  New      PT LONG TERM GOAL #4   Title  Pt will be able to complete active trunk rotation Lt and Rt without increase in pain, to reflect improvement in thoracic, hip and lumbar mobility.     Time  8    Period  Weeks    Status  New            Plan - 05/04/18 0932    Clinical Impression Statement  Pt arrived with 0/10 pain report this visit. Session focused on trunk strength progressions, with noted difficulty maintaining neutral spine during seated hip flexion activity. Pt feels better overall, and is planning to go on a fishing trip next week. He was able to progress to seated trunk strengthening this session with some cues needed to decrease lumbar flexion during active hip flexion. Ended with dry needling to the lumbar spine, and there were several twitch responses noted in the Lt quadratus lumborum specifically. Ended without increase in low back pain and pt had good understanding of HEP for completion over the next 2 weeks.     Rehab Potential  Good    PT Frequency  2x / week    PT Duration  8 weeks    PT Treatment/Interventions  ADLs/Self Care Home Management;Moist Heat;Electrical Stimulation;Cryotherapy;Therapeutic exercise;Therapeutic activities;Neuromuscular re-education;Patient/family education;Manual techniques;Passive range of motion;Dry needling    PT Next Visit Plan  f/u on DN and complete again to QL if needed; seated and quadruped lumbar stability; possible lumbar traction     PT Home Exercise Plan  Access Code: [email or web address]     Consulted and Agree with Plan  of Care  Patient       Patient will benefit from skilled therapeutic intervention in  order to improve the following deficits and impairments:  Decreased activity tolerance, Decreased strength, Impaired flexibility, Postural dysfunction, Pain, Improper body mechanics, Decreased range of motion, Hypomobility, Increased muscle spasms  Visit Diagnosis: Chronic left-sided low back pain, unspecified whether sciatica present  Muscle spasm of back  Abnormal posture     Problem List Patient Active Problem List   Diagnosis Date Noted  . Morbid obesity ([place]) 08/05/2017  . OSA (obstructive sleep apnea) 04/05/2017  . S/P right knee arthroscopy 03/29/2014    9:37 AM,05/04/18 Sherol Dade PT, DPT [facility] [address], [place], [place], [address] Phone: [phone]   Fax:  [phone]  Name: TYVON EGGENBERGER MRN: [id number] Date of Birth: 1960/12/20

## 2018-05-09 DIAGNOSIS — E882 Lipomatosis, not elsewhere classified: Secondary | ICD-10-CM | POA: Diagnosis not present

## 2018-05-09 DIAGNOSIS — M5136 Other intervertebral disc degeneration, lumbar region: Secondary | ICD-10-CM | POA: Diagnosis not present

## 2018-05-09 DIAGNOSIS — Z6841 Body Mass Index (BMI) 40.0 and over, adult: Secondary | ICD-10-CM | POA: Diagnosis not present

## 2018-05-09 DIAGNOSIS — M999 Biomechanical lesion, unspecified: Secondary | ICD-10-CM | POA: Diagnosis not present

## 2018-05-23 ENCOUNTER — Ambulatory Visit: Payer: 59 | Attending: Physician Assistant

## 2018-05-23 DIAGNOSIS — M545 Low back pain, unspecified: Secondary | ICD-10-CM

## 2018-05-23 DIAGNOSIS — R293 Abnormal posture: Secondary | ICD-10-CM | POA: Diagnosis not present

## 2018-05-23 DIAGNOSIS — G8929 Other chronic pain: Secondary | ICD-10-CM | POA: Insufficient documentation

## 2018-05-23 DIAGNOSIS — M62830 Muscle spasm of back: Secondary | ICD-10-CM | POA: Insufficient documentation

## 2018-05-23 NOTE — Therapy (Signed)
[facility] [address] [place], [place], [address] Phone: [phone]   Fax:  [phone]  Physical Therapy Treatment  Patient Details  Name: David Newman MRN: [id number] Date of Birth: 25-Jan-1961 Referring Provider (PT): Gerrit Halls, PA-C   Encounter Date: 05/23/2018  PT End of Session - 05/23/18 0803    Visit Number  9    Authorization Type  [place] [employer]    PT Start Time  0730    PT Stop Time  0802    PT Time Calculation (min)  32 min    Activity Tolerance  Patient tolerated treatment well;No increased pain    Behavior During Therapy  WFL for tasks assessed/performed       Past Medical History:  Diagnosis Date  . Anal fissure   . At risk for sleep apnea    STOP-BANG= 5    SENT TO PCP 03-27-2014  . CAD (coronary artery disease)   . Colon polyps    hyperplastic  . GERD (gastroesophageal reflux disease)   . Hyperlipidemia   . OA (osteoarthritis) of knee   . Right knee meniscal tear   . 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 Wears contact lenses   . Wears glasses     Past Surgical History:  Procedure Laterality Date  . COLONOSCOPY  04-24-2012  . FACIAL RECONSTRUCTION SURGERY  1973  age 10  . KNEE ARTHROSCOPY Right 03/29/2014   Procedure: RIGHT ARTHROSCOPY KNEE WITH DEBRIDMENT PARTIAL medial lateral MENISCECTOMY AND CHONDROPLASTY;  Surgeon: Sydnee Cabal, MD;  Location: [facility];  Service: Orthopedics;  Laterality: Right;  . KNEE ARTHROSCOPY WITH ANTERIOR CRUCIATE LIGAMENT (ACL) REPAIR Right 1994  . WISDOM TOOTH EXTRACTION  age 27    There were no vitals filed for this visit.  Subjective Assessment - 05/23/18 0734    Subjective  I didn't sleep well last night.      Pertinent History  Rt knee scope and ACL repair    Diagnostic tests  degenerative changes in lumbar spine (L5/S1)    Currently in Pain?  Yes    Pain Score  3     Pain Location  Back    Pain Orientation   Left    Pain Descriptors / Indicators  Dull    Pain Type  Chronic pain    Pain Onset  More than a month ago    Pain Frequency  Intermittent    Aggravating Factors   yardwork, lifting    Pain Relieving Factors  stretching, medication, change of position         [facility] PT Assessment - 05/23/18 0001      Assessment   Medical Diagnosis  Lumbar DDD    Referring Provider (PT)  Gerrit Halls, PA-C      Observation/Other Assessments   Focus on Therapeutic Outcomes (FOTO)   47% limitation      Palpation   Palpation comment  muscle tenderenss along Lt paraspinals along L5 region primarily                            PT Education - 05/23/18 0803    Education Details  how to progress HEP, McKenzie exercises, stenosis anatomy    Person(s) Educated  Patient    Methods  Explanation    Comprehension  Verbalized understanding       PT Short Term Goals - 05/04/18 0936      PT  SHORT TERM GOAL #1   Title  Pt will demo consistency and independence with his initial HEP to improve flexibility and posture awareness.     Status  Achieved      PT SHORT TERM GOAL #2   Title  Pt will demo good undestanding of lifting mechanics, evident by his ability to lift 10lb box from the floor atleast 5x without the need for therapist cuing.     Status  Achieved        PT Long Term Goals - 05/23/18 0754      PT LONG TERM GOAL #1   Title  Pt will be independent with his advanced HEP to allow for continued progress and maintenance of his progress after d/c from PT.     Baseline  will add McKenzie to this program    Status  Achieved      PT LONG TERM GOAL #2   Title  Pt will report atleast 70% improvement in his low back pain from the start of PT which will allow him to resume his prior recreational activity without limitation.     Baseline  20% limitation    Status  Partially Met      PT LONG TERM GOAL #3   Title  Pt will have atleast 40 deg of hip IR bilaterally which will assist  with his mechanics during golf.    Status  Not Met      PT LONG TERM GOAL #4   Title  Pt will be able to complete active trunk rotation Lt and Rt without increase in pain, to reflect improvement in thoracic, hip and lumbar mobility.             Plan - 05/23/18 0803    Clinical Impression Statement  Pt is ready for D/C to HEP and will continue with therapy exercises and begin McKenzie exercises.  FOTO is unchanged although pt reports 20% overall improvement since the start of care.  Session spent today providing education regarding progression of exercise, weight loss and diagnosis.  Pt will D/C to HEP today.      PT Next Visit Plan  D/C PT to HEP    PT Home Exercise Plan  Access Code: [email or web address]     Consulted and Agree with Plan of Care  Patient       Patient will benefit from skilled therapeutic intervention in order to improve the following deficits and impairments:     Visit Diagnosis: Chronic left-sided low back pain, unspecified whether sciatica present  Muscle spasm of back  Abnormal posture     Problem List Patient Active Problem List   Diagnosis Date Noted  . Morbid obesity ([place]) 08/05/2017  . OSA (obstructive sleep apnea) 04/05/2017  . S/P right knee arthroscopy 03/29/2014   PHYSICAL THERAPY DISCHARGE SUMMARY  Visits from Start of Care: 9  Current functional level related to goals / functional outcomes: See above for current status.  Pt will D/C to HEP and follow-up with MD as needed.   Remaining deficits: LBP and Lt LE weakness due to chronic condition   Education / Equipment: HEP, posture/body mechanics Plan: Patient agrees to discharge.  Patient goals were partially met. Patient is being discharged due to being pleased with the current functional level.  ?????     Sigurd Sos, PT 05/23/18 8:04 AM  [facility] [address], [place], [place], [address] Phone: [phone]   Fax:   [phone]  Name:  David Newman MRN: [id number] Date of Birth: June 23, 1960

## 2018-05-25 ENCOUNTER — Ambulatory Visit: Payer: 59

## 2018-06-23 DIAGNOSIS — H52201 Unspecified astigmatism, right eye: Secondary | ICD-10-CM | POA: Diagnosis not present

## 2018-06-23 DIAGNOSIS — H5212 Myopia, left eye: Secondary | ICD-10-CM | POA: Diagnosis not present

## 2018-06-23 DIAGNOSIS — H5211 Myopia, right eye: Secondary | ICD-10-CM | POA: Diagnosis not present

## 2018-06-23 DIAGNOSIS — H524 Presbyopia: Secondary | ICD-10-CM | POA: Diagnosis not present

## 2018-06-26 ENCOUNTER — Other Ambulatory Visit: Payer: Self-pay | Admitting: Internal Medicine

## 2018-06-26 DIAGNOSIS — K51311 Ulcerative (chronic) rectosigmoiditis with rectal bleeding: Secondary | ICD-10-CM

## 2018-07-01 DIAGNOSIS — M25362 Other instability, left knee: Secondary | ICD-10-CM | POA: Diagnosis not present

## 2018-07-01 DIAGNOSIS — M25562 Pain in left knee: Secondary | ICD-10-CM | POA: Insufficient documentation

## 2018-07-03 ENCOUNTER — Other Ambulatory Visit: Payer: Self-pay | Admitting: Sports Medicine

## 2018-07-03 DIAGNOSIS — M25562 Pain in left knee: Secondary | ICD-10-CM

## 2018-07-06 ENCOUNTER — Ambulatory Visit (HOSPITAL_COMMUNITY)
Admission: RE | Admit: 2018-07-06 | Discharge: 2018-07-06 | Disposition: A | Discharge status: Home / Self Care | Payer: 59 | Source: Ambulatory Visit | Attending: Sports Medicine | Admitting: Sports Medicine

## 2018-07-06 DIAGNOSIS — M25562 Pain in left knee: Secondary | ICD-10-CM | POA: Diagnosis not present

## 2018-07-06 DIAGNOSIS — S8992XA Unspecified injury of left lower leg, initial encounter: Secondary | ICD-10-CM | POA: Diagnosis not present

## 2018-07-17 DIAGNOSIS — S83522A Sprain of posterior cruciate ligament of left knee, initial encounter: Secondary | ICD-10-CM | POA: Diagnosis not present

## 2018-07-17 DIAGNOSIS — S83242A Other tear of medial meniscus, current injury, left knee, initial encounter: Secondary | ICD-10-CM | POA: Diagnosis not present

## 2018-08-03 ENCOUNTER — Telehealth: Payer: Self-pay | Admitting: Internal Medicine

## 2018-08-03 NOTE — Telephone Encounter (Signed)
Pt reports he has been having a little flare for the past few weeks. Pt had some old prednisone and started taking it and got some better but is still seeing some blood in his stool. Discussed with him it does not sound as this is a routine visit. Pt may send a mychart message later but at this point plans to keep his OV as scheduled.

## 2018-08-03 NOTE — Telephone Encounter (Signed)
Pt wants to speak with the nurse before canceling the appt that he has on 08/07/2018

## 2018-08-04 ENCOUNTER — Telehealth: Payer: Self-pay

## 2018-08-04 NOTE — Telephone Encounter (Signed)

## 2018-08-06 ENCOUNTER — Telehealth: Payer: Self-pay

## 2018-08-06 NOTE — Telephone Encounter (Signed)
Pt would like to have telephone visit tomorrow due to covid-19, let him know Dr. Henrene Pastor will have phone visit with pt.

## 2018-08-07 ENCOUNTER — Telehealth (INDEPENDENT_AMBULATORY_CARE_PROVIDER_SITE_OTHER): Payer: 59 | Admitting: Internal Medicine

## 2018-08-07 ENCOUNTER — Other Ambulatory Visit: Payer: Self-pay

## 2018-08-07 DIAGNOSIS — K51311 Ulcerative (chronic) rectosigmoiditis with rectal bleeding: Secondary | ICD-10-CM | POA: Diagnosis not present

## 2018-08-07 MED ORDER — MESALAMINE 4 G RE ENEM: ENEMA | RECTAL | 11 refills | Status: DC

## 2018-08-07 MED ORDER — PREDNISONE 20 MG PO TABS: ORAL_TABLET | ORAL | 0 refills | Status: DC

## 2018-08-07 MED ORDER — MESALAMINE 1.2 G PO TBEC: 4.8000 g | DELAYED_RELEASE_TABLET | Freq: Every day | ORAL | 3 refills | Status: DC

## 2018-08-07 NOTE — Progress Notes (Signed)
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  He was found to have ulcerative rectosigmoiditis to 25 cm on colonoscopy May 15, 2015.  Last evaluated in this office June 23, 2017 at which time he was in clinical remission on Lialda 4.8 g daily.  Blood work at that time was unremarkable.  1 year follow-up recommended.  Patient tells me that he was well until approximately 3 months ago when he went from his normal once in the morning solid bowel movement to more frequent loose bowel movements with urgency, mucus, and bleeding.  He had been compliant with Lialda without change.  About 3 months ago he added at night mesalamine enemas with slight improvement.  He self medicated with prednisone 20 mg for approximately 15 days.  He continues with symptoms.  No fever.  No nocturnal symptoms.  Occasional transient abdominal discomfort relieved with defecation.  He has questions regarding treatment options, risk of viral infection due to immunosuppression, and the need for follow-up colonoscopy.  We discussed all of these issues in detail.  Impression 1.  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.  Flare x3 months  Recommendations 1.  Refill Lialda 4.8 g daily for 1 year 2.  Refill mesalamine enemas for 1 year 3.  Prescribed prednisone 20 mg; #60; no refills.  The patient has been instructed to initiate prednisone 40 mg daily for 2 weeks then 30 mg daily for 2 weeks then 20 mg daily 4.  In person office follow-up in 4 weeks 5.  Contact the office for interval questions or problems 6.  CBC, comprehensive metabolic panel, C-reactive protein today (or his nearest convenience) 25 minutes spent via telephone communication and coordination of care I have sent this correspondence to Julieanne Cotton CMA for execution of the orders.

## 2018-08-07 NOTE — Addendum Note (Signed)
Addended by: Audrea Muscat on: 08/07/2018 04:53 PM   Modules accepted: Orders

## 2018-08-07 NOTE — Progress Notes (Signed)
Printed AVS with Dr. Blanch Media instructions.  Refilled Lialda, Mesalamine Enemas, and sent rx for Prednisone.  Put in orders for CBC, CMET, and CRP.  Scheduled 4 week follow up.  Mailed AVS to patient

## 2018-08-07 NOTE — Patient Instructions (Addendum)
1.  Refill Lialda 4.8 g daily for 1 year 2.  Refill mesalamine enemas for 1 year 3.  Prescribed prednisone 20 mg; #60; no refills.  The patient has been instructed to initiate prednisone 40 mg daily for 2 weeks then 30 mg daily for 2 weeks then 20 mg daily 4.  Please follow up with Dr. Henrene Pastor on 09/14/2018 at 1:30pm.  If anything changes regarding this, we will let you know.  5.  Contact the office for interval questions or problems 6.  CBC, comprehensive metabolic panel, C-reactive protein today (or his nearest convenience)

## 2018-08-15 DIAGNOSIS — S83242D Other tear of medial meniscus, current injury, left knee, subsequent encounter: Secondary | ICD-10-CM | POA: Diagnosis not present

## 2018-08-30 ENCOUNTER — Telehealth: Payer: Self-pay | Admitting: Internal Medicine

## 2018-08-30 NOTE — Telephone Encounter (Signed)
Labs when he can would be great. My note specifies what labs

## 2018-08-30 NOTE — Telephone Encounter (Signed)
I called the patient to change his apt to a virtual visit on 4-30. He said that at his last visit with Dr. Henrene Pastor he was told to have some labs done. He also said he has not yet had them done since he is exposed to [place] since he works with [employer]. He would like to know if he has to have them done before his apt or if he should he hold off.

## 2018-08-30 NOTE — Telephone Encounter (Signed)
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

## 2018-08-31 NOTE — Telephone Encounter (Signed)
Spoke with patient and patient will come in when he can for labs.

## 2018-09-14 ENCOUNTER — Encounter: Payer: Self-pay | Admitting: Internal Medicine

## 2018-09-14 ENCOUNTER — Other Ambulatory Visit: Payer: Self-pay

## 2018-09-14 ENCOUNTER — Ambulatory Visit: Payer: 59 | Admitting: Internal Medicine

## 2018-09-14 ENCOUNTER — Ambulatory Visit (INDEPENDENT_AMBULATORY_CARE_PROVIDER_SITE_OTHER): Payer: 59 | Admitting: Internal Medicine

## 2018-09-14 VITALS — Ht 71.25 in | Wt 241.0 lb

## 2018-09-14 DIAGNOSIS — K51311 Ulcerative (chronic) rectosigmoiditis with rectal bleeding: Secondary | ICD-10-CM

## 2018-09-14 MED ORDER — PREDNISONE 10 MG PO TABS: 10.0000 mg | ORAL_TABLET | ORAL | 0 refills | Status: DC

## 2018-09-14 NOTE — Addendum Note (Signed)
Addended by: Wyline Beady on: 09/14/2018 11:43 AM   Modules accepted: Orders

## 2018-09-14 NOTE — Patient Instructions (Signed)
1.  Continue to taper prednisone.  After completing 2 weeks of 20 mg then 10 mg for 2 weeks, then stop. 2.  PRESCRIBE PREDNISONE 20 mg daily or as directed by your provider; #60; no refills; 3.  OFFICE FOLLOW-up 6 weeks.  Contact the office in the interim for questions or problems This was a WebEx audiovisual encounter initiated by the patient and consented for by the patient.  He was in his office and I was in my office during the encounter.  He understands her may be an associated professional charge for this service.

## 2018-09-14 NOTE — Progress Notes (Signed)
HISTORY OF PRESENT ILLNESS:  David Newman is a 58 y.o. male who was initially evaluated December 2016 with urgency, rectal bleeding, and mucus.  He was found to have ulcerative proctosigmoiditis to 25 cm on colonoscopy May 15, 2015.  I last saw the patient via telemedicine August 07, 2018.  See that dictation.  At that time he was experiencing a 7-monthflareup.  His Lialda was refilled and resumed at 4.8 g daily, mesalamine enemas at night, and prednisone initiated at 40 mg daily.  Blood work was ordered but not performed.  He has tapered his prednisone by 10 mg every 2 weeks as instructed.  Currently on 20 mg for the past week.  He presents now for follow-up.  He is pleased to report that he is markedly better.  Currently experiencing 1 formed bowel movement daily without urgency.  He has noticed some blood this week but no other symptoms.  He questions the role of diet and stress with regards to his colitis.  REVIEW OF SYSTEMS:  All non-GI ROS negative unless otherwise stated in the HPI except for anxiety  Past Medical History:  Diagnosis Date  . Anal fissure   . At risk for sleep apnea    STOP-BANG= 5    SENT TO PCP 03-27-2014  . CAD (coronary artery disease)   . Colon polyps    hyperplastic  . GERD (gastroesophageal reflux disease)   . Hyperlipidemia   . OA (osteoarthritis) of knee   . Right knee meniscal tear   . 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[place]   . Wears contact lenses   . Wears glasses     Past Surgical History:  Procedure Laterality Date  . COLONOSCOPY  04-24-2012  . FACIAL RECONSTRUCTION SURGERY  1973  age 58 . KNEE ARTHROSCOPY Right 03/29/2014   Procedure: RIGHT ARTHROSCOPY KNEE WITH DEBRIDMENT PARTIAL medial lateral MENISCECTOMY AND CHONDROPLASTY;  Surgeon: RSydnee Cabal MD;  Location[place] [facility]  Service: Orthopedics;  Laterality: Right;  . KNEE ARTHROSCOPY WITH ANTERIOR CRUCIATE LIGAMENT (ACL) REPAIR Right 1994  . WISDOM TOOTH EXTRACTION   age 58   Social History MJAYDIEN Newman reports that he quit smoking about 11 years ago. His smoking use included cigarettes. He has a 35.00 pack-year smoking history. He has never used smokeless tobacco. He reports current alcohol use. He reports that he does not use drugs.  family history includes Alzheimer's disease in his mother; Colon cancer (age of onset: 556 in his cousin; Colon cancer (age of onset: 552 in his paternal uncle; Colon polyps (age of onset: 565 in his brother; Heart disease in his brother and father; Skin cancer in his father.  Allergies  Allergen Reactions  . Sulfa Antibiotics Nausea And Vomiting       PHYSICAL EXAMINATION: No physical exam with telemedicine visit.  However, the patient looked well, was alert and oriented, and cooperative    ASSESSMENT:  1.  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.  Recent flare.  Improved on medical therapy   PLAN:  1.  Continue to taper prednisone.  After completing 2 weeks of 20 mg then 10 mg for 2 weeks, then stop. 2.  PRESCRIBE PREDNISONE 20 mg daily or as directed by your provider; #60; no refills; 3.  OFFICE FOLLOW-up 6 weeks.  Contact the office in the interim for questions or problems This was a WebEx audiovisual encounter initiated by the patient and consented for by the patient.  He was in his office and I was in my  office during the encounter.  He understands her may be an associated professional charge for this service.

## 2018-11-13 DIAGNOSIS — S83242D Other tear of medial meniscus, current injury, left knee, subsequent encounter: Secondary | ICD-10-CM | POA: Diagnosis not present

## 2018-11-13 DIAGNOSIS — S83521D Sprain of posterior cruciate ligament of right knee, subsequent encounter: Secondary | ICD-10-CM | POA: Diagnosis not present

## 2018-11-24 DIAGNOSIS — R3121 Asymptomatic microscopic hematuria: Secondary | ICD-10-CM | POA: Diagnosis not present

## 2018-11-24 DIAGNOSIS — R361 Hematospermia: Secondary | ICD-10-CM | POA: Diagnosis not present

## 2018-12-05 DIAGNOSIS — Z125 Encounter for screening for malignant neoplasm of prostate: Secondary | ICD-10-CM | POA: Diagnosis not present

## 2018-12-05 DIAGNOSIS — R739 Hyperglycemia, unspecified: Secondary | ICD-10-CM | POA: Diagnosis not present

## 2018-12-05 DIAGNOSIS — E7800 Pure hypercholesterolemia, unspecified: Secondary | ICD-10-CM | POA: Diagnosis not present

## 2018-12-14 DIAGNOSIS — F5102 Adjustment insomnia: Secondary | ICD-10-CM | POA: Diagnosis not present

## 2018-12-14 DIAGNOSIS — R739 Hyperglycemia, unspecified: Secondary | ICD-10-CM | POA: Diagnosis not present

## 2018-12-14 DIAGNOSIS — E7800 Pure hypercholesterolemia, unspecified: Secondary | ICD-10-CM | POA: Diagnosis not present

## 2018-12-14 DIAGNOSIS — H6123 Impacted cerumen, bilateral: Secondary | ICD-10-CM | POA: Diagnosis not present

## 2018-12-25 ENCOUNTER — Encounter (HOSPITAL_BASED_OUTPATIENT_CLINIC_OR_DEPARTMENT_OTHER): Payer: Self-pay | Admitting: *Deleted

## 2018-12-25 ENCOUNTER — Other Ambulatory Visit: Payer: Self-pay

## 2018-12-25 ENCOUNTER — Other Ambulatory Visit (HOSPITAL_COMMUNITY)
Admission: RE | Admit: 2018-12-25 | Discharge: 2018-12-25 | Disposition: A | Discharge status: Home / Self Care | Payer: 59 | Source: Ambulatory Visit | Attending: Specialist | Admitting: Specialist

## 2018-12-25 DIAGNOSIS — Z20828 Contact with and (suspected) exposure to other viral communicable diseases: Secondary | ICD-10-CM | POA: Insufficient documentation

## 2018-12-25 DIAGNOSIS — Z01812 Encounter for preprocedural laboratory examination: Secondary | ICD-10-CM | POA: Insufficient documentation

## 2018-12-25 LAB — SARS CORONAVIRUS 2 (TAT 6-24 HRS): SARS Coronavirus 2: NEGATIVE

## 2018-12-25 NOTE — Progress Notes (Signed)
Spoke with patient via telephone for pre op interview. No solids after MN. May have clear liquids until 0815 AM. Patient verbalized understanding of clear liquid diet instructions and no milk products. Arrival time 1215.

## 2018-12-28 ENCOUNTER — Encounter (HOSPITAL_BASED_OUTPATIENT_CLINIC_OR_DEPARTMENT_OTHER): Payer: Self-pay

## 2018-12-28 ENCOUNTER — Ambulatory Visit (HOSPITAL_BASED_OUTPATIENT_CLINIC_OR_DEPARTMENT_OTHER)
Admission: RE | Admit: 2018-12-28 | Discharge: 2018-12-28 | Disposition: A | Discharge status: Home / Self Care | Payer: 59 | Attending: Specialist | Admitting: Specialist

## 2018-12-28 ENCOUNTER — Ambulatory Visit (HOSPITAL_BASED_OUTPATIENT_CLINIC_OR_DEPARTMENT_OTHER): Payer: 59 | Admitting: Anesthesiology

## 2018-12-28 ENCOUNTER — Encounter (HOSPITAL_BASED_OUTPATIENT_CLINIC_OR_DEPARTMENT_OTHER)
Admission: RE | Disposition: A | Discharge status: Home / Self Care | Payer: Self-pay | Source: Home / Self Care | Attending: Specialist

## 2018-12-28 DIAGNOSIS — Z881 Allergy status to other antibiotic agents status: Secondary | ICD-10-CM | POA: Diagnosis not present

## 2018-12-28 DIAGNOSIS — Z7982 Long term (current) use of aspirin: Secondary | ICD-10-CM | POA: Insufficient documentation

## 2018-12-28 DIAGNOSIS — K219 Gastro-esophageal reflux disease without esophagitis: Secondary | ICD-10-CM | POA: Insufficient documentation

## 2018-12-28 DIAGNOSIS — S83232A Complex tear of medial meniscus, current injury, left knee, initial encounter: Secondary | ICD-10-CM | POA: Diagnosis not present

## 2018-12-28 DIAGNOSIS — M94262 Chondromalacia, left knee: Secondary | ICD-10-CM | POA: Insufficient documentation

## 2018-12-28 DIAGNOSIS — Z87891 Personal history of nicotine dependence: Secondary | ICD-10-CM | POA: Diagnosis not present

## 2018-12-28 DIAGNOSIS — E785 Hyperlipidemia, unspecified: Secondary | ICD-10-CM | POA: Diagnosis not present

## 2018-12-28 DIAGNOSIS — G4730 Sleep apnea, unspecified: Secondary | ICD-10-CM | POA: Diagnosis not present

## 2018-12-28 DIAGNOSIS — I2510 Atherosclerotic heart disease of native coronary artery without angina pectoris: Secondary | ICD-10-CM | POA: Insufficient documentation

## 2018-12-28 DIAGNOSIS — Z79899 Other long term (current) drug therapy: Secondary | ICD-10-CM | POA: Insufficient documentation

## 2018-12-28 DIAGNOSIS — S83522A Sprain of posterior cruciate ligament of left knee, initial encounter: Secondary | ICD-10-CM | POA: Diagnosis not present

## 2018-12-28 DIAGNOSIS — X58XXXA Exposure to other specified factors, initial encounter: Secondary | ICD-10-CM | POA: Insufficient documentation

## 2018-12-28 DIAGNOSIS — Z882 Allergy status to sulfonamides status: Secondary | ICD-10-CM | POA: Insufficient documentation

## 2018-12-28 DIAGNOSIS — G4733 Obstructive sleep apnea (adult) (pediatric): Secondary | ICD-10-CM | POA: Diagnosis not present

## 2018-12-28 DIAGNOSIS — G8918 Other acute postprocedural pain: Secondary | ICD-10-CM | POA: Diagnosis not present

## 2018-12-28 DIAGNOSIS — M1710 Unilateral primary osteoarthritis, unspecified knee: Secondary | ICD-10-CM | POA: Diagnosis not present

## 2018-12-28 DIAGNOSIS — M23204 Derangement of unspecified medial meniscus due to old tear or injury, left knee: Secondary | ICD-10-CM | POA: Diagnosis present

## 2018-12-28 HISTORY — DX: Sleep apnea, unspecified: G47.30

## 2018-12-28 HISTORY — PX: KNEE ARTHROSCOPY WITH MEDIAL MENISECTOMY: SHX5651

## 2018-12-28 SURGERY — ARTHROSCOPY, KNEE, WITH MEDIAL MENISCECTOMY
Anesthesia: General | Site: Knee | Laterality: Left

## 2018-12-28 MED ORDER — BUPIVACAINE HCL 0.25 % IJ SOLN
INTRAMUSCULAR | Status: DC | PRN
  Administered 2018-12-28: 20 mL

## 2018-12-28 MED ORDER — MEPERIDINE HCL 25 MG/ML IJ SOLN
6.2500 mg | INTRAMUSCULAR | Status: DC | PRN
  Filled 2018-12-28: qty 1

## 2018-12-28 MED ORDER — DEXAMETHASONE SODIUM PHOSPHATE 10 MG/ML IJ SOLN
INTRAMUSCULAR | Status: DC | PRN
  Administered 2018-12-28: 10 mg via INTRAVENOUS

## 2018-12-28 MED ORDER — FENTANYL CITRATE (PF) 100 MCG/2ML IJ SOLN
INTRAMUSCULAR | Status: AC
  Filled 2018-12-28: qty 2

## 2018-12-28 MED ORDER — CLONIDINE HCL (ANALGESIA) 100 MCG/ML EP SOLN
EPIDURAL | Status: DC | PRN
  Administered 2018-12-28: 100 ug

## 2018-12-28 MED ORDER — SODIUM CHLORIDE 0.9 % IR SOLN
Status: DC | PRN
  Administered 2018-12-28: 6000 mL

## 2018-12-28 MED ORDER — OXYCODONE HCL 5 MG/5ML PO SOLN
5.0000 mg | Freq: Once | ORAL | Status: DC | PRN
  Filled 2018-12-28: qty 5

## 2018-12-28 MED ORDER — MORPHINE SULFATE (PF) 4 MG/ML IV SOLN
INTRAVENOUS | Status: DC | PRN
  Administered 2018-12-28: 4 mg via SUBCUTANEOUS

## 2018-12-28 MED ORDER — MIDAZOLAM HCL 2 MG/2ML IJ SOLN
2.0000 mg | Freq: Once | INTRAMUSCULAR | Status: AC
  Administered 2018-12-28: 2 mg via INTRAVENOUS
  Filled 2018-12-28: qty 2

## 2018-12-28 MED ORDER — MIDAZOLAM HCL 5 MG/5ML IJ SOLN
INTRAMUSCULAR | Status: DC | PRN
  Administered 2018-12-28: 2 mg via INTRAVENOUS

## 2018-12-28 MED ORDER — SCOPOLAMINE 1 MG/3DAYS TD PT72
MEDICATED_PATCH | TRANSDERMAL | Status: AC
  Filled 2018-12-28: qty 1

## 2018-12-28 MED ORDER — FENTANYL CITRATE (PF) 100 MCG/2ML IJ SOLN
INTRAMUSCULAR | Status: DC | PRN
  Administered 2018-12-28: 50 ug via INTRAVENOUS

## 2018-12-28 MED ORDER — DEXAMETHASONE SODIUM PHOSPHATE 10 MG/ML IJ SOLN
INTRAMUSCULAR | Status: AC
  Filled 2018-12-28: qty 1

## 2018-12-28 MED ORDER — MIDAZOLAM HCL 2 MG/2ML IJ SOLN
INTRAMUSCULAR | Status: AC
  Filled 2018-12-28: qty 2

## 2018-12-28 MED ORDER — MORPHINE SULFATE (PF) 4 MG/ML IV SOLN
INTRAVENOUS | Status: AC
  Filled 2018-12-28: qty 1

## 2018-12-28 MED ORDER — PROPOFOL 10 MG/ML IV BOLUS
INTRAVENOUS | Status: DC | PRN
  Administered 2018-12-28: 300 mg via INTRAVENOUS

## 2018-12-28 MED ORDER — CEFAZOLIN SODIUM-DEXTROSE 2-4 GM/100ML-% IV SOLN
2.0000 g | INTRAVENOUS | Status: AC
  Administered 2018-12-28: 2 g via INTRAVENOUS
  Filled 2018-12-28: qty 100

## 2018-12-28 MED ORDER — SCOPOLAMINE 1 MG/3DAYS TD PT72
1.0000 | MEDICATED_PATCH | TRANSDERMAL | Status: DC
  Administered 2018-12-28: 1.5 mg via TRANSDERMAL
  Filled 2018-12-28: qty 1

## 2018-12-28 MED ORDER — ONDANSETRON HCL 4 MG/2ML IJ SOLN
4.0000 mg | Freq: Once | INTRAMUSCULAR | Status: DC | PRN
  Filled 2018-12-28: qty 2

## 2018-12-28 MED ORDER — ACETAMINOPHEN 325 MG PO TABS
325.0000 mg | ORAL_TABLET | ORAL | Status: DC | PRN
  Filled 2018-12-28: qty 2

## 2018-12-28 MED ORDER — KETOROLAC TROMETHAMINE 30 MG/ML IJ SOLN
30.0000 mg | Freq: Once | INTRAMUSCULAR | Status: DC | PRN
  Filled 2018-12-28: qty 1

## 2018-12-28 MED ORDER — LIDOCAINE 2% (20 MG/ML) 5 ML SYRINGE
INTRAMUSCULAR | Status: AC
  Filled 2018-12-28: qty 5

## 2018-12-28 MED ORDER — ROPIVACAINE HCL 7.5 MG/ML IJ SOLN
INTRAMUSCULAR | Status: DC | PRN
  Administered 2018-12-28 (×5): 5 mL via PERINEURAL

## 2018-12-28 MED ORDER — TRIAMCINOLONE ACETONIDE 40 MG/ML IJ SUSP
INTRAMUSCULAR | Status: AC
  Filled 2018-12-28: qty 1

## 2018-12-28 MED ORDER — ONDANSETRON HCL 4 MG/2ML IJ SOLN
INTRAMUSCULAR | Status: AC
  Filled 2018-12-28: qty 2

## 2018-12-28 MED ORDER — LACTATED RINGERS IV SOLN
INTRAVENOUS | Status: DC
  Administered 2018-12-28: 13:00:00 via INTRAVENOUS
  Filled 2018-12-28: qty 1000

## 2018-12-28 MED ORDER — PROPOFOL 10 MG/ML IV BOLUS
INTRAVENOUS | Status: AC
  Filled 2018-12-28: qty 40

## 2018-12-28 MED ORDER — ONDANSETRON HCL 4 MG/2ML IJ SOLN
INTRAMUSCULAR | Status: DC | PRN
  Administered 2018-12-28: 4 mg via INTRAVENOUS

## 2018-12-28 MED ORDER — ACETAMINOPHEN 160 MG/5ML PO SOLN
325.0000 mg | ORAL | Status: DC | PRN
  Filled 2018-12-28: qty 20.3

## 2018-12-28 MED ORDER — LIDOCAINE 2% (20 MG/ML) 5 ML SYRINGE
INTRAMUSCULAR | Status: DC | PRN
  Administered 2018-12-28: 100 mg via INTRAVENOUS

## 2018-12-28 MED ORDER — OXYCODONE HCL 5 MG PO TABS
5.0000 mg | ORAL_TABLET | Freq: Once | ORAL | Status: DC | PRN
  Filled 2018-12-28: qty 1

## 2018-12-28 MED ORDER — FENTANYL CITRATE (PF) 100 MCG/2ML IJ SOLN
100.0000 ug | Freq: Once | INTRAMUSCULAR | Status: AC
  Administered 2018-12-28: 100 ug via INTRAVENOUS
  Filled 2018-12-28: qty 2

## 2018-12-28 MED ORDER — FENTANYL CITRATE (PF) 100 MCG/2ML IJ SOLN
25.0000 ug | INTRAMUSCULAR | Status: DC | PRN
  Filled 2018-12-28: qty 1

## 2018-12-28 MED ORDER — POVIDONE-IODINE 7.5 % EX SOLN
Freq: Once | CUTANEOUS | Status: DC
  Filled 2018-12-28: qty 118

## 2018-12-28 MED ORDER — CEFAZOLIN SODIUM-DEXTROSE 2-4 GM/100ML-% IV SOLN
INTRAVENOUS | Status: AC
  Filled 2018-12-28: qty 100

## 2018-12-28 SURGICAL SUPPLY — 41 items
ABLATOR ASPIRATE 50D MULTI-PRT (SURGICAL WAND) ×2 IMPLANT
BANDAGE ESMARK 6X9 LF (GAUZE/BANDAGES/DRESSINGS) ×1 IMPLANT
BLADE EXCALIBUR 4.0X13 (MISCELLANEOUS) ×2 IMPLANT
BNDG ESMARK 6X9 LF (GAUZE/BANDAGES/DRESSINGS) ×2
BNDG GAUZE ELAST 4 BULKY (GAUZE/BANDAGES/DRESSINGS) ×2 IMPLANT
CANISTER SUCTION 1200CC (MISCELLANEOUS) IMPLANT
CONT SPECI 4OZ STER CLIK (MISCELLANEOUS) IMPLANT
COVER WAND RF STERILE (DRAPES) IMPLANT
CUFF TOURN SGL QUICK 34 (TOURNIQUET CUFF) ×1
CUFF TRNQT CYL 34X4.125X (TOURNIQUET CUFF) ×1 IMPLANT
DRAPE ARTHROSCOPY W/POUCH 114 (DRAPES) ×2 IMPLANT
DRAPE INCISE IOBAN 66X45 STRL (DRAPES) ×2 IMPLANT
DRAPE U-SHAPE 47X51 STRL (DRAPES) ×2 IMPLANT
DRSG PAD ABDOMINAL 8X10 ST (GAUZE/BANDAGES/DRESSINGS) ×2 IMPLANT
DURAPREP 26ML APPLICATOR (WOUND CARE) ×2 IMPLANT
ELECT MENISCUS 165MM 90D (ELECTRODE) IMPLANT
EXCALIBUR 3.8MM X 13CM (MISCELLANEOUS) ×2 IMPLANT
GAUZE SPONGE 4X4 12PLY STRL (GAUZE/BANDAGES/DRESSINGS) ×2 IMPLANT
GAUZE SPONGE 4X4 12PLY STRL LF (GAUZE/BANDAGES/DRESSINGS) ×2 IMPLANT
GAUZE XEROFORM 1X8 LF (GAUZE/BANDAGES/DRESSINGS) IMPLANT
GLOVE BIO SURGEON STRL SZ7.5 (GLOVE) IMPLANT
GLOVE BIO SURGEON STRL SZ8 (GLOVE) ×2 IMPLANT
GLOVE INDICATOR 8.0 STRL GRN (GLOVE) ×2 IMPLANT
GOWN STRL REUS W/TWL XL LVL3 (GOWN DISPOSABLE) ×4 IMPLANT
IV NS IRRIG 3000ML ARTHROMATIC (IV SOLUTION) ×4 IMPLANT
KIT TURNOVER CYSTO (KITS) ×2 IMPLANT
KNEE WRAP E Z 3 GEL PACK (MISCELLANEOUS) ×2 IMPLANT
MANIFOLD NEPTUNE II (INSTRUMENTS) ×2 IMPLANT
NEEDLE HYPO 22GX1.5 SAFETY (NEEDLE) ×2 IMPLANT
PACK ARTHROSCOPY DSU (CUSTOM PROCEDURE TRAY) ×2 IMPLANT
PACK BASIN DAY SURGERY FS (CUSTOM PROCEDURE TRAY) ×2 IMPLANT
PAD ABD 8X10 STRL (GAUZE/BANDAGES/DRESSINGS) ×2 IMPLANT
PAD ARMBOARD 7.5X6 YLW CONV (MISCELLANEOUS) IMPLANT
SUT ETHILON 4 0 PS 2 18 (SUTURE) ×2 IMPLANT
SYR CONTROL 10ML LL (SYRINGE) ×2 IMPLANT
TOWEL OR 17X26 10 PK STRL BLUE (TOWEL DISPOSABLE) ×2 IMPLANT
TUBE CONNECTING 12X1/4 (SUCTIONS) ×2 IMPLANT
TUBING ARTHROSCOPY IRRIG 16FT (MISCELLANEOUS) ×2 IMPLANT
TUBING REDEUCE PUMP W/CON 8IN (MISCELLANEOUS) ×2 IMPLANT
WATER STERILE IRR 500ML POUR (IV SOLUTION) IMPLANT
WRAP KNEE MAXI GEL POST OP (GAUZE/BANDAGES/DRESSINGS) ×2 IMPLANT

## 2018-12-28 NOTE — Anesthesia Procedure Notes (Signed)
Procedure Name: LMA Insertion Date/Time: 12/28/2018 3:31 PM Performed by: Bonney Aid, CRNA Pre-anesthesia Checklist: Patient identified, Emergency Drugs available, Suction available and Patient being monitored Patient Re-evaluated:Patient Re-evaluated prior to induction Oxygen Delivery Method: Circle system utilized Preoxygenation: Pre-oxygenation with 100% oxygen Induction Type: IV induction Ventilation: Mask ventilation without difficulty LMA: LMA with gastric port inserted LMA Size: 5.0 Number of attempts: 1 Airway Equipment and Method: Bite block Placement Confirmation: positive ETCO2 Tube secured with: Tape Dental Injury: Teeth and Oropharynx as per pre-operative assessment

## 2018-12-28 NOTE — Anesthesia Procedure Notes (Signed)
Anesthesia Regional Block: Adductor canal block   Pre-Anesthetic Checklist: ,, timeout performed, Correct Patient, Correct Site, Correct Laterality, Correct Procedure, Correct Position, site marked, Risks and benefits discussed,  Surgical consent,  Pre-op evaluation,  At surgeon's request and post-op pain management  Laterality: Lower and Left  Prep: chloraprep       Needles:  Injection technique: Single-shot  Needle Type: Echogenic Stimulator Needle     Needle Length: 10cm  Needle Gauge: 21   Needle insertion depth: 3 cm   Additional Needles:   Procedures:,,,, ultrasound used (permanent image in chart),,,,  Narrative:  Start time: 12/28/2018 1:15 PM End time: 12/28/2018 1:25 PM Anesthesiologist: Lyn Hollingshead, MD

## 2018-12-28 NOTE — Anesthesia Preprocedure Evaluation (Signed)
Anesthesia Evaluation  Patient identified by MRN, date of birth, ID band Patient awake    Reviewed: Allergy & Precautions, NPO status , Patient's Chart, lab work & pertinent test results  History of Anesthesia Complications (+) PONV and history of anesthetic complications  Airway Mallampati: II       Dental no notable dental hx. (+) Teeth Intact   Pulmonary sleep apnea , former smoker,    Pulmonary exam normal breath sounds clear to auscultation       Cardiovascular Normal cardiovascular exam Rhythm:Regular Rate:Normal     Neuro/Psych negative neurological ROS  negative psych ROS   GI/Hepatic Neg liver ROS,   Endo/Other  negative endocrine ROS  Renal/GU negative Renal ROS  negative genitourinary   Musculoskeletal   Abdominal (+) + obese,   Peds  Hematology negative hematology ROS (+)   Anesthesia Other Findings   Reproductive/Obstetrics                             Anesthesia Physical Anesthesia Plan  ASA: II  Anesthesia Plan: General   Post-op Pain Management:  Regional for Post-op pain   Induction:   PONV Risk Score and Plan: 3 and Ondansetron, Dexamethasone, Scopolamine patch - Pre-op and Midazolam  Airway Management Planned: LMA  Additional Equipment: None  Intra-op Plan:   Post-operative Plan:   Informed Consent: I have reviewed the patients History and Physical, chart, labs and discussed the procedure including the risks, benefits and alternatives for the proposed anesthesia with the patient or authorized representative who has indicated his/her understanding and acceptance.     Dental advisory given  Plan Discussed with: CRNA  Anesthesia Plan Comments:         Anesthesia Quick Evaluation

## 2018-12-28 NOTE — H&P (Signed)
David Newman is an 58 y.o. male.  My left knee hurts Chief Complaint: My left knee hurts HPI: Patient injured his left knee February 2020 history examination MRI scan was consistent with sprain of the PCL ligament and a torn medial meniscus.  He has been thoroughly rehabilitated and despite this he has persistent pain mechanical symptoms dysfunction of his left knee is here for arthroscopic evaluation and treatment thereof.  Past Medical History:  Diagnosis Date  . Anal fissure   . At risk for sleep apnea    STOP-BANG= 5    SENT TO PCP 03-27-2014  . CAD (coronary artery disease)   . Colon polyps    hyperplastic  . GERD (gastroesophageal reflux disease)   . Hyperlipidemia   . OA (osteoarthritis) of knee   . Right knee meniscal tear   . Sleep apnea    Wears CPAP.  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 Wears contact lenses   . Wears glasses     Past Surgical History:  Procedure Laterality Date  . COLONOSCOPY  04-24-2012  . FACIAL RECONSTRUCTION SURGERY  1973  age 65  . KNEE ARTHROSCOPY Right 03/29/2014   Procedure: RIGHT ARTHROSCOPY KNEE WITH DEBRIDMENT PARTIAL medial lateral MENISCECTOMY AND CHONDROPLASTY;  Surgeon: Sydnee Cabal, MD;  Location: [facility];  Service: Orthopedics;  Laterality: Right;  . KNEE ARTHROSCOPY WITH ANTERIOR CRUCIATE LIGAMENT (ACL) REPAIR Right 1994  . WISDOM TOOTH EXTRACTION  age 22    Family History  Problem Relation Age of Onset  . Colon cancer Paternal Uncle 95  . Colon cancer Cousin 84  . Colon polyps Brother 33  . Heart disease Father   . Skin cancer Father   . Heart disease Brother   . Alzheimer's disease Mother   . Stomach cancer Neg Hx    Social History:  reports that he quit smoking about 11 years ago. His smoking use included cigarettes. He has a 35.00 pack-year smoking history. He has never used smokeless tobacco. He reports current alcohol use. He reports that he does not use drugs.  Allergies:  Allergies   Allergen Reactions  . Sulfa Antibiotics Nausea And Vomiting    Medications Prior to Admission  Medication Sig Dispense Refill  . aspirin 81 MG chewable tablet Chew 81 mg by mouth.    Marland Kitchen LIALDA 1.2 g EC tablet TAKE 4 TABLETS (4.8 G TOTAL) BY MOUTH DAILY WITH BREAKFAST. 360 tablet 1  . LORazepam (ATIVAN) 1 MG tablet 1/2  - 1 1/2 po qhs prn    . rosuvastatin (CRESTOR) 5 MG tablet Take 5 mg by mouth as needed. Reported on 06/13/2015    . calcium carbonate (TUMS - DOSED IN MG ELEMENTAL CALCIUM) 500 MG chewable tablet Chew 1 tablet by mouth as needed for indigestion or heartburn. Reported on 05/15/2015    . mesalamine (LIALDA) 1.2 g EC tablet Take 4 tablets (4.8 g total) by mouth daily with breakfast. 360 tablet 3  . mesalamine (ROWASA) 4 g enema INSERT 60 MLS RECTALLY AT BEDTIME. HOLD FOR 30 MINUTES IF POSSIBLE. 1680 mL 11    No results found for this or any previous visit (from the past 48 hour(s)). No results found.  Review of Systems  All other systems reviewed and are negative.   Blood pressure (!) 145/97, pulse 86, temperature 98.2 F (36.8 C), temperature source Oral, resp. rate 17, height 6' 1"  (1.854 m), weight 111.4 kg, SpO2 99 %. Physical Exam  Constitutional: He is oriented to  person, place, and time. He appears well-developed and well-nourished.  HENT:  Head: Normocephalic and atraumatic.  Eyes: Pupils are equal, round, and reactive to light. Conjunctivae are normal.  Neck: Normal range of motion. Neck supple.  Cardiovascular: Normal rate and regular rhythm.  Respiratory: Effort normal and breath sounds normal.  GI: Soft. Bowel sounds are normal.  Musculoskeletal:     Left knee: He exhibits decreased range of motion and effusion. Tenderness found. Medial joint line tenderness noted.  Neurological: He is alert and oriented to person, place, and time. He has normal reflexes.  Skin: Skin is warm and dry.  Psychiatric: He has a normal mood and affect. His behavior is normal.  Judgment and thought content normal.     Assessment/Plan 47 -year-old male with symptomatic left knee with persistent pain and mechanical symptoms despite conservative treatment.  Plan be left knee arthroscopic evaluation partial meniscectomy as indicated by waist in the PCL.  All risks and benefits of full explained before surgery he understood and wished to proceed.  Cynda Familia, MD 12/28/2018, 3:20 PM

## 2018-12-28 NOTE — Discharge Instructions (Signed)
°  Post Anesthesia Home Care Instructions  Activity: Get plenty of rest for the remainder of the day. A responsible individual must stay with you for 24 hours following the procedure.  For the next 24 hours, DO NOT: -Drive a car -Paediatric nurse -Drink alcoholic beverages -Take any medication unless instructed by your physician -Make any legal decisions or sign important papers.  Meals: Start with liquid foods such as gelatin or soup. Progress to regular foods as tolerated. Avoid greasy, spicy, heavy foods. If nausea and/or vomiting occur, drink only clear liquids until the nausea and/or vomiting subsides. Call your physician if vomiting continues.  Special Instructions/Symptoms: Your throat may feel dry or sore from the anesthesia or the breathing tube placed in your throat during surgery. If this causes discomfort, gargle with warm salt water. The discomfort should disappear within 24 hours.  If you had a scopolamine patch placed behind your ear for the management of post- operative nausea and/or vomiting:  1. The medication in the patch is effective for 72 hours, after which it should be removed.  Wrap patch in a tissue and discard in the trash. Wash hands thoroughly with soap and water. 2. You may remove the patch earlier than 72 hours if you experience unpleasant side effects which may include dry mouth, dizziness or visual disturbances. 3. Avoid touching the patch. Wash your hands with soap and water after contact with the patch.    May remove patch behind ear Sunday, December 31, 2018.  Regional Anesthesia Blocks  1. Numbness or the inability to move the "blocked" extremity may last from 3-48 hours after placement. The length of time depends on the medication injected and your individual response to the medication. If the numbness is not going away after 48 hours, call your surgeon.  2. The extremity that is blocked will need to be protected until the numbness is gone and the   Strength has returned. Because you cannot feel it, you will need to take extra care to avoid injury. Because it may be weak, you may have difficulty moving it or using it. You may not know what position it is in without looking at it while the block is in effect.  3. For blocks in the legs and feet, returning to weight bearing and walking needs to be done carefully. You will need to wait until the numbness is entirely gone and the strength has returned. You should be able to move your leg and foot normally before you try and bear weight or walk. You will need someone to be with you when you first try to ensure you do not fall and possibly risk injury.  4. Bruising and tenderness at the needle site are common side effects and will resolve in a few days.  5. Persistent numbness or new problems with movement should be communicated to the surgeon or the [facility] [phone]).  Report any signs of compromised circulation in your left leg such as blue toes, cold to touch, changes in sensation once the block wears off, or a change in pain.

## 2018-12-28 NOTE — Op Note (Signed)
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 grade 3 femoral trochlea #3 PCL grade 1 sprain Procedures none #1 left knee arthroscopic partial medial meniscectomy chondroplasty femoral trochlea and medial femoral condyle Surgeon Hart Robinsons, MD Assistant [place] [facility] with intraoperative knee block Drains none Complications none Tourniquet time none Disposition PACU stable  Operative details patient counseling holding a correct size then a 5 marked signed appropriate TED hose was applied uninvolved leg IV antibiotics were given taken the operating room placed supine position under general anesthesia left thigh was placed to a thigh holder prepped with DuraPrep and draped in sterile fashion timeout done confirm the left side arthroscopic portals were established inferomedial inferolateral diagnostic arthroscopy with normal tracking patellofemoral joint grade III chondromalacia of the central femoral trochlea and light chondroplasty for shaver but stable base suprapatellar pouch the lateral gutters unremarkable ACL was intact the PCL showed a grade 1 sprain but integrity was intact.  Lateral side was inspected to culture was healthy as was lateral meniscus   Part inspected complex tear the posterior horn of the medial meniscus using baskets motorized shaver and cautery a partial meniscectomy was performed back to stable base part beveled and contoured grade III chondromalacia medial condyle with early grade 4 Vicon Plus shaver back to stable base no other abnormalities noted irrigated arthroscopic was removed.  The 2 portals closed 4 nylon suture 10 cc of Marcaine 0.25% Marcaine placed to the skin and 10 cc of 0.5% Marcaine in formalin morphine sulfate was injected to the knee  joint.  Satisfied TED hose ice pack he was awakened taken from the operating room PACU stable condition.  To be stabilized PACU discharged home he will be seen back in office in 2 weeks start physical therapy next week prognosis is good I would recommend consideration of viscosupplementation after 90 days if he continues to have problems.  As far as reconstructive surgery later I do not think is a good candidate for medial unicompartmental arthroplasty as he has does have a PCL sprain with some degree of instability.

## 2018-12-28 NOTE — Anesthesia Postprocedure Evaluation (Signed)
Anesthesia Post Note  Patient: David Newman  Procedure(s) Performed: KNEE ARTHROSCOPY WITH partial MEDIAL MENISECTOMY, debridement chondroplasty (Left Knee)     Patient location during evaluation: PACU Anesthesia Type: General Level of consciousness: sedated Pain management: pain level controlled Vital Signs Assessment: post-procedure vital signs reviewed and stable Respiratory status: spontaneous breathing Cardiovascular status: stable Postop Assessment: no apparent nausea or vomiting Anesthetic complications: no    Last Vitals:  Vitals:   12/28/18 1624 12/28/18 1630  BP: 116/78 109/72  Pulse: 63 (!) 55  Resp: 19 17  Temp: 36.6 C   SpO2: 99% 98%    Last Pain:  Vitals:   12/28/18 1624  TempSrc:   PainSc: 0-No pain   Pain Goal: Patients Stated Pain Goal: 4 (12/28/18 1246)                 Huston Foley

## 2018-12-28 NOTE — Transfer of Care (Signed)
Immediate Anesthesia Transfer of Care Note  Patient: David Newman  Procedure(s) Performed: KNEE ARTHROSCOPY WITH partial MEDIAL MENISECTOMY, debridement chondroplasty (Left Knee)  Patient Location: PACU  Anesthesia Type:General  Level of Consciousness: awake, alert  and oriented  Airway & Oxygen Therapy: Patient Spontanous Breathing and Patient connected to nasal cannula oxygen  Post-op Assessment: Report given to RN  Post vital signs: Reviewed and stable  Last Vitals:  Vitals Value Taken Time  BP 116/78 12/28/18 1624  Temp 36.6 C 12/28/18 1624  Pulse 63 12/28/18 1625  Resp 15 12/28/18 1625  SpO2 99 % 12/28/18 1625  Vitals shown include unvalidated device data.  Last Pain:  Vitals:   12/28/18 1246  TempSrc:   PainSc: 0-No pain      Patients Stated Pain Goal: 4 (66/06/00 4599)  Complications: No apparent anesthesia complications

## 2018-12-28 NOTE — Progress Notes (Signed)
Assisted Dr. Hatchett with left, ultrasound guided, adductor canal block. Side rails up, monitors on throughout procedure. See vital signs in flow sheet. Tolerated Procedure well. 

## 2018-12-29 ENCOUNTER — Encounter (HOSPITAL_BASED_OUTPATIENT_CLINIC_OR_DEPARTMENT_OTHER): Payer: Self-pay | Admitting: Specialist

## 2018-12-29 NOTE — Addendum Note (Signed)
Addendum  created 12/29/18 1230 by Bonney Aid, CRNA   Charge Capture section accepted

## 2019-01-05 ENCOUNTER — Encounter: Payer: Self-pay | Admitting: Physical Therapy

## 2019-01-05 ENCOUNTER — Ambulatory Visit: Payer: 59 | Attending: Specialist | Admitting: Physical Therapy

## 2019-01-05 ENCOUNTER — Other Ambulatory Visit: Payer: Self-pay

## 2019-01-05 DIAGNOSIS — R600 Localized edema: Secondary | ICD-10-CM | POA: Insufficient documentation

## 2019-01-05 DIAGNOSIS — M25562 Pain in left knee: Secondary | ICD-10-CM | POA: Insufficient documentation

## 2019-01-05 DIAGNOSIS — M6281 Muscle weakness (generalized): Secondary | ICD-10-CM | POA: Insufficient documentation

## 2019-01-05 DIAGNOSIS — M25662 Stiffness of left knee, not elsewhere classified: Secondary | ICD-10-CM | POA: Diagnosis not present

## 2019-01-05 DIAGNOSIS — R262 Difficulty in walking, not elsewhere classified: Secondary | ICD-10-CM | POA: Diagnosis not present

## 2019-01-05 NOTE — Therapy (Signed)
[facility] [address], [place], [place], [address] Phone: [phone]   Fax:  [phone]  Physical Therapy Evaluation  Patient Details  Name: David Newman MRN: [id number] Date of Birth: May 12, 1961 Referring Provider (PT): Dr. Sydnee Cabal    Encounter Date: 01/05/2019  PT End of Session - 01/05/19 1756    Visit Number  1    Date for PT Re-Evaluation  03/02/19    PT Start Time  0900    PT Stop Time  0945    PT Time Calculation (min)  45 min    Activity Tolerance  Patient tolerated treatment well       Past Medical History:  Diagnosis Date  . Anal fissure   . At risk for sleep apnea    STOP-BANG= 5    SENT TO PCP 03-27-2014  . CAD (coronary artery disease)   . Colon polyps    hyperplastic  . GERD (gastroesophageal reflux disease)   . Hyperlipidemia   . OA (osteoarthritis) of knee   . Right knee meniscal tear   . Sleep apnea    Wears CPAP.  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 Wears contact lenses   . Wears glasses     Past Surgical History:  Procedure Laterality Date  . COLONOSCOPY  04-24-2012  . FACIAL RECONSTRUCTION SURGERY  1973  age 22  . KNEE ARTHROSCOPY Right 03/29/2014   Procedure: RIGHT ARTHROSCOPY KNEE WITH DEBRIDMENT PARTIAL medial lateral MENISCECTOMY AND CHONDROPLASTY;  Surgeon: Sydnee Cabal, MD;  Location: [facility];  Service: Orthopedics;  Laterality: Right;  . KNEE ARTHROSCOPY WITH ANTERIOR CRUCIATE LIGAMENT (ACL) REPAIR Right 1994  . KNEE ARTHROSCOPY WITH MEDIAL MENISECTOMY Left 12/28/2018   Procedure: KNEE ARTHROSCOPY WITH partial MEDIAL MENISECTOMY, debridement chondroplasty;  Surgeon: Sydnee Cabal, MD;  Location: [facility];  Service: Orthopedics;  Laterality: Left;  with knee block  . WISDOM TOOTH EXTRACTION  age 58    There were no vitals filed for this visit.   Subjective Assessment - 01/05/19 0904    Subjective  Left knee  injury in February while carrying cooler with food , twisted and things shifted.  ACL ok, PCL tear; high medial meniscus tear;  Used crutches 2-3 days but presents without assistive device.  Wearing knee brace.  Significant limp.    Pertinent History  right knee ACL and scope;  weakness from L5-S1 left LE had previous PT last Winter at [facility];  colitis;  patient notes preoperative muscle atrophy in thigh;  left knee scope 12/28/18    Limitations  Walking;Standing;Sitting;House hold activities    How long can you sit comfortably?  gets uncomfortable quickly if not elevated    How long can you stand comfortably?  < 10 minutes    How long can you walk comfortably?  only walked in [facility]    Diagnostic tests  MRI    Patient Stated Goals  Get back to where I was;  walk 1-2 miles a day; ride a bike; some level of exercise, push mower; return to trout fishing; kayak    Currently in Pain?  Yes    Pain Score  2     Pain Location  Knee    Pain Orientation  Left    Pain Descriptors / Indicators  Sore    Pain Type  Surgical pain    Aggravating Factors   standing to cook; walking; sitting too long at dining room with leg down  Pain Relieving Factors  ice initially but not last 2 days since returning to work         [facility] PT Assessment - 01/05/19 0001      Assessment   Medical Diagnosis  left knee scope    Referring Provider (PT)  Dr. Sydnee Cabal     Onset Date/Surgical Date  12/29/18    Next MD Visit  next Friday    Prior Therapy  right knee; more recently L5;S1 PT for back       Precautions   Precautions  None      Restrictions   Weight Bearing Restrictions  No      Balance Screen   Has the patient fallen in the past 6 months  No    Has the patient had a decrease in activity level because of a fear of falling?   No    Is the patient reluctant to leave their home because of a fear of falling?   No      Home Film/video editor residence    Home Access  Stairs to enter     Entrance Stairs-Number of Steps  [place]  One level      Prior Function   Level of Independence  Independent with basic ADLs    Vocation  Full time employment    Vocation Requirements  [employer] working from home     Leisure  fishing from [place];  walking for exercise      Observation/Other Assessments   Observations  bandaids covering scope incisions     Focus on Therapeutic Outcomes (FOTO)   57% limitation       Circumferential Edema   Circumferential - Right  10cm below t. tubercle 41cm, mid pat 43, 10cm above patella 48cm   right 39 cm, 43 cm, 47 cm     AROM   Right Knee Extension  0    Right Knee Flexion  140    Left Knee Extension  3    Left Knee Flexion  132      Strength   Right Hip Flexion  5/5    Right Hip Extension  4+/5    Right Hip ABduction  4+/5    Left Hip Flexion  4-/5    Left Hip Extension  4-/5    Left Hip ABduction  4-/5    Right Knee Flexion  5/5    Right Knee Extension  5/5    Left Knee Flexion  3/5    Left Knee Extension  3/5      Ambulation/Gait   Gait Pattern  Decreased stance time - left;Decreased hip/knee flexion - left    Gait Comments  no device                 Objective measurements completed on examination: See above findings.              PT Education - 01/05/19 1202    Education Details  [email or web address] added to previous [place] account for LBP on previous admission;  heel slides in supine and seated; SLR; hip abduction; hip extension    Person(s) Educated  Patient    Methods  Explanation;Demonstration;Handout    Comprehension  Returned demonstration;Verbalized understanding       PT Short Term Goals - 01/05/19 1825      PT SHORT TERM GOAL #1   Title  Pt will demo consistency and independence with his  initial HEP to improve ROM and muscle activation    Time  4    Period  Weeks    Status  New    Target Date  02/02/19      PT SHORT TERM GOAL #2   Title  The patient will be able to walk 10-15 minutes  for short community ambulation for grocery shopping    Time  4    Period  Weeks    Status  New      PT SHORT TERM GOAL #3   Title  The patient will have improved left knee ROM 0-138 needed for greater ease getting in/out of the car    Time  4    Period  Weeks    Status  New      PT SHORT TERM GOAL #4   Title  The patient will have decreased edema left knee to within 1/2 cm to 1 cm difference to right    Time  4    Period  Weeks    Status  New        PT Long Term Goals - 01/05/19 1827      PT LONG TERM GOAL #1   Title  Pt will be independent with his advanced HEP to allow for continued progress and maintenance of his progress after d/c from PT.     Time  8    Period  Weeks    Status  New    Target Date  03/02/19      PT LONG TERM GOAL #2   Title  Pt will report at least 70% improvement in knee ROM, strength and mobility which will allow him to resume his prior recreational activity, household chores and work tasks    Time  8    Period  Weeks    Status  New      PT LONG TERM GOAL #3   Title  The patient will have 4/5 knee strength and 5-/5 hip strength needed for reciprocal stair climbing, curb negotiation and stooping    Time  8    Period  Weeks    Status  New      PT LONG TERM GOAL #4   Title  The patient will be able to walk 1 mile needed for general health and leisure activity    Time  8    Period  Weeks    Status  New      PT LONG TERM GOAL #5   Title  FOTO functional outcome score will improve from 57% limitation to 31% limitation    Time  8    Period  Weeks    Status  New             Plan - 01/05/19 1204    Clinical Impression Statement  The patient is s/p left arthroscopic partial medial meniscectomy and debridement on 12/28/18.  The patient initially injured his left knee in February.  This was previously his "good knee" having had several right knee surgeries.  He presents without an assistive device but with a significant limp. He has mild to  moderate swelling around the patella, distal thigh 1-2 cm > right.  He reports quad muscle atrophy pre-operatively but difficult to appreciate secondary to edema.    Decreased ROM: 3-132 degrees.  He is able to perform 5 SLRs with increasing quad lag with fatigue.  Decreased hip abductor and hip extensor strength related to this condition or perhaps related to  history of LBP.  He has not attempted community ambulation yet.  He does his steps at home in a 1 at a time fashion.  He is unable to participate in leisure activities of kayaking and fishing.    Personal Factors and Comorbidities  Past/Current Experience;Comorbidity 1;Comorbidity 2    Comorbidities  history of right knee pathology;  history LBP    Examination-Activity Limitations  Locomotion Level;Lift;Stairs;Squat;Carry;Stand    Examination-Participation Restrictions  Meal Prep;Cleaning;Community Activity    Stability/Clinical Decision Making  Stable/Uncomplicated    Clinical Decision Making  Low    Rehab Potential  Good    PT Frequency  3x / week    PT Duration  8 weeks    PT Treatment/Interventions  ADLs/Self Care Home Management;Aquatic Therapy;Cryotherapy;Advice worker;Therapeutic activities;Therapeutic exercise;Iontophoresis 49m/ml Dexamethasone;Patient/family education;Manual techniques;Taping    PT Next Visit Plan  Left knee ROM;  quad activation;  bike or Nu-Step;  vasocompression    PT Home Exercise Plan  [email or web address]    Consulted and Agree with Plan of Care  Patient       Patient will benefit from skilled therapeutic intervention in order to improve the following deficits and impairments:  Abnormal gait, Difficulty walking, Decreased range of motion, Pain, Decreased strength, Increased edema, Decreased activity tolerance  Visit Diagnosis: Acute pain of left knee - Plan: PT plan of care cert/re-cert  Stiffness of left knee, not elsewhere classified - Plan: PT plan of care cert/re-cert  Muscle weakness  (generalized) - Plan: PT plan of care cert/re-cert  Difficulty in walking, not elsewhere classified - Plan: PT plan of care cert/re-cert  Localized edema - Plan: PT plan of care cert/re-cert     Problem List Patient Active Problem List   Diagnosis Date Noted  . Morbid obesity ([place] 08/05/2017  . OSA (obstructive sleep apnea) 04/05/2017  . S/P right knee arthroscopy 03/29/2014   SRuben Im PT 01/05/19 6:35 PM Phone: [phone]: [phone] Singh8/21/2020, 6:35 PM  [facility] [address] [place] [address]Phone: [phone]  Fax:  [phone] Name: MKASHTON MCARTORMRN: [id number] of Birth: 11/06/1960/09/20

## 2019-01-05 NOTE — Patient Instructions (Addendum)
Access Code: [email or web address]  URL: [email or web address]  Date: 01/05/2019  Prepared by: Ruben Im   Exercises  Sidelying Thoracic Lumbar Rotation - 15 reps - 2 sets - 5 hold - 2x daily - 7x weekly  Supine Lower Trunk Rotation - 3 reps - 20 hold - 3x daily - 7x weekly  Seated Figure 4 Piriformis Stretch - 3 reps - 20 hold - 3x daily - 7x weekly  Seated Correct Posture - 1x daily - 7x weekly  Clamshell - 10 reps - 2 sets - 2x daily - 7x weekly  Dead Bug - 10 reps - 2 sets - 3 hold - 1x daily - 7x weekly  Seated Shoulder Horizontal Abduction with Resistance - 10 reps - 2 sets - 2x daily - 7x weekly  Seated Shoulder W External Rotation on Swiss Ball - 10 reps - 2 sets - 2x daily - 7x weekly  Seated Heel Slide - 10 reps - 1 sets - 1x daily - 7x weekly  Supine Heel Slide - 10 reps - 1 sets - 1x daily - 7x weekly  Seated Long Arc Quad - 10 reps - 1 sets - 1x daily - 7x weekly  Prone Hip Extension - Two Pillows - 10 reps - 1 sets - 2x daily - 7x weekly  Supine Straight Leg Raises - 10 reps - 3 sets - 1x daily - 7x weekly  Sidelying Hip Abduction - 10 reps - 3 sets - 1x daily - 7x weekly

## 2019-01-08 ENCOUNTER — Ambulatory Visit: Payer: 59

## 2019-01-08 ENCOUNTER — Other Ambulatory Visit: Payer: Self-pay

## 2019-01-08 DIAGNOSIS — R600 Localized edema: Secondary | ICD-10-CM | POA: Diagnosis not present

## 2019-01-08 DIAGNOSIS — M25662 Stiffness of left knee, not elsewhere classified: Secondary | ICD-10-CM | POA: Diagnosis not present

## 2019-01-08 DIAGNOSIS — M6281 Muscle weakness (generalized): Secondary | ICD-10-CM | POA: Diagnosis not present

## 2019-01-08 DIAGNOSIS — M25562 Pain in left knee: Secondary | ICD-10-CM | POA: Diagnosis not present

## 2019-01-08 DIAGNOSIS — R262 Difficulty in walking, not elsewhere classified: Secondary | ICD-10-CM

## 2019-01-08 NOTE — Therapy (Signed)
[facility] [address] [place], [place], [address] Phone: [phone]   Fax:  [phone]  Physical Therapy Treatment  Patient Details  Name: David Newman MRN: [id number] Date of Birth: April 01, 1961 Referring Provider (PT): Dr. Sydnee Cabal    Encounter Date: 01/08/2019  PT End of Session - 01/08/19 0927    Visit Number  2    Date for PT Re-Evaluation  03/02/19    PT Start Time  0846    PT Stop Time  0942    PT Time Calculation (min)  56 min    Activity Tolerance  Patient tolerated treatment well    Behavior During Therapy  [facility] for tasks assessed/performed       Past Medical History:  Diagnosis Date  . Anal fissure   . At risk for sleep apnea    STOP-BANG= 5    SENT TO PCP 03-27-2014  . CAD (coronary artery disease)   . Colon polyps    hyperplastic  . GERD (gastroesophageal reflux disease)   . Hyperlipidemia   . OA (osteoarthritis) of knee   . Right knee meniscal tear   . Sleep apnea    Wears CPAP.  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 Wears contact lenses   . Wears glasses     Past Surgical History:  Procedure Laterality Date  . COLONOSCOPY  04-24-2012  . FACIAL RECONSTRUCTION SURGERY  1973  age 27  . KNEE ARTHROSCOPY Right 03/29/2014   Procedure: RIGHT ARTHROSCOPY KNEE WITH DEBRIDMENT PARTIAL medial lateral MENISCECTOMY AND CHONDROPLASTY;  Surgeon: Sydnee Cabal, MD;  Location: [place];  Service: Orthopedics;  Laterality: Right;  . KNEE ARTHROSCOPY WITH ANTERIOR CRUCIATE LIGAMENT (ACL) REPAIR Right 1994  . KNEE ARTHROSCOPY WITH MEDIAL MENISECTOMY Left 12/28/2018   Procedure: KNEE ARTHROSCOPY WITH partial MEDIAL MENISECTOMY, debridement chondroplasty;  Surgeon: Sydnee Cabal, MD;  Location: [facility];  Service: Orthopedics;  Laterality: Left;  with knee block  . WISDOM TOOTH EXTRACTION  age 22    There were no vitals filed for this  visit.  Subjective Assessment - 01/08/19 0851    Subjective  I'm stiff and sore.    Currently in Pain?  Yes    Pain Score  2     Pain Location  Knee    Pain Orientation  Left    Pain Descriptors / Indicators  Sore    Pain Type  Surgical pain    Pain Onset  More than a month ago    Pain Frequency  Constant    Aggravating Factors   standing, walking, sitting too long    Pain Relieving Factors  ice, rest                       [facility] Adult PT Treatment/Exercise - 01/08/19 0001      Exercises   Exercises  Knee/Hip;Lumbar      Lumbar Exercises: Stretches   Active Hamstring Stretch  Left;3 reps;20 seconds      Knee/Hip Exercises: Aerobic   Nustep  Level 1 x 10 minutes    PT present to discuss progress     Knee/Hip Exercises: Standing   Heel Raises  Both;2 sets;10 reps    Forward Step Up  Left;2 sets;10 reps;Hand Hold: 2;Step Height: 6"    Rocker Board  3 minutes    Rebounder  weight shifting 3 ways: x1 minute each      Knee/Hip Exercises: Seated  Long Arc [employer] sets;10 reps      Knee/Hip Exercises: Supine   Straight Leg Raises  Strengthening;Left;2 sets;10 reps      Knee/Hip Exercises: Sidelying   Hip ABduction  Strengthening;Left;2 sets;10 reps      Knee/Hip Exercises: Prone   Hip Extension  Left;Strengthening;2 sets;10 reps      Modalities   Modalities  Vasopneumatic      Vasopneumatic   Number Minutes Vasopneumatic   15 minutes    Vasopnuematic Location   Knee    Vasopneumatic Pressure  Medium    Vasopneumatic Temperature   3 snowflakes               PT Short Term Goals - 01/05/19 1825      PT SHORT TERM GOAL #1   Title  Pt will demo consistency and independence with his initial HEP to improve ROM and muscle activation    Time  4    Period  Weeks    Status  New    Target Date  02/02/19      PT SHORT TERM GOAL #2   Title  The patient will be able to walk 10-15 minutes for short community ambulation for grocery shopping     Time  4    Period  Weeks    Status  New      PT SHORT TERM GOAL #3   Title  The patient will have improved left knee ROM 0-138 needed for greater ease getting in/out of the car    Time  4    Period  Weeks    Status  New      PT SHORT TERM GOAL #4   Title  The patient will have decreased edema left knee to within 1/2 cm to 1 cm difference to right    Time  4    Period  Weeks    Status  New        PT Long Term Goals - 01/05/19 1827      PT LONG TERM GOAL #1   Title  Pt will be independent with his advanced HEP to allow for continued progress and maintenance of his progress after d/c from PT.     Time  8    Period  Weeks    Status  New    Target Date  03/02/19      PT LONG TERM GOAL #2   Title  Pt will report at least 70% improvement in knee ROM, strength and mobility which will allow him to resume his prior recreational activity, household chores and work tasks    Time  8    Period  Weeks    Status  New      PT LONG TERM GOAL #3   Title  The patient will have 4/5 knee strength and 5-/5 hip strength needed for reciprocal stair climbing, curb negotiation and stooping    Time  8    Period  Weeks    Status  New      PT LONG TERM GOAL #4   Title  The patient will be able to walk 1 mile needed for general health and leisure activity    Time  8    Period  Weeks    Status  New      PT LONG TERM GOAL #5   Title  FOTO functional outcome score will improve from 57% limitation to 31% limitation    Time  8  Period  Weeks    Status  New            Plan - 01/08/19 0915    Clinical Impression Statement  Pt with first time follow-up after evaluation.  Pt is independent and compliant in HEP issued at evaluation.  Pt demonstrates mild quad lad upon initiating quad with long arc quads and SLR. Pt with quad fatigue with SLRs today.  Pt demonstrates mild antalgia with reduced knee extension with heel strike.   Pt required tactile cues for quad activation with mat hip exercises.   Pt will benefit from skilled PT to improve Lt hip and knee strength, flexibility and edema management.    PT Frequency  3x / week    PT Duration  8 weeks    PT Treatment/Interventions  ADLs/Self Care Home Management;Aquatic Therapy;Cryotherapy;Advice worker;Therapeutic activities;Therapeutic exercise;Iontophoresis 22m/ml Dexamethasone;Patient/family education;Manual techniques;Taping    PT Next Visit Plan  Left knee ROM;  quad activation;  bike or Nu-Step;  vasocompression    PT Home Exercise Plan  [email or web address]    Consulted and Agree with Plan of Care  Patient       Patient will benefit from skilled therapeutic intervention in order to improve the following deficits and impairments:  Abnormal gait, Difficulty walking, Decreased range of motion, Pain, Decreased strength, Increased edema, Decreased activity tolerance  Visit Diagnosis: Acute pain of left knee  Stiffness of left knee, not elsewhere classified  Muscle weakness (generalized)  Difficulty in walking, not elsewhere classified  Localized edema     Problem List Patient Active Problem List   Diagnosis Date Noted  . Morbid obesity ([place] 08/05/2017  . OSA (obstructive sleep apnea) 04/05/2017  . S/P right knee arthroscopy 03/29/2014    KSigurd Sos PT 01/08/19 9:29 AM  [facility] [address] [place] [address]Phone: [phone]  Fax:  [phone] Name: David GILMERMRN: [id number] of Birth: 702-22-1962

## 2019-01-10 ENCOUNTER — Ambulatory Visit: Payer: 59

## 2019-01-10 ENCOUNTER — Other Ambulatory Visit: Payer: Self-pay

## 2019-01-10 DIAGNOSIS — M6281 Muscle weakness (generalized): Secondary | ICD-10-CM

## 2019-01-10 DIAGNOSIS — R262 Difficulty in walking, not elsewhere classified: Secondary | ICD-10-CM | POA: Diagnosis not present

## 2019-01-10 DIAGNOSIS — M25562 Pain in left knee: Secondary | ICD-10-CM | POA: Diagnosis not present

## 2019-01-10 DIAGNOSIS — R600 Localized edema: Secondary | ICD-10-CM

## 2019-01-10 DIAGNOSIS — M25662 Stiffness of left knee, not elsewhere classified: Secondary | ICD-10-CM

## 2019-01-10 NOTE — Therapy (Signed)
[facility] [address] [place], [place], [address] Phone: [phone]   Fax:  [phone]  Physical Therapy Treatment  Patient Details  Name: David Newman MRN: [id number] Date of Birth: 07-Nov-1960 Referring Provider (PT): Dr. Sydnee Cabal    Encounter Date: 01/10/2019  PT End of Session - 01/10/19 0845    Visit Number  3    Date for PT Re-Evaluation  03/02/19    PT Start Time  0802    PT Stop Time  0859    PT Time Calculation (min)  57 min    Activity Tolerance  Patient tolerated treatment well    Behavior During Therapy  [facility] for tasks assessed/performed       Past Medical History:  Diagnosis Date  . Anal fissure   . At risk for sleep apnea    STOP-BANG= 5    SENT TO PCP 03-27-2014  . CAD (coronary artery disease)   . Colon polyps    hyperplastic  . GERD (gastroesophageal reflux disease)   . Hyperlipidemia   . OA (osteoarthritis) of knee   . Right knee meniscal tear   . Sleep apnea    Wears CPAP.  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 Wears contact lenses   . Wears glasses     Past Surgical History:  Procedure Laterality Date  . COLONOSCOPY  04-24-2012  . FACIAL RECONSTRUCTION SURGERY  1973  age 59  . KNEE ARTHROSCOPY Right 03/29/2014   Procedure: RIGHT ARTHROSCOPY KNEE WITH DEBRIDMENT PARTIAL medial lateral MENISCECTOMY AND CHONDROPLASTY;  Surgeon: Sydnee Cabal, MD;  Location: [place];  Service: Orthopedics;  Laterality: Right;  . KNEE ARTHROSCOPY WITH ANTERIOR CRUCIATE LIGAMENT (ACL) REPAIR Right 1994  . KNEE ARTHROSCOPY WITH MEDIAL MENISECTOMY Left 12/28/2018   Procedure: KNEE ARTHROSCOPY WITH partial MEDIAL MENISECTOMY, debridement chondroplasty;  Surgeon: Sydnee Cabal, MD;  Location: [facility];  Service: Orthopedics;  Laterality: Left;  with knee block  . WISDOM TOOTH EXTRACTION  age 47    There were no vitals filed for this  visit.  Subjective Assessment - 01/10/19 0825    Subjective  I'm working on my gait.    Pertinent History  right knee ACL and scope;  weakness from L5-S1 left LE had previous PT last Winter at [facility];  colitis;  patient notes preoperative muscle atrophy in thigh;  left knee scope 12/28/18    Currently in Pain?  Yes    Pain Score  1     Pain Location  Knee    Pain Orientation  Left    Pain Descriptors / Indicators  Sore                       [facility] Adult PT Treatment/Exercise - 01/10/19 0001      Lumbar Exercises: Stretches   Active Hamstring Stretch  Left;3 reps;20 seconds    Active Hamstring Stretch Limitations  using steps      Lumbar Exercises: Machines for Strengthening   Leg Press  seat 9, 90# bil 2x10, Lt only 60# 2x10      Knee/Hip Exercises: Aerobic   Nustep  Level 3  x 10 minutes    PT present to discuss progress     Knee/Hip Exercises: Standing   Heel Raises  Both;2 sets;10 reps    Forward Step Up  Left;2 sets;10 reps;Step Height: 6";Hand Hold: 1    Rocker Board  3 minutes  Knee/Hip Exercises: Seated   Long Arc Quad  Left;2 sets;10 reps      Modalities   Modalities  Vasopneumatic      Vasopneumatic   Number Minutes Vasopneumatic   15 minutes    Vasopnuematic Location   Knee    Vasopneumatic Pressure  Medium    Vasopneumatic Temperature   3 snowflakes               PT Short Term Goals - 01/10/19 0826      PT SHORT TERM GOAL #1   Title  Pt will demo consistency and independence with his initial HEP to improve ROM and muscle activation    Status  Achieved      PT SHORT TERM GOAL #2   Title  The patient will be able to walk 10-15 minutes for short community ambulation for grocery shopping        PT Long Term Goals - 01/05/19 1827      PT LONG TERM GOAL #1   Title  Pt will be independent with his advanced HEP to allow for continued progress and maintenance of his progress after d/c from PT.     Time  8    Period  Weeks    Status   New    Target Date  03/02/19      PT LONG TERM GOAL #2   Title  Pt will report at least 70% improvement in knee ROM, strength and mobility which will allow him to resume his prior recreational activity, household chores and work tasks    Time  8    Period  Weeks    Status  New      PT LONG TERM GOAL #3   Title  The patient will have 4/5 knee strength and 5-/5 hip strength needed for reciprocal stair climbing, curb negotiation and stooping    Time  8    Period  Weeks    Status  New      PT LONG TERM GOAL #4   Title  The patient will be able to walk 1 mile needed for general health and leisure activity    Time  8    Period  Weeks    Status  New      PT LONG TERM GOAL #5   Title  FOTO functional outcome score will improve from 57% limitation to 31% limitation    Time  8    Period  Weeks    Status  New            Plan - 01/10/19 4481    Clinical Impression Statement  Pt is making good progress s/p Lt knee scope surgery.  Pt demonstrates mild antalgia on level surface due to stiffness and limited Lt knee extension at heel strike.  Pt is able to walk 5 minutes without limitation and has not tried walking any farther.  Pt requires UE support with step-ups today and verbal and tactile cues for technique.  Pt will continue to benefit from skilled PT for Lt knee strength, flexibility and edema management.    Rehab Potential  Good    PT Frequency  3x / week    PT Duration  8 weeks    PT Treatment/Interventions  ADLs/Self Care Home Management;Aquatic Therapy;Cryotherapy;Advice worker;Therapeutic activities;Therapeutic exercise;Iontophoresis 62m/ml Dexamethasone;Patient/family education;Manual techniques;Taping    PT Next Visit Plan  Left knee ROM;  quad activation;  bike or Nu-Step;  vasocompression    PT Home Exercise Plan  [email or web address]    Consulted and Agree with Plan of Care  Patient       Patient will benefit from skilled therapeutic intervention in order to  improve the following deficits and impairments:  Abnormal gait, Difficulty walking, Decreased range of motion, Pain, Decreased strength, Increased edema, Decreased activity tolerance  Visit Diagnosis: Acute pain of left knee  Muscle weakness (generalized)  Stiffness of left knee, not elsewhere classified  Difficulty in walking, not elsewhere classified  Localized edema     Problem List Patient Active Problem List   Diagnosis Date Noted  . Morbid obesity ([place]) 08/05/2017  . OSA (obstructive sleep apnea) 04/05/2017  . S/P right knee arthroscopy 03/29/2014     Sigurd Sos, PT 01/10/19 8:46 AM  [facility] [address] [place], [place], [address] Phone: [phone]   Fax:  [phone]  Name: David Newman MRN: [id number] Date of Birth: [birth year]

## 2019-01-12 ENCOUNTER — Ambulatory Visit: Payer: 59 | Admitting: Physical Therapy

## 2019-01-12 ENCOUNTER — Encounter: Payer: Self-pay | Admitting: Physical Therapy

## 2019-01-12 ENCOUNTER — Other Ambulatory Visit: Payer: Self-pay

## 2019-01-12 DIAGNOSIS — M25662 Stiffness of left knee, not elsewhere classified: Secondary | ICD-10-CM

## 2019-01-12 DIAGNOSIS — R262 Difficulty in walking, not elsewhere classified: Secondary | ICD-10-CM | POA: Diagnosis not present

## 2019-01-12 DIAGNOSIS — M6281 Muscle weakness (generalized): Secondary | ICD-10-CM | POA: Diagnosis not present

## 2019-01-12 DIAGNOSIS — R600 Localized edema: Secondary | ICD-10-CM

## 2019-01-12 DIAGNOSIS — M25562 Pain in left knee: Secondary | ICD-10-CM | POA: Diagnosis not present

## 2019-01-12 NOTE — Therapy (Signed)
[facility] [address], [place], [place], [address] Phone: [phone]   Fax:  [phone]  Physical Therapy Treatment  Patient Details  Name: David Newman MRN: [id number] Date of Birth: [birth year] Referring Provider (PT): Dr. Sydnee Cabal    Encounter Date: 01/12/2019  PT End of Session - 01/12/19 0840    Visit Number  4    Date for PT Re-Evaluation  03/02/19    PT Start Time  0800    PT Stop Time  3846    PT Time Calculation (min)  53 min    Activity Tolerance  Patient tolerated treatment well;No increased pain    Behavior During Therapy  [facility] for tasks assessed/performed       Past Medical History:  Diagnosis Date  . Anal fissure   . At risk for sleep apnea    STOP-BANG= 5    SENT TO PCP 03-27-2014  . CAD (coronary artery disease)   . Colon polyps    hyperplastic  . GERD (gastroesophageal reflux disease)   . Hyperlipidemia   . OA (osteoarthritis) of knee   . Right knee meniscal tear   . Sleep apnea    Wears CPAP.  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 Wears contact lenses   . Wears glasses     Past Surgical History:  Procedure Laterality Date  . COLONOSCOPY  04-24-2012  . FACIAL RECONSTRUCTION SURGERY  1973  age 70  . KNEE ARTHROSCOPY Right 03/29/2014   Procedure: RIGHT ARTHROSCOPY KNEE WITH DEBRIDMENT PARTIAL medial lateral MENISCECTOMY AND CHONDROPLASTY;  Surgeon: Sydnee Cabal, MD;  Location: [place];  Service: Orthopedics;  Laterality: Right;  . KNEE ARTHROSCOPY WITH ANTERIOR CRUCIATE LIGAMENT (ACL) REPAIR Right 1994  . KNEE ARTHROSCOPY WITH MEDIAL MENISECTOMY Left 12/28/2018   Procedure: KNEE ARTHROSCOPY WITH partial MEDIAL MENISECTOMY, debridement chondroplasty;  Surgeon: Sydnee Cabal, MD;  Location: [facility];  Service: Orthopedics;  Laterality: Left;  with knee block  . WISDOM TOOTH EXTRACTION  age 52    There were no vitals filed for this  visit.  Subjective Assessment - 01/12/19 0802    Subjective  Pt states things are going well. He has some soreness but nothing too bad.    Pertinent History  right knee ACL and scope;  weakness from L5-S1 left LE had [facility] PT [facility] at [facility];  colitis;  patient notes preoperative muscle atrophy in thigh;  left knee scope 12/28/18    Currently in Pain?  Yes    Pain Score  2     Pain Location  Knee    Pain Orientation  Right    Pain Descriptors / Indicators  Sore    Pain Type  Surgical pain    Pain Radiating Towards  none    Pain Onset  1 to 4 weeks ago    Pain Frequency  Intermittent    Aggravating Factors   walking                       [facility] Adult PT Treatment/Exercise - 01/12/19 0001      Lumbar Exercises: Machines for Strengthening   Leg Press  Seat 6 #110 2x10 reps BLE 2x10; Seat 7 Lt #60 2x10 reps       Knee/Hip Exercises: Standing   Heel Raises  Both;2 sets;10 reps    Heel Raises Limitations  single leg x10 reps     Terminal Knee Extension  Strengthening;1 set;20 reps;Left    Theraband Level (Terminal Knee Extension)  Level 2 (Red)    Forward Step Up  Left;2 sets;10 reps;Step Height: 6";Hand Hold: 1      Knee/Hip Exercises: Seated   Hamstring Curl  Left;2 sets;15 reps    Hamstring Limitations  green TB      Knee/Hip Exercises: Supine   Quad Sets  Left;1 set;15 reps      Vasopneumatic   Number Minutes Vasopneumatic   15 minutes    Vasopnuematic Location   Knee    Vasopneumatic Pressure  Medium    Vasopneumatic Temperature   34      Manual Therapy   Manual therapy comments  Lt tibial external rotation mobilization during quad sets              PT Education - 01/12/19 0840    Education Details  technique with therex    Person(s) Educated  Patient    Methods  Explanation;Verbal cues    Comprehension  Verbalized understanding;Returned demonstration       PT Short Term Goals - 01/10/19 0826      PT SHORT TERM GOAL #1   Title  Pt will  demo consistency and independence with his initial HEP to improve ROM and muscle activation    Status  Achieved      PT SHORT TERM GOAL #2   Title  The patient will be able to walk 10-15 minutes for short community ambulation for grocery shopping        PT Long Term Goals - 01/05/19 1827      PT LONG TERM GOAL #1   Title  Pt will be independent with his advanced HEP to allow for continued progress and maintenance of his progress after d/c from PT.     Time  8    Period  Weeks    Status  New    Target Date  03/02/19      PT LONG TERM GOAL #2   Title  Pt will report at least 70% improvement in knee ROM, strength and mobility which will allow him to resume his prior recreational activity, household chores and work tasks    Time  8    Period  Weeks    Status  New      PT LONG TERM GOAL #3   Title  The patient will have 4/5 knee strength and 5-/5 hip strength needed for reciprocal stair climbing, curb negotiation and stooping    Time  8    Period  Weeks    Status  New      PT LONG TERM GOAL #4   Title  The patient will be able to walk 1 mile needed for general health and leisure activity    Time  8    Period  Weeks    Status  New      PT LONG TERM GOAL #5   Title  FOTO functional outcome score will improve from 57% limitation to 31% limitation    Time  8    Period  Weeks    Status  New            Plan - 01/12/19 8101    Clinical Impression Statement  Pt is doing well, reporting minimal pain with daily activity that is mostly unchanged with his exercises. He was able to progress resistance with leg press without difficulty. He has near full end range terminal extension and quadriceps strength was further  addressed during this session. Pt is currently coming 3x/week but could easily drop to 2x/week if he continues to progress as well as he is. Will continue with current POC.    Rehab Potential  Good    PT Frequency  3x / week    PT Duration  8 weeks    PT  Treatment/Interventions  ADLs/Self Care Home Management;Aquatic Therapy;Cryotherapy;Advice worker;Therapeutic activities;Therapeutic exercise;Iontophoresis 8m/ml Dexamethasone;Patient/family education;Manual techniques;Taping    PT Next Visit Plan  gait training, knee proprioception, vasocompression, quad/hamstring strength    PT Home Exercise Plan  [email or web address]    Consulted and Agree with Plan of Care  Patient       Patient will benefit from skilled therapeutic intervention in order to improve the following deficits and impairments:  Abnormal gait, Difficulty walking, Decreased range of motion, Pain, Decreased strength, Increased edema, Decreased activity tolerance  Visit Diagnosis: Acute pain of left knee  Muscle weakness (generalized)  Stiffness of left knee, not elsewhere classified  Difficulty in walking, not elsewhere classified  Localized edema     Problem List Patient Active Problem List   Diagnosis Date Noted  . Morbid obesity ([place] 08/05/2017  . OSA (obstructive sleep apnea) 04/05/2017  . S/P right knee arthroscopy 03/29/2014    8:49 AM,01/12/19 SSherol DadePT, DPT [facility] [address] [place] [address]Phone: [phone]  Fax:  [phone] Name: David DEGROFFMRN: [id number] of Birth: 706-06-1960

## 2019-01-15 ENCOUNTER — Ambulatory Visit: Payer: 59

## 2019-01-15 ENCOUNTER — Other Ambulatory Visit: Payer: Self-pay

## 2019-01-15 DIAGNOSIS — M6281 Muscle weakness (generalized): Secondary | ICD-10-CM | POA: Diagnosis not present

## 2019-01-15 DIAGNOSIS — M25662 Stiffness of left knee, not elsewhere classified: Secondary | ICD-10-CM | POA: Diagnosis not present

## 2019-01-15 DIAGNOSIS — R262 Difficulty in walking, not elsewhere classified: Secondary | ICD-10-CM

## 2019-01-15 DIAGNOSIS — R600 Localized edema: Secondary | ICD-10-CM

## 2019-01-15 DIAGNOSIS — M25562 Pain in left knee: Secondary | ICD-10-CM | POA: Diagnosis not present

## 2019-01-15 NOTE — Therapy (Signed)
[facility] [address], [place], [place], [address] Phone: [phone]   Fax:  [phone]  Physical Therapy Treatment  Patient Details  Name: David Newman MRN: [id number] Date of Birth: 21-Aug-1960 Referring Provider (PT): Dr. Sydnee Cabal    Encounter Date: 01/15/2019  PT End of Session - 01/15/19 0842    Visit Number  5    Date for PT Re-Evaluation  03/02/19    PT Start Time  0800    PT Stop Time  8016    PT Time Calculation (min)  54 min    Activity Tolerance  Patient tolerated treatment well;No increased pain    Behavior During Therapy  WFL for tasks assessed/performed       Past Medical History:  Diagnosis Date  . Anal fissure   . At risk for sleep apnea    STOP-BANG= 5    SENT TO PCP 03-27-2014  . CAD (coronary artery disease)   . Colon polyps    hyperplastic  . GERD (gastroesophageal reflux disease)   . Hyperlipidemia   . OA (osteoarthritis) of knee   . Right knee meniscal tear   . Sleep apnea    Wears CPAP.  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 Wears contact lenses   . Wears glasses     Past Surgical History:  Procedure Laterality Date  . COLONOSCOPY  04-24-2012  . FACIAL RECONSTRUCTION SURGERY  1973  age 74  . KNEE ARTHROSCOPY Right 03/29/2014   Procedure: RIGHT ARTHROSCOPY KNEE WITH DEBRIDMENT PARTIAL medial lateral MENISCECTOMY AND CHONDROPLASTY;  Surgeon: Sydnee Cabal, MD;  Location: [facility];  Service: Orthopedics;  Laterality: Right;  . KNEE ARTHROSCOPY WITH ANTERIOR CRUCIATE LIGAMENT (ACL) REPAIR Right 1994  . KNEE ARTHROSCOPY WITH MEDIAL MENISECTOMY Left 12/28/2018   Procedure: KNEE ARTHROSCOPY WITH partial MEDIAL MENISECTOMY, debridement chondroplasty;  Surgeon: Sydnee Cabal, MD;  Location: [facility];  Service: Orthopedics;  Laterality: Left;  with knee block  . WISDOM TOOTH EXTRACTION  age 40    There were no vitals filed for this  visit.  Subjective Assessment - 01/15/19 0803    Subjective  I didn't kill myself this weekend so my knee is feeling good.    Pertinent History  right knee ACL and scope;  weakness from L5-S1 left LE had previous PT last Winter at [facility];  colitis;  patient notes preoperative muscle atrophy in thigh;  left knee scope 12/28/18    Patient Stated Goals  Get back to where I was;  walk 1-2 miles a day; ride a bike; some level of exercise, push mower; return to trout fishing; kayak    Currently in Pain?  Yes    Pain Score  1     Pain Location  Knee    Pain Orientation  Left    Pain Descriptors / Indicators  Sore    Pain Type  Surgical pain    Pain Onset  1 to 4 weeks ago    Pain Frequency  Intermittent    Aggravating Factors   being on the knee too much    Pain Relieving Factors  ice, rest         [facility] PT Assessment - 01/15/19 0001      Circumferential Edema   Circumferential - Right  16.5 inches    Circumferential - Left   17 inches      AROM   Left Knee Flexion  131  [place] Adult PT Treatment/Exercise - 01/15/19 0001      Lumbar Exercises: Stretches   Active Hamstring Stretch  Left;3 reps;20 seconds    Active Hamstring Stretch Limitations  using steps      Lumbar Exercises: Machines for Strengthening   Leg Press  Seat 6 110# 1x10, 125# 1x10 BLE 2x10; Seat 7 Lt 75# 2x10 reps       Knee/Hip Exercises: Aerobic   Stationary Bike  Level 4 x 8 minutes   PT present to discuss progress     Knee/Hip Exercises: Standing   Forward Step Up  Left;2 sets;10 reps;Step Height: 6";Hand Hold: 1    Rocker Board  3 minutes    SLS  10 seconds x 5    Rebounder  weight shifting 3 ways: x1 minute each      Knee/Hip Exercises: Seated   Sit to Sand  2 sets;10 reps;without UE support   holding 10# weight     Modalities   Modalities  Vasopneumatic      Vasopneumatic   Number Minutes Vasopneumatic   15 minutes    Vasopnuematic Location   Knee    Vasopneumatic  Pressure  Medium    Vasopneumatic Temperature   3 snowflakes               PT Short Term Goals - 01/15/19 0804      PT SHORT TERM GOAL #1   Title  Pt will demo consistency and independence with his initial HEP to improve ROM and muscle activation    Status  Achieved      PT SHORT TERM GOAL #2   Title  The patient will be able to walk 10-15 minutes for short community ambulation for grocery shopping    Baseline  able to walk 30 minutes in the grocery store    Status  Achieved        PT Long Term Goals - 01/05/19 1827      PT LONG TERM GOAL #1   Title  Pt will be independent with his advanced HEP to allow for continued progress and maintenance of his progress after d/c from PT.     Time  8    Period  Weeks    Status  New    Target Date  03/02/19      PT LONG TERM GOAL #2   Title  Pt will report at least 70% improvement in knee ROM, strength and mobility which will allow him to resume his prior recreational activity, household chores and work tasks    Time  8    Period  Weeks    Status  New      PT LONG TERM GOAL #3   Title  The patient will have 4/5 knee strength and 5-/5 hip strength needed for reciprocal stair climbing, curb negotiation and stooping    Time  8    Period  Weeks    Status  New      PT LONG TERM GOAL #4   Title  The patient will be able to walk 1 mile needed for general health and leisure activity    Time  8    Period  Weeks    Status  New      PT LONG TERM GOAL #5   Title  FOTO functional outcome score will improve from 57% limitation to 31% limitation    Time  8    Period  Weeks    Status  New  Plan - 01/15/19 0818    Clinical Impression Statement  Pt is making steady gains s/p Lt knee scope.  Pt is able to walk for 30 minutes in the grocery store without limitation.  Pt demonstrates mild antalgia on level surfaces today.  Pt demonstrates improved stability of the Lt LE on unlevel surfaces.  Pt with improved ROM and reduced  edema.  Pt requires minor verbal cues for quad activation.  Pt was able to tolerate increased resistance with leg press this week.  Pt will continue to benefit from skilled PT for strength, proprioception, ROM and edema management.    PT Frequency  3x / week    PT Duration  8 weeks    PT Treatment/Interventions  ADLs/Self Care Home Management;Aquatic Therapy;Cryotherapy;Advice worker;Therapeutic activities;Therapeutic exercise;Iontophoresis 48m/ml Dexamethasone;Patient/family education;Manual techniques;Taping    PT Next Visit Plan  gait training, knee proprioception, vasocompression, quad/hamstring strength    PT Home Exercise Plan  [email or web address]    Consulted and Agree with Plan of Care  Patient       Patient will benefit from skilled therapeutic intervention in order to improve the following deficits and impairments:  Abnormal gait, Difficulty walking, Decreased range of motion, Pain, Decreased strength, Increased edema, Decreased activity tolerance  Visit Diagnosis: Acute pain of left knee  Muscle weakness (generalized)  Stiffness of left knee, not elsewhere classified  Difficulty in walking, not elsewhere classified  Localized edema     Problem List Patient Active Problem List   Diagnosis Date Noted  . Morbid obesity ([place] 08/05/2017  . OSA (obstructive sleep apnea) 04/05/2017  . S/P right knee arthroscopy 03/29/2014   KSigurd Sos PT 01/15/19 8:44 AM  [facility] [address] [place] [address]Phone: [phone]  Fax:  [phone] Name: David VELTREMRN: [id number] of Birth: 705/11/1960

## 2019-01-17 ENCOUNTER — Ambulatory Visit: Payer: 59 | Attending: Specialist

## 2019-01-17 ENCOUNTER — Other Ambulatory Visit: Payer: Self-pay

## 2019-01-17 DIAGNOSIS — R262 Difficulty in walking, not elsewhere classified: Secondary | ICD-10-CM | POA: Diagnosis not present

## 2019-01-17 DIAGNOSIS — R600 Localized edema: Secondary | ICD-10-CM | POA: Diagnosis not present

## 2019-01-17 DIAGNOSIS — M25662 Stiffness of left knee, not elsewhere classified: Secondary | ICD-10-CM | POA: Diagnosis not present

## 2019-01-17 DIAGNOSIS — M6281 Muscle weakness (generalized): Secondary | ICD-10-CM | POA: Insufficient documentation

## 2019-01-17 DIAGNOSIS — M25562 Pain in left knee: Secondary | ICD-10-CM | POA: Insufficient documentation

## 2019-01-17 NOTE — Therapy (Signed)
[facility] [address] [place], [place], [address] Phone: [phone]   Fax:  [phone]  Physical Therapy Treatment  Patient Details  Name: David Newman MRN: [id number] Date of Birth: August 04, 1960 Referring Provider (PT): Dr. Sydnee Cabal    Encounter Date: 01/17/2019  PT End of Session - 01/17/19 0931    Visit Number  6    Date for PT Re-Evaluation  03/02/19    PT Start Time  0845    PT Stop Time  8921    PT Time Calculation (min)  59 min    Activity Tolerance  Patient tolerated treatment well;No increased pain    Behavior During Therapy  WFL for tasks assessed/performed       Past Medical History:  Diagnosis Date  . Anal fissure   . At risk for sleep apnea    STOP-BANG= 5    SENT TO PCP 03-27-2014  . CAD (coronary artery disease)   . Colon polyps    hyperplastic  . GERD (gastroesophageal reflux disease)   . Hyperlipidemia   . OA (osteoarthritis) of knee   . Right knee meniscal tear   . Sleep apnea    Wears CPAP.  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 Wears contact lenses   . Wears glasses     Past Surgical History:  Procedure Laterality Date  . COLONOSCOPY  04-24-2012  . FACIAL RECONSTRUCTION SURGERY  1973  age 46  . KNEE ARTHROSCOPY Right 03/29/2014   Procedure: RIGHT ARTHROSCOPY KNEE WITH DEBRIDMENT PARTIAL medial lateral MENISCECTOMY AND CHONDROPLASTY;  Surgeon: Sydnee Cabal, MD;  Location: [facility];  Service: Orthopedics;  Laterality: Right;  . KNEE ARTHROSCOPY WITH ANTERIOR CRUCIATE LIGAMENT (ACL) REPAIR Right 1994  . KNEE ARTHROSCOPY WITH MEDIAL MENISECTOMY Left 12/28/2018   Procedure: KNEE ARTHROSCOPY WITH partial MEDIAL MENISECTOMY, debridement chondroplasty;  Surgeon: Sydnee Cabal, MD;  Location: [facility];  Service: Orthopedics;  Laterality: Left;  with knee block  . WISDOM TOOTH EXTRACTION  age 5    There were no vitals filed for this  visit.  Subjective Assessment - 01/17/19 0852    Subjective  I am doing well.  working on normalizing gait.    Currently in Pain?  Yes    Pain Score  1     Pain Location  Knee    Pain Orientation  Left                       [facility] Adult PT Treatment/Exercise - 01/17/19 0001      Lumbar Exercises: Stretches   Active Hamstring Stretch  Left;3 reps;20 seconds    Active Hamstring Stretch Limitations  using steps      Lumbar Exercises: Machines for Strengthening   Leg Press  Seat 6 125# 2 x10 BLE 2x10; Seat 7 Lt 75# 2x10 reps       Knee/Hip Exercises: Aerobic   Nustep  Level 3  x 10 minutes    PT present to discuss progress     Knee/Hip Exercises: Standing   Step Down  Left;2 sets;10 reps;Hand Hold: 2;Step Height: 6"    Rocker Board  3 minutes    Rebounder  weight shifting 3 ways: x1 minute each    Walking with Sports Cord  25# forward and reversex10 each. emphasis on heel strike      Knee/Hip Exercises: Seated   Sit to Sand  2 sets;10 reps;without UE support  holding 10# weight     Modalities   Modalities  Vasopneumatic      Vasopneumatic   Number Minutes Vasopneumatic   15 minutes    Vasopnuematic Location   Knee    Vasopneumatic Pressure  Medium    Vasopneumatic Temperature   3 snowflakes               PT Short Term Goals - 01/17/19 0859      PT SHORT TERM GOAL #1   Title  Pt will demo consistency and independence with his initial HEP to improve ROM and muscle activation    Status  Achieved      PT SHORT TERM GOAL #2   Title  The patient will be able to walk 10-15 minutes for short community ambulation for grocery shopping    Baseline  able to walk 30 minutes in the grocery store    Status  Achieved      PT Pisek #3   Title  The patient will have improved left knee ROM 0-138 needed for greater ease getting in/out of the car    Baseline  131 degrees    Time  4    Period  Weeks    Status  On-going      PT SHORT TERM GOAL #4    Title  The patient will have decreased edema left knee to within 1/2 cm to 1 cm difference to right    Baseline  1/2 inch difference    Time  4    Period  Weeks    Status  On-going        PT Long Term Goals - 01/05/19 1827      PT LONG TERM GOAL #1   Title  Pt will be independent with his advanced HEP to allow for continued progress and maintenance of his progress after d/c from PT.     Time  8    Period  Weeks    Status  New    Target Date  03/02/19      PT LONG TERM GOAL #2   Title  Pt will report at least 70% improvement in knee ROM, strength and mobility which will allow him to resume his prior recreational activity, household chores and work tasks    Time  8    Period  Weeks    Status  New      PT LONG TERM GOAL #3   Title  The patient will have 4/5 knee strength and 5-/5 hip strength needed for reciprocal stair climbing, curb negotiation and stooping    Time  8    Period  Weeks    Status  New      PT LONG TERM GOAL #4   Title  The patient will be able to walk 1 mile needed for general health and leisure activity    Time  8    Period  Weeks    Status  New      PT LONG TERM GOAL #5   Title  FOTO functional outcome score will improve from 57% limitation to 31% limitation    Time  8    Period  Weeks    Status  New            Plan - 01/17/19 0909    Clinical Impression Statement  Pt is making steady progress s/p Lt knee scope.  Pt was able to advance strength and proprioception of the Lt knee with  improved ability to stabilize with the Lt hip and knee.  Pt with 131 degrees of Lt knee flexion and edema is  inch greater on the Lt knee.  Pt with reduced eccentric control on the Lt LE and required verbal cues for hip alignment.  Pt will continue to benefit from skilled PT for Lt LE strength, proprioception and edema management.    PT Frequency  3x / week    PT Duration  8 weeks    PT Treatment/Interventions  ADLs/Self Care Home Management;Aquatic  Therapy;Cryotherapy;Advice worker;Therapeutic activities;Therapeutic exercise;Iontophoresis 33m/ml Dexamethasone;Patient/family education;Manual techniques;Taping    PT Next Visit Plan  gait training, knee proprioception, vasocompression, quad/hamstring strength    PT Home Exercise Plan  [email or web address]    Consulted and Agree with Plan of Care  Patient       Patient will benefit from skilled therapeutic intervention in order to improve the following deficits and impairments:  Abnormal gait, Difficulty walking, Decreased range of motion, Pain, Decreased strength, Increased edema, Decreased activity tolerance  Visit Diagnosis: Acute pain of left knee  Muscle weakness (generalized)  Stiffness of left knee, not elsewhere classified  Difficulty in walking, not elsewhere classified  Localized edema     Problem List Patient Active Problem List   Diagnosis Date Noted  . Morbid obesity ([place] 08/05/2017  . OSA (obstructive sleep apnea) 04/05/2017  . S/P right knee arthroscopy 03/29/2014     KSigurd Sos PT 01/17/19 9:33 AM  [facility] [address] [place] [address]Phone: [phone]  Fax:  [phone] Name: David COPELANMRN: [id number] of Birth: 7September 30, 1962

## 2019-01-19 ENCOUNTER — Ambulatory Visit: Payer: 59 | Admitting: Physical Therapy

## 2019-01-19 ENCOUNTER — Encounter: Payer: Self-pay | Admitting: Physical Therapy

## 2019-01-19 ENCOUNTER — Other Ambulatory Visit: Payer: Self-pay

## 2019-01-19 DIAGNOSIS — R262 Difficulty in walking, not elsewhere classified: Secondary | ICD-10-CM | POA: Diagnosis not present

## 2019-01-19 DIAGNOSIS — M25562 Pain in left knee: Secondary | ICD-10-CM | POA: Diagnosis not present

## 2019-01-19 DIAGNOSIS — M6281 Muscle weakness (generalized): Secondary | ICD-10-CM | POA: Diagnosis not present

## 2019-01-19 DIAGNOSIS — R600 Localized edema: Secondary | ICD-10-CM

## 2019-01-19 DIAGNOSIS — M25662 Stiffness of left knee, not elsewhere classified: Secondary | ICD-10-CM | POA: Diagnosis not present

## 2019-01-19 NOTE — Therapy (Signed)
[facility] [address] [place], [place], [address] Phone: [phone]   Fax:  [phone]  Physical Therapy Treatment  Patient Details  Name: David Newman MRN: [id number] Date of Birth: 09-27-1960 Referring Provider (PT): Dr. Sydnee Cabal    Encounter Date: 01/19/2019  PT End of Session - 01/19/19 0845    Visit Number  7    Date for PT Re-Evaluation  03/02/19    PT Start Time  0813    PT Stop Time  0910    PT Time Calculation (min)  57 min    Activity Tolerance  Patient tolerated treatment well       Past Medical History:  Diagnosis Date  . Anal fissure   . At risk for sleep apnea    STOP-BANG= 5    SENT TO PCP 03-27-2014  . CAD (coronary artery disease)   . Colon polyps    hyperplastic  . GERD (gastroesophageal reflux disease)   . Hyperlipidemia   . OA (osteoarthritis) of knee   . Right knee meniscal tear   . Sleep apnea    Wears CPAP.  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 Wears contact lenses   . Wears glasses     Past Surgical History:  Procedure Laterality Date  . COLONOSCOPY  04-24-2012  . FACIAL RECONSTRUCTION SURGERY  1973  age 74  . KNEE ARTHROSCOPY Right 03/29/2014   Procedure: RIGHT ARTHROSCOPY KNEE WITH DEBRIDMENT PARTIAL medial lateral MENISCECTOMY AND CHONDROPLASTY;  Surgeon: Sydnee Cabal, MD;  Location: [facility];  Service: Orthopedics;  Laterality: Right;  . KNEE ARTHROSCOPY WITH ANTERIOR CRUCIATE LIGAMENT (ACL) REPAIR Right 1994  . KNEE ARTHROSCOPY WITH MEDIAL MENISECTOMY Left 12/28/2018   Procedure: KNEE ARTHROSCOPY WITH partial MEDIAL MENISECTOMY, debridement chondroplasty;  Surgeon: Sydnee Cabal, MD;  Location: [facility];  Service: Orthopedics;  Laterality: Left;  with knee block  . WISDOM TOOTH EXTRACTION  age 31    There were no vitals filed for this visit.  Subjective Assessment - 01/19/19 0816    Subjective  Reports swelling  increase by the end of the day.  Limp with first few steps upon rising.    Pertinent History  right knee ACL and scope;  weakness from L5-S1 left LE had [facility] PT [facility] at [facility];  colitis;  patient notes preoperative muscle atrophy in thigh;  left knee scope 12/28/18    Currently in Pain?  Yes    Pain Score  1     Pain Location  Knee    Pain Orientation  Left         [facility] PT Assessment - 01/19/19 0001      AROM   Left Knee Extension  2   sitting 6   Left Knee Flexion  144                   [facility] Adult PT Treatment/Exercise - 01/19/19 0001      Neuro Re-ed    Neuro Re-ed Details   focus on quad activation with proprioceptive ex single limb support       Knee/Hip Exercises: Stretches   Active Hamstring Stretch Limitations  2nd step HS stretch     Other Knee/Hip Stretches  2nd step stretch for flexion       Knee/Hip Exercises: Aerobic   Nustep  Level 3  x 10 minutes    PT present to discuss progress     Knee/Hip Exercises:  Standing   Forward Step Up  Left;20 reps;Hand Hold: 0;Step Height: 6"    Step Down  Left;15 reps;Hand Hold: 0;Step Height: 4"    SLS with Vectors  10x WB on left     Walking with Sports Cord  25# forward and reversex10 each. emphasis on heel strike    Other Standing Knee Exercises  standing TKEs green band 20x       Vasopneumatic   Number Minutes Vasopneumatic   15 minutes    Vasopnuematic Location   Knee    Vasopneumatic Pressure  Medium    Vasopneumatic Temperature   3 snowflakes               PT Short Term Goals - 01/17/19 0859      PT SHORT TERM GOAL #1   Title  Pt will demo consistency and independence with his initial HEP to improve ROM and muscle activation    Status  Achieved      PT SHORT TERM GOAL #2   Title  The patient will be able to walk 10-15 minutes for short community ambulation for grocery shopping    Baseline  able to walk 30 minutes in the grocery store    Status  Achieved      PT SHORT TERM GOAL #3    Title  The patient will have improved left knee ROM 0-138 needed for greater ease getting in/out of the car    Baseline  131 degrees    Time  4    Period  Weeks    Status  On-going      PT SHORT TERM GOAL #4   Title  The patient will have decreased edema left knee to within 1/2 cm to 1 cm difference to right    Baseline  1/2 inch difference    Time  4    Period  Weeks    Status  On-going        PT Long Term Goals - 01/05/19 1827      PT LONG TERM GOAL #1   Title  Pt will be independent with his advanced HEP to allow for continued progress and maintenance of his progress after d/c from PT.     Time  8    Period  Weeks    Status  New    Target Date  03/02/19      PT LONG TERM GOAL #2   Title  Pt will report at least 70% improvement in knee ROM, strength and mobility which will allow him to resume his prior recreational activity, household chores and work tasks    Time  8    Period  Weeks    Status  New      PT LONG TERM GOAL #3   Title  The patient will have 4/5 knee strength and 5-/5 hip strength needed for reciprocal stair climbing, curb negotiation and stooping    Time  8    Period  Weeks    Status  New      PT LONG TERM GOAL #4   Title  The patient will be able to walk 1 mile needed for general health and leisure activity    Time  8    Period  Weeks    Status  New      PT LONG TERM GOAL #5   Title  FOTO functional outcome score will improve from 57% limitation to 31% limitation    Time  8  Period  Weeks    Status  New            Plan - 01/19/19 0901    Clinical Impression Statement  The patient has much improved knee ROM especially for flexion.  His terminal knee extension ROM  is limited by anterior knee edema which inhibits quad muscle activation.  Decreased proprioception with SLS requiring frequent UE use to stabilize.  Periodic limp particularly with first few steps upon rising from the chair.  Good reduction in edema following vasocompression.   Verbal cues for patellofemoral alignment needed.    Comorbidities  history of right knee pathology;  history LBP    Examination-Activity Limitations  Locomotion Level;Lift;Stairs;Squat;Carry;Stand    Examination-Participation Restrictions  Meal Prep;Cleaning;Community Activity    Stability/Clinical Decision Making  Stable/Uncomplicated    Rehab Potential  Good    PT Frequency  3x / week    PT Duration  8 weeks    PT Treatment/Interventions  ADLs/Self Care Home Management;Aquatic Therapy;Cryotherapy;Advice worker;Therapeutic activities;Therapeutic exercise;Iontophoresis 91m/ml Dexamethasone;Patient/family education;Manual techniques;Taping    PT Next Visit Plan  gait training, knee proprioception, vasocompression, quad/hamstring strength    PT Home Exercise Plan  [place]      Patient will benefit from skilled therapeutic intervention in order to improve the following deficits and impairments:  Abnormal gait, Difficulty walking, Decreased range of motion, Pain, Decreased strength, Increased edema, Decreased activity tolerance  Visit Diagnosis: Acute pain of left knee  Muscle weakness (generalized)  Stiffness of left knee, not elsewhere classified  Difficulty in walking, not elsewhere classified  Localized edema     Problem List Patient Active Problem List   Diagnosis Date Noted  . Morbid obesity ([place] 08/05/2017  . OSA (obstructive sleep apnea) 04/05/2017  . S/P right knee arthroscopy 03/29/2014   SRuben Im PT 01/19/19 10:31 AM Phone: [phone]: [phone] Singh9/08/2018, 10:30 AM  [facility] [address] [place] [address]Phone: [phone]  Fax:  [phone] Name: MJENSEN KILBURGMRN: [id number] of Birth: 12/18/1960/11/09

## 2019-01-23 ENCOUNTER — Other Ambulatory Visit: Payer: Self-pay

## 2019-01-23 ENCOUNTER — Ambulatory Visit: Payer: 59

## 2019-01-23 DIAGNOSIS — M6281 Muscle weakness (generalized): Secondary | ICD-10-CM | POA: Diagnosis not present

## 2019-01-23 DIAGNOSIS — M25562 Pain in left knee: Secondary | ICD-10-CM | POA: Diagnosis not present

## 2019-01-23 DIAGNOSIS — M25662 Stiffness of left knee, not elsewhere classified: Secondary | ICD-10-CM

## 2019-01-23 DIAGNOSIS — R262 Difficulty in walking, not elsewhere classified: Secondary | ICD-10-CM

## 2019-01-23 DIAGNOSIS — R600 Localized edema: Secondary | ICD-10-CM | POA: Diagnosis not present

## 2019-01-23 NOTE — Therapy (Signed)
[facility] [address], [place], [place], [address] Phone: [phone]   Fax:  [phone]  Physical Therapy Treatment  Patient Details  Name: BRONISLAUS VERDELL MRN: [id number] Date of Birth: 11/21/1960 Referring Provider (PT): Dr. Sydnee Cabal    Encounter Date: 01/23/2019  PT End of Session - 01/23/19 0813    Visit Number  8    Date for PT Re-Evaluation  03/02/19    PT Start Time  0730    PT Stop Time  0828    PT Time Calculation (min)  58 min    Activity Tolerance  Patient tolerated treatment well    Behavior During Therapy  [facility] for tasks assessed/performed       Past Medical History:  Diagnosis Date  . Anal fissure   . At risk for sleep apnea    STOP-BANG= 5    SENT TO PCP 03-27-2014  . CAD (coronary artery disease)   . Colon polyps    hyperplastic  . GERD (gastroesophageal reflux disease)   . Hyperlipidemia   . OA (osteoarthritis) of knee   . Right knee meniscal tear   . Sleep apnea    Wears CPAP.  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 Wears contact lenses   . Wears glasses     Past Surgical History:  Procedure Laterality Date  . COLONOSCOPY  04-24-2012  . FACIAL RECONSTRUCTION SURGERY  1973  age 51  . KNEE ARTHROSCOPY Right 03/29/2014   Procedure: RIGHT ARTHROSCOPY KNEE WITH DEBRIDMENT PARTIAL medial lateral MENISCECTOMY AND CHONDROPLASTY;  Surgeon: Sydnee Cabal, MD;  Location: [place];  Service: Orthopedics;  Laterality: Right;  . KNEE ARTHROSCOPY WITH ANTERIOR CRUCIATE LIGAMENT (ACL) REPAIR Right 1994  . KNEE ARTHROSCOPY WITH MEDIAL MENISECTOMY Left 12/28/2018   Procedure: KNEE ARTHROSCOPY WITH partial MEDIAL MENISECTOMY, debridement chondroplasty;  Surgeon: Sydnee Cabal, MD;  Location: [facility];  Service: Orthopedics;  Laterality: Left;  with knee block  . WISDOM TOOTH EXTRACTION  age 76    There were no vitals filed for this  visit.  Subjective Assessment - 01/23/19 0741    Subjective  I didn't do a lot over the weekend.    Currently in Pain?  Yes    Pain Score  1     Pain Location  Knee    Pain Orientation  Left    Pain Descriptors / Indicators  Sore    Pain Type  Surgical pain    Pain Onset  More than a month ago    Pain Frequency  Intermittent    Aggravating Factors   standing too long    Pain Relieving Factors  ice, rest, stretching                       [facility] Adult PT Treatment/Exercise - 01/23/19 0001      Lumbar Exercises: Stretches   Active Hamstring Stretch  Left;3 reps;20 seconds    Active Hamstring Stretch Limitations  using steps      Lumbar Exercises: Machines for Strengthening   Leg Press  Seat 6 125# 2 x10 BLE 2x10; Seat 7 Lt 75# 2x10 reps       Knee/Hip Exercises: Stretches   Active Hamstring Stretch Limitations  2nd step HS stretch     Other Knee/Hip Stretches  2nd step stretch for flexion       Knee/Hip Exercises: Aerobic   Nustep  Level 3  x 10 minutes  PT present to discuss progress     Knee/Hip Exercises: Standing   Forward Step Up  Left;20 reps;Hand Hold: 0;Step Height: 6"    Step Down  Left;15 reps;Hand Hold: 0;Step Height: 4"    Rocker Board  3 minutes    SLS with Vectors  10x WB on left     Walking with Sports Cord  25# forward and reversex10 each. emphasis on heel strike      Knee/Hip Exercises: Seated   Sit to Sand  2 sets;10 reps;without UE support   holding 10# weight     Vasopneumatic   Number Minutes Vasopneumatic   15 minutes    Vasopnuematic Location   Knee    Vasopneumatic Pressure  Medium    Vasopneumatic Temperature   3 snowflakes               PT Short Term Goals - 01/17/19 0859      PT SHORT TERM GOAL #1   Title  Pt will demo consistency and independence with his initial HEP to improve ROM and muscle activation    Status  Achieved      PT SHORT TERM GOAL #2   Title  The patient will be able to walk 10-15 minutes for  short community ambulation for grocery shopping    Baseline  able to walk 30 minutes in the grocery store    Status  Achieved      PT [place] #3   Title  The patient will have improved left knee ROM 0-138 needed for greater ease getting in/out of the car    Baseline  131 degrees    Time  4    Period  Weeks    Status  On-going      PT SHORT TERM GOAL #4   Title  The patient will have decreased edema left knee to within 1/2 cm to 1 cm difference to right    Baseline  1/2 inch difference    Time  4    Period  Weeks    Status  On-going        PT Long Term Goals - 01/05/19 1827      PT LONG TERM GOAL #1   Title  Pt will be independent with his advanced HEP to allow for continued progress and maintenance of his progress after d/c from PT.     Time  8    Period  Weeks    Status  New    Target Date  03/02/19      PT LONG TERM GOAL #2   Title  Pt will report at least 70% improvement in knee ROM, strength and mobility which will allow him to resume his prior recreational activity, household chores and work tasks    Time  8    Period  Weeks    Status  New      PT LONG TERM GOAL #3   Title  The patient will have 4/5 knee strength and 5-/5 hip strength needed for reciprocal stair climbing, curb negotiation and stooping    Time  8    Period  Weeks    Status  New      PT LONG TERM GOAL #4   Title  The patient will be able to walk 1 mile needed for general health and leisure activity    Time  8    Period  Weeks    Status  New      PT  LONG TERM GOAL #5   Title  FOTO functional outcome score will improve from 57% limitation to 31% limitation    Time  8    Period  Weeks    Status  New            Plan - 01/23/19 0741    Clinical Impression Statement  Pt demonstrates improved Lt knee A/ROM s/p Lt knee scope. Pt demonstrates decreased proprioception with SLS requiring frequent UE use to stabilize. Pt with improved eccentric control of Lt quad with step-down exercise.   Pt with improved gait pattern although minimal antalgia with first steps upon standing.  Good reduction in edema following vasocompression.  Pt will continue to benefit from skilled PT for Lt knee strength, proprioception and edema management to allow for return to prior level of function following Lt knee scope.    PT Frequency  3x / week    PT Duration  8 weeks    PT Treatment/Interventions  ADLs/Self Care Home Management;Aquatic Therapy;Cryotherapy;Advice worker;Therapeutic activities;Therapeutic exercise;Iontophoresis 72m/ml Dexamethasone;Patient/family education;Manual techniques;Taping    PT Next Visit Plan  gait training, knee proprioception, vasocompression, quad/hamstring strength    PT Home Exercise Plan  [email or web address]    Consulted and Agree with Plan of Care  Patient       Patient will benefit from skilled therapeutic intervention in order to improve the following deficits and impairments:  Abnormal gait, Difficulty walking, Decreased range of motion, Pain, Decreased strength, Increased edema, Decreased activity tolerance  Visit Diagnosis: Acute pain of left knee  Muscle weakness (generalized)  Stiffness of left knee, not elsewhere classified  Difficulty in walking, not elsewhere classified  Localized edema     Problem List Patient Active Problem List   Diagnosis Date Noted  . Morbid obesity ([place] 08/05/2017  . OSA (obstructive sleep apnea) 04/05/2017  . S/P right knee arthroscopy 03/29/2014   KSigurd Sos PT 01/23/19 8:15 AM   [facility] [address] [place] [address]Phone: [phone]  Fax:  [phone] Name: MWILVER TIGNORMRN: [id number] of Birth: 06/09/1960-04-25

## 2019-01-25 ENCOUNTER — Other Ambulatory Visit: Payer: Self-pay

## 2019-01-25 ENCOUNTER — Ambulatory Visit: Payer: 59

## 2019-01-25 DIAGNOSIS — M25662 Stiffness of left knee, not elsewhere classified: Secondary | ICD-10-CM | POA: Diagnosis not present

## 2019-01-25 DIAGNOSIS — R600 Localized edema: Secondary | ICD-10-CM

## 2019-01-25 DIAGNOSIS — M25562 Pain in left knee: Secondary | ICD-10-CM | POA: Diagnosis not present

## 2019-01-25 DIAGNOSIS — M6281 Muscle weakness (generalized): Secondary | ICD-10-CM | POA: Diagnosis not present

## 2019-01-25 DIAGNOSIS — R262 Difficulty in walking, not elsewhere classified: Secondary | ICD-10-CM | POA: Diagnosis not present

## 2019-01-25 NOTE — Therapy (Signed)
[facility] [address], [place], [place], [address] Phone: [phone]   Fax:  [phone]  Physical Therapy Treatment  Patient Details  Name: David Newman MRN: [id number] Date of Birth: 1960-10-05 Referring Provider (PT): Dr. Sydnee Cabal    Encounter Date: 01/25/2019  PT End of Session - 01/25/19 0812    Visit Number  9    Date for PT Re-Evaluation  03/02/19    PT Start Time  0730    PT Stop Time  0822    PT Time Calculation (min)  52 min    Activity Tolerance  Patient tolerated treatment well    Behavior During Therapy  [facility] for tasks assessed/performed       Past Medical History:  Diagnosis Date  . Anal fissure   . At risk for sleep apnea    STOP-BANG= 5    SENT TO PCP 03-27-2014  . CAD (coronary artery disease)   . Colon polyps    hyperplastic  . GERD (gastroesophageal reflux disease)   . Hyperlipidemia   . OA (osteoarthritis) of knee   . Right knee meniscal tear   . Sleep apnea    Wears CPAP.  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 Wears contact lenses   . Wears glasses     Past Surgical History:  Procedure Laterality Date  . COLONOSCOPY  04-24-2012  . FACIAL RECONSTRUCTION SURGERY  1973  age 39  . KNEE ARTHROSCOPY Right 03/29/2014   Procedure: RIGHT ARTHROSCOPY KNEE WITH DEBRIDMENT PARTIAL medial lateral MENISCECTOMY AND CHONDROPLASTY;  Surgeon: Sydnee Cabal, MD;  Location: [place];  Service: Orthopedics;  Laterality: Right;  . KNEE ARTHROSCOPY WITH ANTERIOR CRUCIATE LIGAMENT (ACL) REPAIR Right 1994  . KNEE ARTHROSCOPY WITH MEDIAL MENISECTOMY Left 12/28/2018   Procedure: KNEE ARTHROSCOPY WITH partial MEDIAL MENISECTOMY, debridement chondroplasty;  Surgeon: Sydnee Cabal, MD;  Location: [facility];  Service: Orthopedics;  Laterality: Left;  with knee block  . WISDOM TOOTH EXTRACTION  age 24    There were no vitals filed for this  visit.  Subjective Assessment - 01/25/19 0733    Subjective  I was very busy at work yesterday so I didn't do a lot of activity.    Currently in Pain?  Yes    Pain Score  0-No pain    Pain Location  Knee    Pain Descriptors / Indicators  Sore    Pain Type  Surgical pain                       [facility] Adult PT Treatment/Exercise - 01/25/19 0001      Lumbar Exercises: Stretches   Active Hamstring Stretch  Left;3 reps;20 seconds    Active Hamstring Stretch Limitations  using steps      Lumbar Exercises: Machines for Strengthening   Leg Press  Seat 6 125# 2 x10 BLE 2x10; Seat 7 Lt 75# 2x10 reps       Knee/Hip Exercises: Stretches   Other Knee/Hip Stretches  2nd step stretch for flexion       Knee/Hip Exercises: Aerobic   Stationary Bike  Level 4 x 10 minutes   verbal cues for quad activation     Knee/Hip Exercises: Standing   Step Down  Left;Hand Hold: 0;2 sets;10 reps;Step Height: 6"    Rocker Board  3 minutes    SLS  on green pad 5x 10 seconds    Walking with Sports  Cord  30# forward and reversex10 each. emphasis on heel strike      Knee/Hip Exercises: Seated   Sit to Sand  2 sets;10 reps;without UE support   holding 10# weight     Vasopneumatic   Number Minutes Vasopneumatic   10 minutes    Vasopnuematic Location   Knee    Vasopneumatic Pressure  Medium    Vasopneumatic Temperature   3 snowflakes               PT Short Term Goals - 01/17/19 0859      PT SHORT TERM GOAL #1   Title  Pt will demo consistency and independence with his initial HEP to improve ROM and muscle activation    Status  Achieved      PT SHORT TERM GOAL #2   Title  The patient will be able to walk 10-15 minutes for short community ambulation for grocery shopping    Baseline  able to walk 30 minutes in the grocery store    Status  Achieved      PT [place] #3   Title  The patient will have improved left knee ROM 0-138 needed for greater ease getting in/out of the  car    Baseline  131 degrees    Time  4    Period  Weeks    Status  On-going      PT SHORT TERM GOAL #4   Title  The patient will have decreased edema left knee to within 1/2 cm to 1 cm difference to right    Baseline  1/2 inch difference    Time  4    Period  Weeks    Status  On-going        PT Long Term Goals - 01/05/19 1827      PT LONG TERM GOAL #1   Title  Pt will be independent with his advanced HEP to allow for continued progress and maintenance of his progress after d/c from PT.     Time  8    Period  Weeks    Status  New    Target Date  03/02/19      PT LONG TERM GOAL #2   Title  Pt will report at least 70% improvement in knee ROM, strength and mobility which will allow him to resume his prior recreational activity, household chores and work tasks    Time  8    Period  Weeks    Status  New      PT LONG TERM GOAL #3   Title  The patient will have 4/5 knee strength and 5-/5 hip strength needed for reciprocal stair climbing, curb negotiation and stooping    Time  8    Period  Weeks    Status  New      PT LONG TERM GOAL #4   Title  The patient will be able to walk 1 mile needed for general health and leisure activity    Time  8    Period  Weeks    Status  New      PT LONG TERM GOAL #5   Title  FOTO functional outcome score will improve from 57% limitation to 31% limitation    Time  8    Period  Weeks    Status  New            Plan - 01/25/19 0806    Clinical Impression Statement  Pt is  making good progress s/p Lt knee scope.  Pt with improved heel strike with gait and demonstrates minimal antalgia.  Pt with moderate quad stability with single leg stance and is able to stabilize without UE support.  Pt requires minor verbal cues for alignment with step downs.  Pt will continue to benefit from skilled PT for Lt knee strength, flexibility, proprioception and edema management.    PT Frequency  3x / week    PT Duration  8 weeks    PT Treatment/Interventions   ADLs/Self Care Home Management;Aquatic Therapy;Cryotherapy;Advice worker;Therapeutic activities;Therapeutic exercise;Iontophoresis 74m/ml Dexamethasone;Patient/family education;Manual techniques;Taping    PT Next Visit Plan  gait training, knee proprioception, vasocompression, quad/hamstring strength    PT Home Exercise Plan  [email or web address]    Consulted and Agree with Plan of Care  Patient       Patient will benefit from skilled therapeutic intervention in order to improve the following deficits and impairments:  Abnormal gait, Difficulty walking, Decreased range of motion, Pain, Decreased strength, Increased edema, Decreased activity tolerance  Visit Diagnosis: Acute pain of left knee  Muscle weakness (generalized)  Stiffness of left knee, not elsewhere classified  Difficulty in walking, not elsewhere classified  Localized edema     Problem List Patient Active Problem List   Diagnosis Date Noted  . Morbid obesity ([place] 08/05/2017  . OSA (obstructive sleep apnea) 04/05/2017  . S/P right knee arthroscopy 03/29/2014    KSigurd Sos PT 01/25/19 8:15 AM  [facility] [address] [place] [address]Phone: [phone]  Fax:  [phone] Name: David PAYETTEMRN: [id number] of Birth: 7Jul 31, 1962

## 2019-01-30 ENCOUNTER — Ambulatory Visit: Payer: 59

## 2019-01-30 ENCOUNTER — Other Ambulatory Visit: Payer: Self-pay

## 2019-01-30 DIAGNOSIS — M6281 Muscle weakness (generalized): Secondary | ICD-10-CM | POA: Diagnosis not present

## 2019-01-30 DIAGNOSIS — M25662 Stiffness of left knee, not elsewhere classified: Secondary | ICD-10-CM | POA: Diagnosis not present

## 2019-01-30 DIAGNOSIS — R600 Localized edema: Secondary | ICD-10-CM

## 2019-01-30 DIAGNOSIS — M25562 Pain in left knee: Secondary | ICD-10-CM

## 2019-01-30 DIAGNOSIS — R262 Difficulty in walking, not elsewhere classified: Secondary | ICD-10-CM | POA: Diagnosis not present

## 2019-01-30 NOTE — Therapy (Signed)
[facility] [address] [place], [place], [address] Phone: [phone]   Fax:  [phone]  Physical Therapy Treatment  Patient Details  Name: David Newman MRN: [id number] Date of Birth: 1961-04-11 Referring Provider (PT): Dr. Sydnee Cabal    Encounter Date: 01/30/2019  PT End of Session - 01/30/19 0809    Visit Number  10    Date for PT Re-Evaluation  03/02/19    PT Start Time  0730    PT Stop Time  0831    PT Time Calculation (min)  61 min    Activity Tolerance  Patient tolerated treatment well    Behavior During Therapy  [facility] for tasks assessed/performed       Past Medical History:  Diagnosis Date  . Anal fissure   . At risk for sleep apnea    STOP-BANG= 5    SENT TO PCP 03-27-2014  . CAD (coronary artery disease)   . Colon polyps    hyperplastic  . GERD (gastroesophageal reflux disease)   . Hyperlipidemia   . OA (osteoarthritis) of knee   . Right knee meniscal tear   . Sleep apnea    Wears CPAP.  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 Wears contact lenses   . Wears glasses     Past Surgical History:  Procedure Laterality Date  . COLONOSCOPY  04-24-2012  . FACIAL RECONSTRUCTION SURGERY  1973  age 12  . KNEE ARTHROSCOPY Right 03/29/2014   Procedure: RIGHT ARTHROSCOPY KNEE WITH DEBRIDMENT PARTIAL medial lateral MENISCECTOMY AND CHONDROPLASTY;  Surgeon: Sydnee Cabal, MD;  Location: [place];  Service: Orthopedics;  Laterality: Right;  . KNEE ARTHROSCOPY WITH ANTERIOR CRUCIATE LIGAMENT (ACL) REPAIR Right 1994  . KNEE ARTHROSCOPY WITH MEDIAL MENISECTOMY Left 12/28/2018   Procedure: KNEE ARTHROSCOPY WITH partial MEDIAL MENISECTOMY, debridement chondroplasty;  Surgeon: Sydnee Cabal, MD;  Location: [facility];  Service: Orthopedics;  Laterality: Left;  with knee block  . WISDOM TOOTH EXTRACTION  age 79    There were no vitals filed for this  visit.  Subjective Assessment - 01/30/19 0732    Subjective  I'm doing OK.  I took it easy on my knee yesterday.    Currently in Pain?  No/denies    Pain Score  --   up to 5/10 after activity   Pain Location  Knee    Pain Orientation  Left    Pain Descriptors / Indicators  Sore         [facility] PT Assessment - 01/30/19 0001      Observation/Other Assessments   Focus on Therapeutic Outcomes (FOTO)   43% limitation                   [facility] Adult PT Treatment/Exercise - 01/30/19 0001      Lumbar Exercises: Stretches   Active Hamstring Stretch  Left;3 reps;20 seconds    Active Hamstring Stretch Limitations  using steps      Lumbar Exercises: Machines for Strengthening   Leg Press  Seat 6 125# 2 x10 BLE 2x10 with ball squeeze; Seat 7 Lt 75# 2x10 reps       Knee/Hip Exercises: Stretches   Other Knee/Hip Stretches  2nd step stretch for flexion       Knee/Hip Exercises: Aerobic   Nustep  Level 4  x 10 minutes    PT present to discuss progress     Knee/Hip Exercises: Land  3 minutes    SLS  on green pad 5x 10 seconds    Walking with Sports Cord  30# forward and reversex10 each. emphasis on heel strike, 20# sidestepping x 5 minutes       Knee/Hip Exercises: Seated   Sit to Sand  2 sets;10 reps;without UE support   holding 10# weight     Vasopneumatic   Number Minutes Vasopneumatic   15 minutes    Vasopnuematic Location   Knee    Vasopneumatic Pressure  Medium    Vasopneumatic Temperature   3 snowflakes               PT Short Term Goals - 01/17/19 0859      PT SHORT TERM GOAL #1   Title  Pt will demo consistency and independence with his initial HEP to improve ROM and muscle activation    Status  Achieved      PT SHORT TERM GOAL #2   Title  The patient will be able to walk 10-15 minutes for short community ambulation for grocery shopping    Baseline  able to walk 30 minutes in the grocery store    Status  Achieved      PT [place] #3   Title  The patient will have improved left knee ROM 0-138 needed for greater ease getting in/out of the car    Baseline  131 degrees    Time  4    Period  Weeks    Status  On-going      PT SHORT TERM GOAL #4   Title  The patient will have decreased edema left knee to within 1/2 cm to 1 cm difference to right    Baseline  1/2 inch difference    Time  4    Period  Weeks    Status  On-going        PT Long Term Goals - 01/30/19 0735      PT LONG TERM GOAL #1   Title  Pt will be independent with his advanced HEP to allow for continued progress and maintenance of his progress after d/c from PT.     Baseline  --    Time  8    Period  Weeks    Status  On-going      PT LONG TERM GOAL #2   Title  Pt will report at least 70% improvement in knee ROM, strength and mobility which will allow him to resume his prior recreational activity, household chores and work tasks    Baseline  50% improvement    Period  Weeks    Status  On-going      PT LONG TERM GOAL #4   Title  The patient will be able to walk 1 mile needed for general health and leisure activity      PT LONG TERM GOAL #5   Title  FOTO functional outcome score will improve from 57% limitation to 31% limitation    Baseline  43% limitation    Time  8    Period  Weeks    Status  On-going            Plan - 01/30/19 0808    Clinical Impression Statement  Pt is making steady progress s/p Lt knee scope.  Pt with improved distance for community ambulation up to 30 minutes and reports up to 5/10 Lt knee pain after increased activity.  FOTO is improved to 43% limitation.  Pt required minor verbal cues for quad activation with proprioceptive activity.  Pt will continue to benefit from skilled PT for Lt knee strength, proprioception and edema management.    PT Frequency  3x / week    PT Duration  8 weeks    PT Treatment/Interventions  ADLs/Self Care Home Management;Aquatic Therapy;Cryotherapy;Engineer, water;Therapeutic activities;Therapeutic exercise;Iontophoresis 45m/ml Dexamethasone;Patient/family education;Manual techniques;Taping    PT Next Visit Plan  gait training, knee proprioception, vasocompression, quad/hamstring strength, edema management    PT Home Exercise Plan  [email or web address]    Consulted and Agree with Plan of Care  Patient       Patient will benefit from skilled therapeutic intervention in order to improve the following deficits and impairments:  Abnormal gait, Difficulty walking, Decreased range of motion, Pain, Decreased strength, Increased edema, Decreased activity tolerance  Visit Diagnosis: Muscle weakness (generalized)  Acute pain of left knee  Stiffness of left knee, not elsewhere classified  Difficulty in walking, not elsewhere classified  Localized edema     Problem List Patient Active Problem List   Diagnosis Date Noted  . Morbid obesity ([place] 08/05/2017  . OSA (obstructive sleep apnea) 04/05/2017  . S/P right knee arthroscopy 03/29/2014    KSigurd Sos PT 01/30/19 8:13 AM   [facility] [address] [place] [address]Phone: [phone]  Fax:  [phone] Name: David PERUSKIMRN: [id number] of Birth: [birth year]

## 2019-02-01 ENCOUNTER — Other Ambulatory Visit: Payer: Self-pay

## 2019-02-01 ENCOUNTER — Ambulatory Visit: Payer: 59

## 2019-02-01 DIAGNOSIS — M25562 Pain in left knee: Secondary | ICD-10-CM | POA: Diagnosis not present

## 2019-02-01 DIAGNOSIS — M25662 Stiffness of left knee, not elsewhere classified: Secondary | ICD-10-CM

## 2019-02-01 DIAGNOSIS — M6281 Muscle weakness (generalized): Secondary | ICD-10-CM | POA: Diagnosis not present

## 2019-02-01 DIAGNOSIS — R600 Localized edema: Secondary | ICD-10-CM | POA: Diagnosis not present

## 2019-02-01 DIAGNOSIS — R262 Difficulty in walking, not elsewhere classified: Secondary | ICD-10-CM | POA: Diagnosis not present

## 2019-02-01 NOTE — Therapy (Signed)
[facility] [address] [place], [place], [address] Phone: [phone]   Fax:  [phone]  Physical Therapy Treatment  Patient Details  Name: David Newman MRN: [id number] Date of Birth: October 31, 1960 Referring Provider (PT): Dr. Sydnee Cabal    Encounter Date: 02/01/2019  PT End of Session - 02/01/19 0755    Visit Number  11    Date for PT Re-Evaluation  03/02/19    PT Start Time  0730    PT Stop Time  0828    PT Time Calculation (min)  58 min    Activity Tolerance  Patient tolerated treatment well    Behavior During Therapy  [facility] for tasks assessed/performed       Past Medical History:  Diagnosis Date  . Anal fissure   . At risk for sleep apnea    STOP-BANG= 5    SENT TO PCP 03-27-2014  . CAD (coronary artery disease)   . Colon polyps    hyperplastic  . GERD (gastroesophageal reflux disease)   . Hyperlipidemia   . OA (osteoarthritis) of knee   . Right knee meniscal tear   . Sleep apnea    Wears CPAP.  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 Wears contact lenses   . Wears glasses     Past Surgical History:  Procedure Laterality Date  . COLONOSCOPY  04-24-2012  . FACIAL RECONSTRUCTION SURGERY  1973  age 69  . KNEE ARTHROSCOPY Right 03/29/2014   Procedure: RIGHT ARTHROSCOPY KNEE WITH DEBRIDMENT PARTIAL medial lateral MENISCECTOMY AND CHONDROPLASTY;  Surgeon: Sydnee Cabal, MD;  Location: [place];  Service: Orthopedics;  Laterality: Right;  . KNEE ARTHROSCOPY WITH ANTERIOR CRUCIATE LIGAMENT (ACL) REPAIR Right 1994  . KNEE ARTHROSCOPY WITH MEDIAL MENISECTOMY Left 12/28/2018   Procedure: KNEE ARTHROSCOPY WITH partial MEDIAL MENISECTOMY, debridement chondroplasty;  Surgeon: Sydnee Cabal, MD;  Location: [facility];  Service: Orthopedics;  Laterality: Left;  with knee block  . WISDOM TOOTH EXTRACTION  age 26    There were no vitals filed for this  visit.  Subjective Assessment - 02/01/19 0731    Subjective  I felt good after last session.    Pertinent History  right knee ACL and scope;  weakness from L5-S1 left LE had previous PT last Winter at [facility];  colitis;  patient notes preoperative muscle atrophy in thigh;  left knee scope 12/28/18                       [facility] Adult PT Treatment/Exercise - 02/01/19 0001      Lumbar Exercises: Stretches   Active Hamstring Stretch  Left;3 reps;20 seconds    Active Hamstring Stretch Limitations  using steps      Lumbar Exercises: Machines for Strengthening   Leg Press  Seat 6 125# 2 x10 BLE 2x10 with ball squeeze; Seat 7 Lt 75# 2x10 reps       Knee/Hip Exercises: Stretches   Other Knee/Hip Stretches  2nd step stretch for flexion       Knee/Hip Exercises: Aerobic   Nustep  Level 4  x 10 minutes    PT present to discuss progress     Knee/Hip Exercises: Standing   Forward Step Up  Left;20 reps;Hand Hold: 0;Step Height: 6"    Forward Step Up Limitations  on Bosu    Step Down  Left;Hand Hold: 0;2 sets;10 reps;Step Height: 6"    Rocker Board  3 minutes  SLS  on green pad 5x 10 seconds    Walking with Sports Cord  30# forward and reversex10 each. emphasis on heel strike, 20# sidestepping x 5 minutes       Knee/Hip Exercises: Seated   Sit to Sand  2 sets;10 reps;without UE support   holding 10# weight     Vasopneumatic   Number Minutes Vasopneumatic   15 minutes    Vasopnuematic Location   Knee    Vasopneumatic Pressure  Medium    Vasopneumatic Temperature   3 snowflakes               PT Short Term Goals - 01/17/19 0859      PT SHORT TERM GOAL #1   Title  Pt will demo consistency and independence with his initial HEP to improve ROM and muscle activation    Status  Achieved      PT SHORT TERM GOAL #2   Title  The patient will be able to walk 10-15 minutes for short community ambulation for grocery shopping    Baseline  able to walk 30 minutes in the grocery  store    Status  Achieved      PT [place] #3   Title  The patient will have improved left knee ROM 0-138 needed for greater ease getting in/out of the car    Baseline  131 degrees    Time  4    Period  Weeks    Status  On-going      PT SHORT TERM GOAL #4   Title  The patient will have decreased edema left knee to within 1/2 cm to 1 cm difference to right    Baseline  1/2 inch difference    Time  4    Period  Weeks    Status  On-going        PT Long Term Goals - 01/30/19 0735      PT LONG TERM GOAL #1   Title  Pt will be independent with his advanced HEP to allow for continued progress and maintenance of his progress after d/c from PT.     Baseline  --    Time  8    Period  Weeks    Status  On-going      PT LONG TERM GOAL #2   Title  Pt will report at least 70% improvement in knee ROM, strength and mobility which will allow him to resume his prior recreational activity, household chores and work tasks    Baseline  50% improvement    Period  Weeks    Status  On-going      PT LONG TERM GOAL #4   Title  The patient will be able to walk 1 mile needed for general health and leisure activity      PT LONG TERM GOAL #5   Title  FOTO functional outcome score will improve from 57% limitation to 31% limitation    Baseline  43% limitation    Time  8    Period  Weeks    Status  On-going            Plan - 02/01/19 0738    Clinical Impression Statement  Pt continues to work on strength of the Lt knee.  Pt with improve FOTO score this week and is able to walk longer.  Pt with reduced eccentric control with step-downs and required minor verbal cues for alignment and control.  Pt with edema  after long periods of standing and is using ice and elevation to control.  Pt will continue to benefit from skilled PT for Lt knee strength, proprioception and edema management.    PT Frequency  3x / week    PT Duration  8 weeks    PT Treatment/Interventions  ADLs/Self Care Home  Management;Aquatic Therapy;Cryotherapy;Advice worker;Therapeutic activities;Therapeutic exercise;Iontophoresis 67m/ml Dexamethasone;Patient/family education;Manual techniques;Taping    PT Next Visit Plan  gait training, knee proprioception, vasocompression, quad/hamstring strength, edema management    PT Home Exercise Plan  [email or web address]    Consulted and Agree with Plan of Care  Patient       Patient will benefit from skilled therapeutic intervention in order to improve the following deficits and impairments:  Abnormal gait, Difficulty walking, Decreased range of motion, Pain, Decreased strength, Increased edema, Decreased activity tolerance  Visit Diagnosis: Muscle weakness (generalized)  Acute pain of left knee  Stiffness of left knee, not elsewhere classified  Difficulty in walking, not elsewhere classified  Localized edema     Problem List Patient Active Problem List   Diagnosis Date Noted  . Morbid obesity ([place] 08/05/2017  . OSA (obstructive sleep apnea) 04/05/2017  . S/P right knee arthroscopy 03/29/2014    KSigurd Sos PT 02/01/19 8:14 AM  [facility] [address] [place] [address]Phone: [phone]  Fax:  [phone] Name: David TODOROVMRN: [id number] of Birth: 706-18-1962

## 2019-02-05 DIAGNOSIS — Z0000 Encounter for general adult medical examination without abnormal findings: Secondary | ICD-10-CM | POA: Diagnosis not present

## 2019-02-06 ENCOUNTER — Encounter: Payer: 59 | Admitting: Physical Therapy

## 2019-02-06 ENCOUNTER — Other Ambulatory Visit: Payer: Self-pay

## 2019-02-06 ENCOUNTER — Ambulatory Visit: Payer: 59

## 2019-02-06 DIAGNOSIS — R262 Difficulty in walking, not elsewhere classified: Secondary | ICD-10-CM

## 2019-02-06 DIAGNOSIS — M6281 Muscle weakness (generalized): Secondary | ICD-10-CM | POA: Diagnosis not present

## 2019-02-06 DIAGNOSIS — M25662 Stiffness of left knee, not elsewhere classified: Secondary | ICD-10-CM

## 2019-02-06 DIAGNOSIS — R600 Localized edema: Secondary | ICD-10-CM

## 2019-02-06 DIAGNOSIS — M25562 Pain in left knee: Secondary | ICD-10-CM

## 2019-02-06 NOTE — Therapy (Signed)
[facility] [address] [place], [place], [address] Phone: [phone]   Fax:  [phone]  Physical Therapy Treatment  Patient Details  Name: David Newman MRN: [id number] Date of Birth: 06-Jan-1961 Referring Provider (PT): Dr. Sydnee Cabal    Encounter Date: 02/06/2019  PT End of Session - 02/06/19 0815    Visit Number  12    Date for PT Re-Evaluation  03/02/19    PT Start Time  0730    PT Stop Time  0829    PT Time Calculation (min)  59 min    Activity Tolerance  Patient tolerated treatment well    Behavior During Therapy  [facility] for tasks assessed/performed       Past Medical History:  Diagnosis Date  . Anal fissure   . At risk for sleep apnea    STOP-BANG= 5    SENT TO PCP 03-27-2014  . CAD (coronary artery disease)   . Colon polyps    hyperplastic  . GERD (gastroesophageal reflux disease)   . Hyperlipidemia   . OA (osteoarthritis) of knee   . Right knee meniscal tear   . Sleep apnea    Wears CPAP.  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 Wears contact lenses   . Wears glasses     Past Surgical History:  Procedure Laterality Date  . COLONOSCOPY  04-24-2012  . FACIAL RECONSTRUCTION SURGERY  1973  age 58  . KNEE ARTHROSCOPY Right 03/29/2014   Procedure: RIGHT ARTHROSCOPY KNEE WITH DEBRIDMENT PARTIAL medial lateral MENISCECTOMY AND CHONDROPLASTY;  Surgeon: Sydnee Cabal, MD;  Location: [place];  Service: Orthopedics;  Laterality: Right;  . KNEE ARTHROSCOPY WITH ANTERIOR CRUCIATE LIGAMENT (ACL) REPAIR Right 1994  . KNEE ARTHROSCOPY WITH MEDIAL MENISECTOMY Left 12/28/2018   Procedure: KNEE ARTHROSCOPY WITH partial MEDIAL MENISECTOMY, debridement chondroplasty;  Surgeon: Sydnee Cabal, MD;  Location: [facility];  Service: Orthopedics;  Laterality: Left;  with knee block  . WISDOM TOOTH EXTRACTION  age 58    There were no vitals filed for this  visit.  Subjective Assessment - 02/06/19 0732    Subjective  I am walking better.  I am still achy/sore in the Lt knee.    Currently in Pain?  No/denies                       [facility] Adult PT Treatment/Exercise - 02/06/19 0001      Lumbar Exercises: Stretches   Active Hamstring Stretch  Left;3 reps;20 seconds    Active Hamstring Stretch Limitations  using steps      Lumbar Exercises: Machines for Strengthening   Leg Press  Seat 6 125# 2 x10 BLE 2x10 with ball squeeze; Seat 7 Lt 75# 2x10 reps       Knee/Hip Exercises: Aerobic   Stationary Bike  Level 4 x 10 minutes   PT present for status update     Knee/Hip Exercises: Standing   Forward Step Up  --    Forward Step Up Limitations  --    Step Down  Left;Hand Hold: 0;2 sets;10 reps;Step Height: 6"    Rocker Board  3 minutes    SLS  on blue pad 5x 10 seconds    Walking with Sports Cord  30# forward and reversex10 each. emphasis on heel strike, 30# sidestepping x 5 reps    Other Standing Knee Exercises  single leg stance with pallof press with yellow band 2x10  Knee/Hip Exercises: Seated   Sit to Sand  2 sets;10 reps;without UE support   holding 10# weight     Vasopneumatic   Number Minutes Vasopneumatic   15 minutes    Vasopnuematic Location   Knee    Vasopneumatic Pressure  Medium    Vasopneumatic Temperature   3 snowflakes               PT Short Term Goals - 01/17/19 0859      PT SHORT TERM GOAL #1   Title  Pt will demo consistency and independence with his initial HEP to improve ROM and muscle activation    Status  Achieved      PT SHORT TERM GOAL #2   Title  The patient will be able to walk 10-15 minutes for short community ambulation for grocery shopping    Baseline  able to walk 30 minutes in the grocery store    Status  Achieved      PT [place] #3   Title  The patient will have improved left knee ROM 0-138 needed for greater ease getting in/out of the car    Baseline  131  degrees    Time  4    Period  Weeks    Status  On-going      PT SHORT TERM GOAL #4   Title  The patient will have decreased edema left knee to within 1/2 cm to 1 cm difference to right    Baseline  1/2 inch difference    Time  4    Period  Weeks    Status  On-going        PT Long Term Goals - 01/30/19 0735      PT LONG TERM GOAL #1   Title  Pt will be independent with his advanced HEP to allow for continued progress and maintenance of his progress after d/c from PT.     Baseline  --    Time  8    Period  Weeks    Status  On-going      PT LONG TERM GOAL #2   Title  Pt will report at least 70% improvement in knee ROM, strength and mobility which will allow him to resume his prior recreational activity, household chores and work tasks    Baseline  50% improvement    Period  Weeks    Status  On-going      PT LONG TERM GOAL #4   Title  The patient will be able to walk 1 mile needed for general health and leisure activity      PT LONG TERM GOAL #5   Title  FOTO functional outcome score will improve from 57% limitation to 31% limitation    Baseline  43% limitation    Time  8    Period  Weeks    Status  On-going            Plan - 02/06/19 0744    Clinical Impression Statement  Pt with improved gait today with minimal antalgia on level surface today.  Pt reports increased clicking sounds in his Lt knee with mobility.  Pt was able to take a short walk over the weekend without limitation and has been standing more at home.  This often results in edema.  Pt demonstrates reduced eccentric strength and proprioception with single limb activity.  Pt required verbal cues for alignment and speed with exercise.  Pt will continue to benefit from  skilled PT for Lt knee strength, flexibility, proprioception and edema management.    PT Frequency  3x / week    PT Duration  8 weeks    PT Treatment/Interventions  ADLs/Self Care Home Management;Aquatic Therapy;Cryotherapy;Engineer, water;Therapeutic activities;Therapeutic exercise;Iontophoresis 4m/ml Dexamethasone;Patient/family education;Manual techniques;Taping    PT Next Visit Plan  MD note next.  Lt knee strength, flexibility and proprioception    PT Home Exercise Plan  [email or web address]    Consulted and Agree with Plan of Care  Patient       Patient will benefit from skilled therapeutic intervention in order to improve the following deficits and impairments:  Abnormal gait, Difficulty walking, Decreased range of motion, Pain, Decreased strength, Increased edema, Decreased activity tolerance  Visit Diagnosis: Acute pain of left knee  Muscle weakness (generalized)  Stiffness of left knee, not elsewhere classified  Difficulty in walking, not elsewhere classified  Localized edema     Problem List Patient Active Problem List   Diagnosis Date Noted  . Morbid obesity ([place] 08/05/2017  . OSA (obstructive sleep apnea) 04/05/2017  . S/P right knee arthroscopy 03/29/2014    KSigurd Sos PT 02/06/19 8:22 AM  [place] [address] [place] [address]Phone: [phone]  Fax:  [phone] Name: David MCGUIRTMRN: [id number] of Birth: 705-18-1962

## 2019-02-08 ENCOUNTER — Other Ambulatory Visit: Payer: Self-pay

## 2019-02-08 ENCOUNTER — Ambulatory Visit: Payer: 59

## 2019-02-08 DIAGNOSIS — M25662 Stiffness of left knee, not elsewhere classified: Secondary | ICD-10-CM | POA: Diagnosis not present

## 2019-02-08 DIAGNOSIS — M6281 Muscle weakness (generalized): Secondary | ICD-10-CM | POA: Diagnosis not present

## 2019-02-08 DIAGNOSIS — M25562 Pain in left knee: Secondary | ICD-10-CM

## 2019-02-08 DIAGNOSIS — R262 Difficulty in walking, not elsewhere classified: Secondary | ICD-10-CM

## 2019-02-08 DIAGNOSIS — R600 Localized edema: Secondary | ICD-10-CM

## 2019-02-08 NOTE — Therapy (Signed)
[facility] [address] [place], [place], [address] Phone: [phone]   Fax:  [phone]  Physical Therapy Treatment  Patient Details  Name: David Newman MRN: [id number] Date of Birth: 1960-07-17 Referring Provider (PT): Dr. Sydnee Cabal    Encounter Date: 02/08/2019  PT End of Session - 02/08/19 0813    Visit Number  13    Date for PT Re-Evaluation  03/02/19    PT Start Time  0729    PT Stop Time  0828    PT Time Calculation (min)  59 min    Activity Tolerance  Patient tolerated treatment well    Behavior During Therapy  [facility] for tasks assessed/performed       Past Medical History:  Diagnosis Date  . Anal fissure   . At risk for sleep apnea    STOP-BANG= 5    SENT TO PCP 03-27-2014  . CAD (coronary artery disease)   . Colon polyps    hyperplastic  . GERD (gastroesophageal reflux disease)   . Hyperlipidemia   . OA (osteoarthritis) of knee   . Right knee meniscal tear   . Sleep apnea    Wears CPAP.  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 Wears contact lenses   . Wears glasses     Past Surgical History:  Procedure Laterality Date  . COLONOSCOPY  04-24-2012  . FACIAL RECONSTRUCTION SURGERY  1973  age 67  . KNEE ARTHROSCOPY Right 03/29/2014   Procedure: RIGHT ARTHROSCOPY KNEE WITH DEBRIDMENT PARTIAL medial lateral MENISCECTOMY AND CHONDROPLASTY;  Surgeon: Sydnee Cabal, MD;  Location: [place];  Service: Orthopedics;  Laterality: Right;  . KNEE ARTHROSCOPY WITH ANTERIOR CRUCIATE LIGAMENT (ACL) REPAIR Right 1994  . KNEE ARTHROSCOPY WITH MEDIAL MENISECTOMY Left 12/28/2018   Procedure: KNEE ARTHROSCOPY WITH partial MEDIAL MENISECTOMY, debridement chondroplasty;  Surgeon: Sydnee Cabal, MD;  Location: [facility];  Service: Orthopedics;  Laterality: Left;  with knee block  . WISDOM TOOTH EXTRACTION  age 30    There were no vitals filed for this  visit.  Subjective Assessment - 02/08/19 0730    Subjective  I'm seeing the doctor on Monday.    Currently in Pain?  No/denies         [facility] PT Assessment - 02/08/19 0001      Assessment   Medical Diagnosis  left knee scope    Referring Provider (PT)  Dr. Sydnee Cabal     Onset Date/Surgical Date  12/29/18      Observation/Other Assessments   Focus on Therapeutic Outcomes (FOTO)   43% limitation      AROM   Left Knee Extension  2    Left Knee Flexion  144      Strength   Right Hip ABduction  4+/5    Left Hip Flexion  4/5    Left Knee Flexion  4+/5    Left Knee Extension  4+/5      Ambulation/Gait   Ambulation/Gait  Yes    Ambulation/Gait Assistance  7: Independent    Gait Comments  very mild antalgia on level surface                   [facility] Adult PT Treatment/Exercise - 02/08/19 0001      Lumbar Exercises: Stretches   Active Hamstring Stretch  Left;3 reps;20 seconds    Active Hamstring Stretch Limitations  using steps      Lumbar Exercises: Machines for  Strengthening   Leg Press  Seat 6 125# 2 x10 BLE 2x10 with ball squeeze; Seat 7 Lt 75# 2x10 reps       Knee/Hip Exercises: Aerobic   Stationary Bike  --   PT present for status update   Nustep  Level 4  x 10 minutes    PT present to discuss progress     Knee/Hip Exercises: Standing   Forward Step Up  Left;20 reps;Hand Hold: 0;Step Height: 6"    Forward Step Up Limitations  on Bosu    Step Down  Left;Hand Hold: 0;2 sets;10 reps;Step Height: 6"    Rocker Board  3 minutes    SLS  on blue pad 5x 10 seconds    Walking with Sports Cord  30# forward and reversex10 each. emphasis on heel strike, 30# sidestepping x 5 reps      Vasopneumatic   Number Minutes Vasopneumatic   15 minutes    Vasopnuematic Location   Knee    Vasopneumatic Pressure  Medium    Vasopneumatic Temperature   3 snowflakes               PT Short Term Goals - 01/17/19 0859      PT SHORT TERM GOAL #1   Title  Pt will  demo consistency and independence with his initial HEP to improve ROM and muscle activation    Status  Achieved      PT SHORT TERM GOAL #2   Title  The patient will be able to walk 10-15 minutes for short community ambulation for grocery shopping    Baseline  able to walk 30 minutes in the grocery store    Status  Achieved      PT [place] #3   Title  The patient will have improved left knee ROM 0-138 needed for greater ease getting in/out of the car    Baseline  131 degrees    Time  4    Period  Weeks    Status  On-going      PT SHORT TERM GOAL #4   Title  The patient will have decreased edema left knee to within 1/2 cm to 1 cm difference to right    Baseline  1/2 inch difference    Time  4    Period  Weeks    Status  On-going        PT Long Term Goals - 02/08/19 0732      PT LONG TERM GOAL #1   Title  Pt will be independent with his advanced HEP to allow for continued progress and maintenance of his progress after d/c from PT.     Time  8    Period  Weeks    Status  On-going      PT LONG TERM GOAL #2   Title  Pt will report at least 70% improvement in knee ROM, strength and mobility which will allow him to resume his prior recreational activity, household chores and work tasks    Baseline  50% improvement    Time  8    Period  Weeks    Status  On-going      PT LONG TERM GOAL #3   Title  The patient will have 4/5 knee strength and 5-/5 hip strength needed for reciprocal stair climbing, curb negotiation and stooping    Time  8    Period  Weeks    Status  On-going  PT LONG TERM GOAL #4   Title  The patient will be able to walk 1 mile needed for general health and leisure activity    Baseline  0.5 miles    Time  8    Period  Weeks    Status  On-going      PT LONG TERM GOAL #5   Title  FOTO functional outcome score will improve from 57% limitation to 31% limitation    Baseline  43% limitation    Time  8    Period  Weeks    Status  On-going             Plan - 02/08/19 6861    Clinical Impression Statement  Pt with improved gait today with minimal antalgia on level surface today.  Pt reports increased clicking sounds in his Lt knee with mobility.  Pt was able to take a short walk over the weekend without limitation and has been standing more at home.  Pt experiences edema with longer periods of standing. Pt demonstrates improved Lt knee strength and A/ROM.   Pt demonstrates reduced eccentric strength and proprioception with single limb activity.  Pt required verbal cues for alignment and speed with exercise.  Pt will continue to benefit from skilled PT for Lt knee strength, flexibility, proprioception and edema management.    PT Frequency  2x / week    PT Duration  8 weeks    PT Treatment/Interventions  ADLs/Self Care Home Management;Aquatic Therapy;Cryotherapy;Advice worker;Therapeutic activities;Therapeutic exercise;Iontophoresis 69m/ml Dexamethasone;Patient/family education;Manual techniques;Taping    PT Next Visit Plan  See what MD says.  Lt knee strength, flexibility and proprioception    PT Home Exercise Plan  [email or web address]    Consulted and Agree with Plan of Care  Patient       Patient will benefit from skilled therapeutic intervention in order to improve the following deficits and impairments:  Abnormal gait, Difficulty walking, Decreased range of motion, Pain, Decreased strength, Increased edema, Decreased activity tolerance  Visit Diagnosis: Acute pain of left knee  Muscle weakness (generalized)  Stiffness of left knee, not elsewhere classified  Difficulty in walking, not elsewhere classified  Localized edema     Problem List Patient Active Problem List   Diagnosis Date Noted  . Morbid obesity ([place] 08/05/2017  . OSA (obstructive sleep apnea) 04/05/2017  . S/P right knee arthroscopy 03/29/2014    KSigurd Sos PT 02/08/19 8:15 AM  [facility] [address] [place] [address]Phone: [phone]  Fax:  [phone] Name: MARTHA CHIASSONMRN: [id number] of Birth: 705/27/1962

## 2019-02-13 ENCOUNTER — Ambulatory Visit: Payer: 59

## 2019-02-13 ENCOUNTER — Other Ambulatory Visit: Payer: Self-pay

## 2019-02-13 DIAGNOSIS — M25562 Pain in left knee: Secondary | ICD-10-CM

## 2019-02-13 DIAGNOSIS — R600 Localized edema: Secondary | ICD-10-CM

## 2019-02-13 DIAGNOSIS — M6281 Muscle weakness (generalized): Secondary | ICD-10-CM

## 2019-02-13 DIAGNOSIS — R262 Difficulty in walking, not elsewhere classified: Secondary | ICD-10-CM

## 2019-02-13 DIAGNOSIS — M25662 Stiffness of left knee, not elsewhere classified: Secondary | ICD-10-CM

## 2019-02-13 NOTE — Therapy (Signed)
[facility] [address] [place], [place], [address] Phone: [phone]   Fax:  [phone]  Physical Therapy Treatment  Patient Details  Name: David Newman MRN: [id number] Date of Birth: [birth year] Referring Provider (PT): Dr. Sydnee Cabal    Encounter Date: 02/13/2019  PT End of Session - 02/13/19 0815    Visit Number  14    Date for PT Re-Evaluation  03/02/19    PT Start Time  0730    PT Stop Time  0825    PT Time Calculation (min)  55 min    Activity Tolerance  Patient tolerated treatment well    Behavior During Therapy  [facility] for tasks assessed/performed       Past Medical History:  Diagnosis Date  . Anal fissure   . At risk for sleep apnea    STOP-BANG= 5    SENT TO PCP 03-27-2014  . CAD (coronary artery disease)   . Colon polyps    hyperplastic  . GERD (gastroesophageal reflux disease)   . Hyperlipidemia   . OA (osteoarthritis) of knee   . Right knee meniscal tear   . Sleep apnea    Wears CPAP.  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 Wears contact lenses   . Wears glasses     Past Surgical History:  Procedure Laterality Date  . COLONOSCOPY  04-24-2012  . FACIAL RECONSTRUCTION SURGERY  1973  age 10  . KNEE ARTHROSCOPY Right 03/29/2014   Procedure: RIGHT ARTHROSCOPY KNEE WITH DEBRIDMENT PARTIAL medial lateral MENISCECTOMY AND CHONDROPLASTY;  Surgeon: Sydnee Cabal, MD;  Location: [facility];  Service: Orthopedics;  Laterality: Right;  . KNEE ARTHROSCOPY WITH ANTERIOR CRUCIATE LIGAMENT (ACL) REPAIR Right 1994  . KNEE ARTHROSCOPY WITH MEDIAL MENISECTOMY Left 12/28/2018   Procedure: KNEE ARTHROSCOPY WITH partial MEDIAL MENISECTOMY, debridement chondroplasty;  Surgeon: Sydnee Cabal, MD;  Location: [facility];  Service: Orthopedics;  Laterality: Left;  with knee block  . WISDOM TOOTH EXTRACTION  age 32    There were no vitals filed for this  visit.  Subjective Assessment - 02/13/19 0734    Subjective  I saw the MD yesterday.  My surgical notes and pictures are still not available.    Pain Score  0-No pain    Pain Location  Knee    Pain Orientation  Left    Pain Descriptors / Indicators  Sore    Pain Type  Surgical pain    Pain Onset  More than a month ago    Pain Frequency  Intermittent    Aggravating Factors   standing too long    Pain Relieving Factors  ice, rest, stretching                       [facility] Adult PT Treatment/Exercise - 02/13/19 0001      Lumbar Exercises: Stretches   Active Hamstring Stretch  Left;3 reps;20 seconds      Lumbar Exercises: Machines for Strengthening   Leg Press  Seat 6 125# 2 x10 BLE 2x10 with ball squeeze; Seat 7 Lt 75# 2x10 reps       Knee/Hip Exercises: Aerobic   Stationary Bike  Level 4 x 10 minutes   PT present for status update     Knee/Hip Exercises: Standing   Forward Step Up  Left;20 reps;Hand Hold: 0;Step Height: 6"    Step Down  Left;Hand Hold: 0;2 sets;10 reps;Step Height: 6"  Rocker Board  3 minutes    SLS  on blue pad 5x 10 seconds    Walking with Sports Cord  30# forward and reversex10 each. emphasis on heel strike, 30# sidestepping x 5 reps      Vasopneumatic   Number Minutes Vasopneumatic   15 minutes    Vasopnuematic Location   Knee    Vasopneumatic Pressure  Medium    Vasopneumatic Temperature   3 snowflakes               PT Short Term Goals - 01/17/19 0859      PT SHORT TERM GOAL #1   Title  Pt will demo consistency and independence with his initial HEP to improve ROM and muscle activation    Status  Achieved      PT SHORT TERM GOAL #2   Title  The patient will be able to walk 10-15 minutes for short community ambulation for grocery shopping    Baseline  able to walk 30 minutes in the grocery store    Status  Achieved      PT [place] #3   Title  The patient will have improved left knee ROM 0-138 needed for greater  ease getting in/out of the car    Baseline  131 degrees    Time  4    Period  Weeks    Status  On-going      PT SHORT TERM GOAL #4   Title  The patient will have decreased edema left knee to within 1/2 cm to 1 cm difference to right    Baseline  1/2 inch difference    Time  4    Period  Weeks    Status  On-going        PT Long Term Goals - 02/08/19 0732      PT LONG TERM GOAL #1   Title  Pt will be independent with his advanced HEP to allow for continued progress and maintenance of his progress after d/c from PT.     Time  8    Period  Weeks    Status  On-going      PT LONG TERM GOAL #2   Title  Pt will report at least 70% improvement in knee ROM, strength and mobility which will allow him to resume his prior recreational activity, household chores and work tasks    Baseline  50% improvement    Time  8    Period  Weeks    Status  On-going      PT LONG TERM GOAL #3   Title  The patient will have 4/5 knee strength and 5-/5 hip strength needed for reciprocal stair climbing, curb negotiation and stooping    Time  8    Period  Weeks    Status  On-going      PT LONG TERM GOAL #4   Title  The patient will be able to walk 1 mile needed for general health and leisure activity    Baseline  0.5 miles    Time  8    Period  Weeks    Status  On-going      PT LONG TERM GOAL #5   Title  FOTO functional outcome score will improve from 57% limitation to 31% limitation    Baseline  43% limitation    Time  8    Period  Weeks    Status  On-going  Plan - 02/13/19 0744    Clinical Impression Statement  Pt with improved gait today with minimal antalgia on level surface today.  Clicking in the Lt knee is now intermittent.  Pt experiences edema with longer periods of standing. Pt demonstrates improved Lt knee strength and A/ROM.   Pt demonstrates improved eccentric strength and proprioception with single limb activity today.  Pt required verbal cues for alignment and speed  with exercise.  Pt will continue to benefit from skilled PT for Lt knee strength, flexibility, proprioception and edema management.    PT Frequency  2x / week    PT Duration  8 weeks    PT Treatment/Interventions  ADLs/Self Care Home Management;Aquatic Therapy;Cryotherapy;Advice worker;Therapeutic activities;Therapeutic exercise;Iontophoresis 79m/ml Dexamethasone;Patient/family education;Manual techniques;Taping    PT Next Visit Plan  do recert next session for 6 weeks, Lt knee strength, flexibility, and proprioception    PT Home Exercise Plan  [email or web address]    Consulted and Agree with Plan of Care  Patient       Patient will benefit from skilled therapeutic intervention in order to improve the following deficits and impairments:  Abnormal gait, Difficulty walking, Decreased range of motion, Pain, Decreased strength, Increased edema, Decreased activity tolerance  Visit Diagnosis: Acute pain of left knee  Muscle weakness (generalized)  Stiffness of left knee, not elsewhere classified  Difficulty in walking, not elsewhere classified  Localized edema     Problem List Patient Active Problem List   Diagnosis Date Noted  . Morbid obesity ([facility] 08/05/2017  . OSA (obstructive sleep apnea) 04/05/2017  . S/P right knee arthroscopy 03/29/2014     KSigurd Sos PT 02/13/19 8:23 AM  [facility] [address] [place] [address]Phone: [phone]  Fax:  [phone] Name: David MESSMANMRN: [id number] of Birth: 708/31/1962

## 2019-02-15 ENCOUNTER — Ambulatory Visit: Payer: 59 | Attending: Specialist

## 2019-02-15 ENCOUNTER — Other Ambulatory Visit: Payer: Self-pay

## 2019-02-15 DIAGNOSIS — M6281 Muscle weakness (generalized): Secondary | ICD-10-CM | POA: Insufficient documentation

## 2019-02-15 DIAGNOSIS — M25662 Stiffness of left knee, not elsewhere classified: Secondary | ICD-10-CM | POA: Diagnosis not present

## 2019-02-15 DIAGNOSIS — R262 Difficulty in walking, not elsewhere classified: Secondary | ICD-10-CM | POA: Diagnosis not present

## 2019-02-15 DIAGNOSIS — M25562 Pain in left knee: Secondary | ICD-10-CM | POA: Insufficient documentation

## 2019-02-15 DIAGNOSIS — R600 Localized edema: Secondary | ICD-10-CM | POA: Diagnosis not present

## 2019-02-15 NOTE — Therapy (Signed)
[facility] [address], [place], [place], [address] Phone: [phone]   Fax:  [phone]  Physical Therapy Treatment  Patient Details  Name: David Newman MRN: [id number] Date of Birth: 1960-06-11 Referring Provider (PT): Dr. Sydnee Cabal    Encounter Date: 02/15/2019  PT End of Session - 02/15/19 0816    Visit Number  15    Date for PT Re-Evaluation  03/29/19    Authorization Type  UMR    PT Start Time  0740    PT Stop Time  0834    PT Time Calculation (min)  54 min    Activity Tolerance  Patient tolerated treatment well    Behavior During Therapy  [facility] for tasks assessed/performed       Past Medical History:  Diagnosis Date  . Anal fissure   . At risk for sleep apnea    STOP-BANG= 5    SENT TO PCP 03-27-2014  . CAD (coronary artery disease)   . Colon polyps    hyperplastic  . GERD (gastroesophageal reflux disease)   . Hyperlipidemia   . OA (osteoarthritis) of knee   . Right knee meniscal tear   . Sleep apnea    Wears CPAP.  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 Wears contact lenses   . Wears glasses     Past Surgical History:  Procedure Laterality Date  . COLONOSCOPY  04-24-2012  . FACIAL RECONSTRUCTION SURGERY  1973  age 17  . KNEE ARTHROSCOPY Right 03/29/2014   Procedure: RIGHT ARTHROSCOPY KNEE WITH DEBRIDMENT PARTIAL medial lateral MENISCECTOMY AND CHONDROPLASTY;  Surgeon: Sydnee Cabal, MD;  Location: [facility];  Service: Orthopedics;  Laterality: Right;  . KNEE ARTHROSCOPY WITH ANTERIOR CRUCIATE LIGAMENT (ACL) REPAIR Right 1994  . KNEE ARTHROSCOPY WITH MEDIAL MENISECTOMY Left 12/28/2018   Procedure: KNEE ARTHROSCOPY WITH partial MEDIAL MENISECTOMY, debridement chondroplasty;  Surgeon: Sydnee Cabal, MD;  Location: [facility];  Service: Orthopedics;  Laterality: Left;  with knee block  . WISDOM TOOTH EXTRACTION  age 81    There were no vitals  filed for this visit.      [facility] PT Assessment - 02/15/19 0001      Assessment   Medical Diagnosis  left knee scope    Referring Provider (PT)  Dr. Sydnee Cabal     Onset Date/Surgical Date  12/29/18      AROM   Left Knee Extension  2    Left Knee Flexion  144      Strength   Right Hip ABduction  4+/5    Left Hip Flexion  4/5    Left Knee Flexion  4+/5    Left Knee Extension  4+/5      Ambulation/Gait   Ambulation/Gait  Yes    Ambulation/Gait Assistance  7: Independent    Gait Comments  very mild antalgia on level surface                   [facility] Adult PT Treatment/Exercise - 02/15/19 0001      Lumbar Exercises: Stretches   Active Hamstring Stretch  Left;3 reps;20 seconds      Lumbar Exercises: Machines for Strengthening   Leg Press  Seat 8 125# 2 x10 BLE 2x10 with ball squeeze; Seat 8 Lt 75# 2x10 reps       Knee/Hip Exercises: Aerobic   Stationary Bike  Level 4 x 10 minutes   PT present for status update  Knee/Hip Exercises: Standing   Forward Step Up  Left;20 reps;Hand Hold: 0;Step Height: 6"    Forward Step Up Limitations  on Bosu    Rocker Board  3 minutes    SLS  on blue pad 5x 10 seconds    Walking with Sports Cord  30# forward and reversex10 each. emphasis on heel strike, 30# sidestepping x 5 reps      Vasopneumatic   Number Minutes Vasopneumatic   15 minutes    Vasopnuematic Location   Knee    Vasopneumatic Pressure  Medium    Vasopneumatic Temperature   3 snowflakes               PT Short Term Goals - 01/17/19 0859      PT SHORT TERM GOAL #1   Title  Pt will demo consistency and independence with his initial HEP to improve ROM and muscle activation    Status  Achieved      PT SHORT TERM GOAL #2   Title  The patient will be able to walk 10-15 minutes for short community ambulation for grocery shopping    Baseline  able to walk 30 minutes in the grocery store    Status  Achieved      PT [place] #3   Title  The  patient will have improved left knee ROM 0-138 needed for greater ease getting in/out of the car    Baseline  131 degrees    Time  4    Period  Weeks    Status  On-going      PT SHORT TERM GOAL #4   Title  The patient will have decreased edema left knee to within 1/2 cm to 1 cm difference to right    Baseline  1/2 inch difference    Time  4    Period  Weeks    Status  On-going        PT Long Term Goals - 02/15/19 0757      PT LONG TERM GOAL #1   Title  Pt will be independent with his advanced HEP to allow for continued progress and maintenance of his progress after d/c from PT.     Time  6    Period  Weeks    Status  On-going    Target Date  03/29/19      PT LONG TERM GOAL #2   Title  Pt will report at least 70% improvement in knee ROM, strength and mobility which will allow him to resume his prior recreational activity, household chores and work tasks    Baseline  50% improvement    Time  6    Period  Weeks    Status  On-going    Target Date  03/29/19      PT LONG TERM GOAL #3   Title  The patient will have 4/5 knee strength and 5-/5 hip strength needed for reciprocal stair climbing, curb negotiation and stooping    Time  6    Period  Weeks    Status  On-going    Target Date  03/29/19      PT LONG TERM GOAL #4   Title  The patient will be able to walk 1 mile needed for general health and leisure activity    Baseline  15 minutes- 0.5 minutes    Time  6    Period  Weeks    Status  On-going    Target Date  03/29/19      PT LONG TERM GOAL #5   Title  FOTO functional outcome score will improve from 57% limitation to 31% limitation    Baseline  43% limitation    Time  6    Period  Weeks    Status  On-going    Target Date  03/29/19            Plan - 02/15/19 0801    Clinical Impression Statement  Pt with improved gait today with minimal antalgia on level surface today.  Clicking in the Lt knee is now intermittent.  Pt experiences edema with longer periods of  standing. Pt demonstrates improved Lt knee strength and A/ROM.   Pt demonstrates improved eccentric strength and proprioception with single limb activity today.  Pt is limited to walking 15 minutes max due to fatigue and pain with prolonged standing.  Pt required verbal cues for alignment and speed with exercise.  Pt will continue to benefit from skilled PT for Lt knee strength, flexibility, proprioception and edema management.    PT Frequency  2x / week    PT Duration  6 weeks    PT Treatment/Interventions  ADLs/Self Care Home Management;Aquatic Therapy;Cryotherapy;Advice worker;Therapeutic activities;Therapeutic exercise;Iontophoresis 66m/ml Dexamethasone;Patient/family education;Manual techniques;Taping    PT Next Visit Plan  Lt knee strength, flexibility, and proprioception    PT Home Exercise Plan  [email or web address]    Consulted and Agree with Plan of Care  Patient       Patient will benefit from skilled therapeutic intervention in order to improve the following deficits and impairments:  Abnormal gait, Difficulty walking, Decreased range of motion, Pain, Decreased strength, Increased edema, Decreased activity tolerance  Visit Diagnosis: Acute pain of left knee - Plan: PT plan of care cert/re-cert  Muscle weakness (generalized) - Plan: PT plan of care cert/re-cert  Stiffness of left knee, not elsewhere classified - Plan: PT plan of care cert/re-cert  Difficulty in walking, not elsewhere classified - Plan: PT plan of care cert/re-cert  Localized edema - Plan: PT plan of care cert/re-cert     Problem List Patient Active Problem List   Diagnosis Date Noted  . Morbid obesity ([facility] 08/05/2017  . OSA (obstructive sleep apnea) 04/05/2017  . S/P right knee arthroscopy 03/29/2014   KSigurd Sos PT 02/15/19 8:18 AM  [facility] [address] [place] [address]Phone: [phone]  Fax:   [phone] Name: MTAG WURTZMRN: [id number] of Birth: 701/14/1962

## 2019-02-20 ENCOUNTER — Ambulatory Visit: Payer: 59

## 2019-02-20 ENCOUNTER — Other Ambulatory Visit: Payer: Self-pay

## 2019-02-20 DIAGNOSIS — R262 Difficulty in walking, not elsewhere classified: Secondary | ICD-10-CM | POA: Diagnosis not present

## 2019-02-20 DIAGNOSIS — M6281 Muscle weakness (generalized): Secondary | ICD-10-CM | POA: Diagnosis not present

## 2019-02-20 DIAGNOSIS — R600 Localized edema: Secondary | ICD-10-CM | POA: Diagnosis not present

## 2019-02-20 DIAGNOSIS — M25662 Stiffness of left knee, not elsewhere classified: Secondary | ICD-10-CM | POA: Diagnosis not present

## 2019-02-20 DIAGNOSIS — M25562 Pain in left knee: Secondary | ICD-10-CM | POA: Diagnosis not present

## 2019-02-20 NOTE — Therapy (Signed)
[facility] [address] [place], [place], [address] Phone: [phone]   Fax:  [phone]  Physical Therapy Treatment  Patient Details  Name: David Newman MRN: [id number] Date of Birth: 1960/05/28 Referring Provider (PT): Dr. Sydnee Cabal    Encounter Date: 02/20/2019  PT End of Session - 02/20/19 0808    Visit Number  16    Date for PT Re-Evaluation  03/29/19    Authorization Type  UMR    PT Start Time  0729    PT Stop Time  0825    PT Time Calculation (min)  56 min    Activity Tolerance  Patient tolerated treatment well    Behavior During Therapy  [facility] for tasks assessed/performed       Past Medical History:  Diagnosis Date  . Anal fissure   . At risk for sleep apnea    STOP-BANG= 5    SENT TO PCP 03-27-2014  . CAD (coronary artery disease)   . Colon polyps    hyperplastic  . GERD (gastroesophageal reflux disease)   . Hyperlipidemia   . OA (osteoarthritis) of knee   . Right knee meniscal tear   . Sleep apnea    Wears CPAP.  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 Wears contact lenses   . Wears glasses     Past Surgical History:  Procedure Laterality Date  . COLONOSCOPY  04-24-2012  . FACIAL RECONSTRUCTION SURGERY  1973  age 58  . KNEE ARTHROSCOPY Right 03/29/2014   Procedure: RIGHT ARTHROSCOPY KNEE WITH DEBRIDMENT PARTIAL medial lateral MENISCECTOMY AND CHONDROPLASTY;  Surgeon: Sydnee Cabal, MD;  Location: [facility];  Service: Orthopedics;  Laterality: Right;  . KNEE ARTHROSCOPY WITH ANTERIOR CRUCIATE LIGAMENT (ACL) REPAIR Right 1994  . KNEE ARTHROSCOPY WITH MEDIAL MENISECTOMY Left 12/28/2018   Procedure: KNEE ARTHROSCOPY WITH partial MEDIAL MENISECTOMY, debridement chondroplasty;  Surgeon: Sydnee Cabal, MD;  Location: [facility];  Service: Orthopedics;  Laterality: Left;  with knee block  . WISDOM TOOTH EXTRACTION  age 58    There were no vitals  filed for this visit.  Subjective Assessment - 02/20/19 0739    Subjective  I walked a mile a few times since the last time I was here.  It was challenging at the end and my gait was off a little bit.    Patient Stated Goals  Get back to where I was;  walk 1-2 miles a day; ride a bike; some level of exercise, push mower; return to trout fishing; kayak    Currently in Pain?  Yes    Pain Location  Knee    Pain Orientation  Left    Pain Descriptors / Indicators  Sore    Pain Type  Surgical pain    Pain Onset  More than a month ago    Pain Frequency  Intermittent    Aggravating Factors   walking too long, standing too long    Pain Relieving Factors  ice, rest, stretching                       [facility] Adult PT Treatment/Exercise - 02/20/19 0001      Lumbar Exercises: Stretches   Active Hamstring Stretch  Left;3 reps;20 seconds      Lumbar Exercises: Machines for Strengthening   Leg Press  Seat 8 130# 2 x10 BLE 2x10 with ball squeeze; Seat 8 Lt 80# 2x10 reps  Knee/Hip Exercises: Aerobic   Stationary Bike  Level 4 x 8 minutes   PT present for status update     Knee/Hip Exercises: Standing   Forward Step Up  Left;20 reps;Hand Hold: 0;Step Height: 6"    Forward Step Up Limitations  on Bosu    Rocker Board  3 minutes    SLS  on blue pad 5x 10 seconds    Walking with Sports Cord  35# forward and reversex10 each. emphasis on heel strike, 30# sidestepping x 5 reps      Vasopneumatic   Number Minutes Vasopneumatic   15 minutes    Vasopnuematic Location   Knee    Vasopneumatic Pressure  Medium    Vasopneumatic Temperature   3 snowflakes               PT Short Term Goals - 01/17/19 0859      PT SHORT TERM GOAL #1   Title  Pt will demo consistency and independence with his initial HEP to improve ROM and muscle activation    Status  Achieved      PT SHORT TERM GOAL #2   Title  The patient will be able to walk 10-15 minutes for short community ambulation for  grocery shopping    Baseline  able to walk 30 minutes in the grocery store    Status  Achieved      PT [place] #3   Title  The patient will have improved left knee ROM 0-138 needed for greater ease getting in/out of the car    Baseline  131 degrees    Time  4    Period  Weeks    Status  On-going      PT SHORT TERM GOAL #4   Title  The patient will have decreased edema left knee to within 1/2 cm to 1 cm difference to right    Baseline  1/2 inch difference    Time  4    Period  Weeks    Status  On-going        PT Long Term Goals - 02/20/19 0741      PT LONG TERM GOAL #2   Title  Pt will report at least 70% improvement in knee ROM, strength and mobility which will allow him to resume his prior recreational activity, household chores and work tasks    Baseline  50% improvement    Time  6    Period  Weeks    Status  On-going      PT LONG TERM GOAL #4   Title  The patient will be able to walk 1 mile needed for general health and leisure activity    Baseline  able to walk 1 mile- challenging at end    Time  6    Period  Weeks    Status  On-going            Plan - 02/20/19 0756    Clinical Impression Statement  Pt is making progress and is now able to walk 1 mile without rest.  Pt reports antalgia and difficulty at the end of the 1 mile walk.  Pt was able to tolerate increased resistance on leg press and challenge on balance pad.  Pt required minor verbal cues for alignment with squats and leg press.  Pt will continue to benefit from skilled PT for LE strength, proprioception and endurance.    PT Frequency  2x / week  PT Duration  6 weeks    PT Treatment/Interventions  ADLs/Self Care Home Management;Aquatic Therapy;Cryotherapy;Advice worker;Therapeutic activities;Therapeutic exercise;Iontophoresis 31m/ml Dexamethasone;Patient/family education;Manual techniques;Taping    PT Next Visit Plan  Lt knee strength, flexibility, and proprioception     PT Home Exercise Plan  [email or web address]    Recommended Other Services  initial cert is signed    Consulted and Agree with Plan of Care  Patient       Patient will benefit from skilled therapeutic intervention in order to improve the following deficits and impairments:  Abnormal gait, Difficulty walking, Decreased range of motion, Pain, Decreased strength, Increased edema, Decreased activity tolerance  Visit Diagnosis: Acute pain of left knee  Muscle weakness (generalized)  Stiffness of left knee, not elsewhere classified  Difficulty in walking, not elsewhere classified  Localized edema     Problem List Patient Active Problem List   Diagnosis Date Noted  . Morbid obesity ([place] 08/05/2017  . OSA (obstructive sleep apnea) 04/05/2017  . S/P right knee arthroscopy 03/29/2014     KSigurd Sos PT 02/20/19 8:13 AM  [facility] [address] [place] [address]Phone: [phone]  Fax:  [phone] Name: MEPHREM CARRICKMRN: [id number] of Birth: 03/27/1961-05-22

## 2019-02-22 ENCOUNTER — Ambulatory Visit: Payer: 59

## 2019-02-22 ENCOUNTER — Other Ambulatory Visit: Payer: Self-pay

## 2019-02-22 DIAGNOSIS — M25662 Stiffness of left knee, not elsewhere classified: Secondary | ICD-10-CM

## 2019-02-22 DIAGNOSIS — R600 Localized edema: Secondary | ICD-10-CM

## 2019-02-22 DIAGNOSIS — R262 Difficulty in walking, not elsewhere classified: Secondary | ICD-10-CM

## 2019-02-22 DIAGNOSIS — M6281 Muscle weakness (generalized): Secondary | ICD-10-CM | POA: Diagnosis not present

## 2019-02-22 DIAGNOSIS — M25562 Pain in left knee: Secondary | ICD-10-CM | POA: Diagnosis not present

## 2019-02-22 NOTE — Therapy (Signed)
[facility] [address] [place], [place], [address] Phone: [phone]   Fax:  [phone]  Physical Therapy Treatment  Patient Details  Name: RIMAS GILHAM MRN: [id number] Date of Birth: 04-03-1961 Referring Provider (PT): Dr. Sydnee Cabal    Encounter Date: 02/22/2019  PT End of Session - 02/22/19 0810    Visit Number  17    Date for PT Re-Evaluation  03/29/19    Authorization Type  UMR    PT Start Time  0730    PT Stop Time  0824    PT Time Calculation (min)  54 min    Activity Tolerance  Patient tolerated treatment well    Behavior During Therapy  [facility] for tasks assessed/performed       Past Medical History:  Diagnosis Date  . Anal fissure   . At risk for sleep apnea    STOP-BANG= 5    SENT TO PCP 03-27-2014  . CAD (coronary artery disease)   . Colon polyps    hyperplastic  . GERD (gastroesophageal reflux disease)   . Hyperlipidemia   . OA (osteoarthritis) of knee   . Right knee meniscal tear   . Sleep apnea    Wears CPAP.  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 Wears contact lenses   . Wears glasses     Past Surgical History:  Procedure Laterality Date  . COLONOSCOPY  04-24-2012  . FACIAL RECONSTRUCTION SURGERY  1973  age 58  . KNEE ARTHROSCOPY Right 03/29/2014   Procedure: RIGHT ARTHROSCOPY KNEE WITH DEBRIDMENT PARTIAL medial lateral MENISCECTOMY AND CHONDROPLASTY;  Surgeon: Sydnee Cabal, MD;  Location: [place];  Service: Orthopedics;  Laterality: Right;  . KNEE ARTHROSCOPY WITH ANTERIOR CRUCIATE LIGAMENT (ACL) REPAIR Right 1994  . KNEE ARTHROSCOPY WITH MEDIAL MENISECTOMY Left 12/28/2018   Procedure: KNEE ARTHROSCOPY WITH partial MEDIAL MENISECTOMY, debridement chondroplasty;  Surgeon: Sydnee Cabal, MD;  Location: [facility];  Service: Orthopedics;  Laterality: Left;  with knee block  . WISDOM TOOTH EXTRACTION  age 58    There were no vitals  filed for this visit.  Subjective Assessment - 02/22/19 0734    Subjective  I am feeling OK    Currently in Pain?  Yes    Pain Score  2     Pain Location  Knee    Pain Orientation  Left    Pain Descriptors / Indicators  Sore    Pain Type  Surgical pain    Pain Onset  More than a month ago    Pain Frequency  Intermittent    Aggravating Factors   walking, standing    Pain Relieving Factors  ice, rest, stretching                       [facility] Adult PT Treatment/Exercise - 02/22/19 0001      Lumbar Exercises: Stretches   Active Hamstring Stretch  Left;3 reps;20 seconds      Lumbar Exercises: Machines for Strengthening   Leg Press  Seat 8 130# 2 x10 BLE 2x20; Seat 8 Lt 80# 2x10 reps       Lumbar Exercises: Standing   Other Standing Lumbar Exercises  single leg stance on Lt with pull down with green band (diagonal) 2x15 each direction      Knee/Hip Exercises: Aerobic   Stationary Bike  Level 5 x 8 minutes   PT present for status update  Knee/Hip Exercises: Standing   Forward Step Up  Left;20 reps;Hand Hold: 0;Step Height: 6"    Forward Step Up Limitations  on Bosu    SLS  on blue pad 5x 10 seconds    Walking with Sports Cord  35# forward and reversex10 each. emphasis on heel strike, 35# sidestepping x 5 reps      Vasopneumatic   Number Minutes Vasopneumatic   15 minutes    Vasopnuematic Location   Knee    Vasopneumatic Pressure  Medium    Vasopneumatic Temperature   3 snowflakes               PT Short Term Goals - 01/17/19 0859      PT SHORT TERM GOAL #1   Title  Pt will demo consistency and independence with his initial HEP to improve ROM and muscle activation    Status  Achieved      PT SHORT TERM GOAL #2   Title  The patient will be able to walk 10-15 minutes for short community ambulation for grocery shopping    Baseline  able to walk 30 minutes in the grocery store    Status  Achieved      PT [place] #3   Title  The patient will  have improved left knee ROM 0-138 needed for greater ease getting in/out of the car    Baseline  131 degrees    Time  4    Period  Weeks    Status  On-going      PT SHORT TERM GOAL #4   Title  The patient will have decreased edema left knee to within 1/2 cm to 1 cm difference to right    Baseline  1/2 inch difference    Time  4    Period  Weeks    Status  On-going        PT Long Term Goals - 02/20/19 0741      PT LONG TERM GOAL #2   Title  Pt will report at least 70% improvement in knee ROM, strength and mobility which will allow him to resume his prior recreational activity, household chores and work tasks    Baseline  50% improvement    Time  6    Period  Weeks    Status  On-going      PT LONG TERM GOAL #4   Title  The patient will be able to walk 1 mile needed for general health and leisure activity    Baseline  able to walk 1 mile- challenging at end    Time  6    Period  Weeks    Status  On-going            Plan - 02/22/19 0746    Clinical Impression Statement  Pt is making progress and is now able to walk 1 mile without rest and reports incremental improvements in Lt knee pain and limitations related to Lt knee.  Pt has been able to advance weight with resistance this week.  Pt is exercising at the gym including squats and treadmill.  Pt with Lt medial knee pain at the end of his walk on the treadmill.   Pt required minor verbal cues for alignment with squats and leg press.  Pt will continue to benefit from skilled PT for LE strength, proprioception and endurance.    PT Frequency  2x / week    PT Duration  6 weeks  PT Treatment/Interventions  ADLs/Self Care Home Management;Aquatic Therapy;Cryotherapy;Advice worker;Therapeutic activities;Therapeutic exercise;Iontophoresis 77m/ml Dexamethasone;Patient/family education;Manual techniques;Taping    PT Next Visit Plan  Lt knee strength, flexibility, and proprioception    PT Home Exercise Plan   [email or web address]    Consulted and Agree with Plan of Care  Patient       Patient will benefit from skilled therapeutic intervention in order to improve the following deficits and impairments:  Abnormal gait, Difficulty walking, Decreased range of motion, Pain, Decreased strength, Increased edema, Decreased activity tolerance  Visit Diagnosis: Acute pain of left knee  Muscle weakness (generalized)  Stiffness of left knee, not elsewhere classified  Difficulty in walking, not elsewhere classified  Localized edema     Problem List Patient Active Problem List   Diagnosis Date Noted  . Morbid obesity ([place] 08/05/2017  . OSA (obstructive sleep apnea) 04/05/2017  . S/P right knee arthroscopy 03/29/2014    KSigurd Sos PT 02/22/19 8:12 AM  [facility] [address] [place] [address]Phone: [phone]  Fax:  [phone] Name: MQUAMERE MUSSELLMRN: [id number] of Birth: 7Sep 08, 1962

## 2019-02-27 ENCOUNTER — Ambulatory Visit: Payer: 59 | Admitting: Physical Therapy

## 2019-02-27 ENCOUNTER — Other Ambulatory Visit: Payer: Self-pay

## 2019-02-27 ENCOUNTER — Encounter: Payer: Self-pay | Admitting: Physical Therapy

## 2019-02-27 DIAGNOSIS — R600 Localized edema: Secondary | ICD-10-CM | POA: Diagnosis not present

## 2019-02-27 DIAGNOSIS — M25662 Stiffness of left knee, not elsewhere classified: Secondary | ICD-10-CM

## 2019-02-27 DIAGNOSIS — R262 Difficulty in walking, not elsewhere classified: Secondary | ICD-10-CM | POA: Diagnosis not present

## 2019-02-27 DIAGNOSIS — M6281 Muscle weakness (generalized): Secondary | ICD-10-CM

## 2019-02-27 DIAGNOSIS — M25562 Pain in left knee: Secondary | ICD-10-CM | POA: Diagnosis not present

## 2019-02-27 NOTE — Therapy (Signed)
[facility] [address] [place], [place], [address] Phone: [phone]   Fax:  [phone]  Physical Therapy Treatment  Patient Details  Name: David Newman MRN: [id number] Date of Birth: 11-Apr-1961 Referring Provider (PT): Dr. Sydnee Cabal    Encounter Date: 02/27/2019  PT End of Session - 02/27/19 0734    Visit Number  18    Date for PT Re-Evaluation  03/29/19    Authorization Type  UMR    PT Start Time  0731    PT Stop Time  0815    PT Time Calculation (min)  44 min    Activity Tolerance  Patient tolerated treatment well       Past Medical History:  Diagnosis Date  . Anal fissure   . At risk for sleep apnea    STOP-BANG= 5    SENT TO PCP 03-27-2014  . CAD (coronary artery disease)   . Colon polyps    hyperplastic  . GERD (gastroesophageal reflux disease)   . Hyperlipidemia   . OA (osteoarthritis) of knee   . Right knee meniscal tear   . Sleep apnea    Wears CPAP.  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 Wears contact lenses   . Wears glasses     Past Surgical History:  Procedure Laterality Date  . COLONOSCOPY  04-24-2012  . FACIAL RECONSTRUCTION SURGERY  1973  age 58  . KNEE ARTHROSCOPY Right 03/29/2014   Procedure: RIGHT ARTHROSCOPY KNEE WITH DEBRIDMENT PARTIAL medial lateral MENISCECTOMY AND CHONDROPLASTY;  Surgeon: Sydnee Cabal, MD;  Location: [place];  Service: Orthopedics;  Laterality: Right;  . KNEE ARTHROSCOPY WITH ANTERIOR CRUCIATE LIGAMENT (ACL) REPAIR Right 1994  . KNEE ARTHROSCOPY WITH MEDIAL MENISECTOMY Left 12/28/2018   Procedure: KNEE ARTHROSCOPY WITH partial MEDIAL MENISECTOMY, debridement chondroplasty;  Surgeon: Sydnee Cabal, MD;  Location: [facility];  Service: Orthopedics;  Laterality: Left;  with knee block  . WISDOM TOOTH EXTRACTION  age 58    There were no vitals filed for this visit.  Subjective Assessment - 02/27/19 0731     Subjective  Baby incremental improvements.  Going on a fishing trip in 2 weeks.  Lots of walking.  Increasing limp with walking.    Pertinent History  right knee ACL and scope;  weakness from L5-S1 left LE had previous PT last Winter at [facility];  colitis;  patient notes preoperative muscle atrophy in thigh;  left knee scope 12/28/18    Patient Stated Goals  Get back to where I was;  walk 1-2 miles a day; ride a bike; some level of exercise, push mower; return to trout fishing; kayak    Currently in Pain?  Yes    Pain Score  2     Pain Location  Knee    Pain Orientation  Left    Pain Type  Surgical pain                       [facility] Adult PT Treatment/Exercise - 02/27/19 0001      Lumbar Exercises: Machines for Strengthening   Leg Press  Seat 8 130# 2 x10 BLE 2x20; Seat 8 Lt 80# 2x10 reps       Knee/Hip Exercises: Stretches   Active Hamstring Stretch  Right;Left;5 reps    Active Hamstring Stretch Limitations  on 2nd step     Hip Flexor Stretch  Right;Left;5 reps    Hip Flexor Stretch Limitations  on 2nd step       Knee/Hip Exercises: Aerobic   Stationary Bike  L4 10 min while discussing status       Knee/Hip Exercises: Standing   Heel Raises  Both;10 reps    Heel Raises Limitations  with eccentric lifts/lowers    SLS  4 ways with green band 10x each ways       Vasopneumatic   Number Minutes Vasopneumatic   15 minutes    Vasopnuematic Location   Knee    Vasopneumatic Pressure  Medium    Vasopneumatic Temperature   3 snowflakes               PT Short Term Goals - 01/17/19 0859      PT SHORT TERM GOAL #1   Title  Pt will demo consistency and independence with his initial HEP to improve ROM and muscle activation    Status  Achieved      PT SHORT TERM GOAL #2   Title  The patient will be able to walk 10-15 minutes for short community ambulation for grocery shopping    Baseline  able to walk 30 minutes in the grocery store    Status  Achieved      PT [place] #3   Title  The patient will have improved left knee ROM 0-138 needed for greater ease getting in/out of the car    Baseline  131 degrees    Time  4    Period  Weeks    Status  On-going      PT SHORT TERM GOAL #4   Title  The patient will have decreased edema left knee to within 1/2 cm to 1 cm difference to right    Baseline  1/2 inch difference    Time  4    Period  Weeks    Status  On-going        PT Long Term Goals - 02/20/19 0741      PT LONG TERM GOAL #2   Title  Pt will report at least 70% improvement in knee ROM, strength and mobility which will allow him to resume his prior recreational activity, household chores and work tasks    Baseline  50% improvement    Time  6    Period  Weeks    Status  On-going      PT LONG TERM GOAL #4   Title  The patient will be able to walk 1 mile needed for general health and leisure activity    Baseline  able to walk 1 mile- challenging at end    Time  6    Period  Weeks    Status  On-going            Plan - 02/27/19 1119    Clinical Impression Statement  The patient  reports variable knee pain based on activity level.  He is able to perform intermediate level LE strengthening with lower pain level  intensity.  He is concerned about his upcoming trout fishing trip which will include lots of walking and negotiating on slippery rocks.  For short distances, his gait pattern is much improved however he reports with longer distances his limp increases with fatigue.  Therapist closely monitoring pain response and providing min verbal cues for knee alignment to optimize the benefits of strengthening.    Comorbidities  history of right knee pathology;  history LBP    Examination-Activity Limitations  Locomotion Level;Lift;Stairs;Squat;Carry;Stand  Examination-Participation Restrictions  Meal Prep;Cleaning;Community Activity    Rehab Potential  Good    PT Frequency  2x / week    PT Duration  6 weeks    PT  Treatment/Interventions  ADLs/Self Care Home Management;Aquatic Therapy;Cryotherapy;Advice worker;Therapeutic activities;Therapeutic exercise;Iontophoresis 66m/ml Dexamethasone;Patient/family education;Manual techniques;Taping    PT Next Visit Plan  Lt knee strength, flexibility, and proprioception    PT Home Exercise Plan  [place]      Patient will benefit from skilled therapeutic intervention in order to improve the following deficits and impairments:  Abnormal gait, Difficulty walking, Decreased range of motion, Pain, Decreased strength, Increased edema, Decreased activity tolerance  Visit Diagnosis: Acute pain of left knee  Muscle weakness (generalized)  Stiffness of left knee, not elsewhere classified     Problem List Patient Active Problem List   Diagnosis Date Noted  . Morbid obesity ([place] 08/05/2017  . OSA (obstructive sleep apnea) 04/05/2017  . S/P right knee arthroscopy 03/29/2014   SRuben Im PT 02/27/19 2:09 PM Phone: [phone]: [phone] Singh10/13/2020, 2Howie IllPM  [facility] [address] [place] [address]Phone: [phone]  Fax:  [phone] Name: David NIXMRN: [id number] of Birth: 05/07/1961-07-23

## 2019-03-01 ENCOUNTER — Other Ambulatory Visit: Payer: Self-pay

## 2019-03-01 ENCOUNTER — Ambulatory Visit: Payer: 59 | Admitting: Physical Therapy

## 2019-03-01 DIAGNOSIS — R600 Localized edema: Secondary | ICD-10-CM

## 2019-03-01 DIAGNOSIS — M25562 Pain in left knee: Secondary | ICD-10-CM | POA: Diagnosis not present

## 2019-03-01 DIAGNOSIS — R262 Difficulty in walking, not elsewhere classified: Secondary | ICD-10-CM | POA: Diagnosis not present

## 2019-03-01 DIAGNOSIS — M6281 Muscle weakness (generalized): Secondary | ICD-10-CM | POA: Diagnosis not present

## 2019-03-01 DIAGNOSIS — M25662 Stiffness of left knee, not elsewhere classified: Secondary | ICD-10-CM

## 2019-03-01 NOTE — Therapy (Signed)
[facility] [address] [place], [place], [address] Phone: [phone]   Fax:  [phone]  Physical Therapy Treatment  Patient Details  Name: David Newman MRN: [id number] Date of Birth: 1960-12-16 Referring Provider (PT): Dr. Sydnee Cabal    Encounter Date: 03/01/2019  PT End of Session - 03/01/19 0909    Visit Number  19    Date for PT Re-Evaluation  03/29/19    Authorization Type  UMR    PT Start Time  0733    PT Stop Time  0812    PT Time Calculation (min)  39 min    Activity Tolerance  Patient tolerated treatment well       Past Medical History:  Diagnosis Date  . Anal fissure   . At risk for sleep apnea    STOP-BANG= 5    SENT TO PCP 03-27-2014  . CAD (coronary artery disease)   . Colon polyps    hyperplastic  . GERD (gastroesophageal reflux disease)   . Hyperlipidemia   . OA (osteoarthritis) of knee   . Right knee meniscal tear   . Sleep apnea    Wears CPAP.  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 Wears contact lenses   . Wears glasses     Past Surgical History:  Procedure Laterality Date  . COLONOSCOPY  04-24-2012  . FACIAL RECONSTRUCTION SURGERY  1973  age 58  . KNEE ARTHROSCOPY Right 03/29/2014   Procedure: RIGHT ARTHROSCOPY KNEE WITH DEBRIDMENT PARTIAL medial lateral MENISCECTOMY AND CHONDROPLASTY;  Surgeon: Sydnee Cabal, MD;  Location: [place];  Service: Orthopedics;  Laterality: Right;  . KNEE ARTHROSCOPY WITH ANTERIOR CRUCIATE LIGAMENT (ACL) REPAIR Right 1994  . KNEE ARTHROSCOPY WITH MEDIAL MENISECTOMY Left 12/28/2018   Procedure: KNEE ARTHROSCOPY WITH partial MEDIAL MENISECTOMY, debridement chondroplasty;  Surgeon: Sydnee Cabal, MD;  Location[facility];  Service: Orthopedics;  Laterality: Left;  with knee block  . WISDOM TOOTH EXTRACTION  age 58    There were no vitals filed for this visit.  Subjective Assessment - 03/01/19 0732     Subjective  No new complaints.  States he's been doing his floor exercises at home.    Pertinent History  right knee ACL and scope;  weakness from L5-S1 left LE had previous PT last Winter at [facility];  colitis;  patient notes preoperative muscle atrophy in thigh;  left knee scope 12/28/18    Patient Stated Goals  Get back to where I was;  walk 1-2 miles a day; ride a bike; some level of exercise, push mower; return to trout fishing; kayak    Currently in Pain?  Yes    Pain Score  2     Pain Location  Knee    Pain Orientation  Left    Pain Type  Surgical pain         [facility] PT Assessment - 03/01/19 0001      Strength   Left Knee Flexion  4+/5    Left Knee Extension  4+/5                   [facility] Adult PT Treatment/Exercise - 03/01/19 0001      Lumbar Exercises: Machines for Strengthening   Leg Press  Seat 8 130# 2 x10 BLE 2x20; Seat 8 Lt 80# 2x10 reps       Knee/Hip Exercises: Stretches   Hip Flexor Stretch  Right;Left;5 reps    Hip Flexor  Stretch Limitations  on 2nd step with UE elevation reach up and over     Gastroc Stretch  Right;Left;3 reps    Gastroc Stretch Limitations  slant board on floor       Knee/Hip Exercises: Aerobic   Stationary Bike  L4 5 min while discussing status       Knee/Hip Exercises: Standing   Forward Step Up  Left;20 reps;Hand Hold: 0;Step Height: 6"    Forward Step Up Limitations  with opposite leg chair touch with 5 sec balance     SLS with Vectors  floor sliders abduction 10x right/left     Other Standing Knee Exercises  high step ladder walk 3 laps     Other Standing Knee Exercises  4 stepping stones walking on soft pods 7x      Vasopneumatic   Number Minutes Vasopneumatic   12 minutes   shortened time due to patient lost a contact lens   Vasopnuematic Location   Knee    Vasopneumatic Pressure  Medium    Vasopneumatic Temperature   3 snowflakes               PT Short Term Goals - 01/17/19 0859      PT SHORT TERM GOAL #1    Title  Pt will demo consistency and independence with his initial HEP to improve ROM and muscle activation    Status  Achieved      PT SHORT TERM GOAL #2   Title  The patient will be able to walk 10-15 minutes for short community ambulation for grocery shopping    Baseline  able to walk 30 minutes in the grocery store    Status  Achieved      PT SHORT TERM GOAL #3   Title  The patient will have improved left knee ROM 0-138 needed for greater ease getting in/out of the car    Baseline  131 degrees    Time  4    Period  Weeks    Status  On-going      PT SHORT TERM GOAL #4   Title  The patient will have decreased edema left knee to within 1/2 cm to 1 cm difference to right    Baseline  1/2 inch difference    Time  4    Period  Weeks    Status  On-going        PT Long Term Goals - 02/20/19 0741      PT LONG TERM GOAL #2   Title  Pt will report at least 70% improvement in knee ROM, strength and mobility which will allow him to resume his prior recreational activity, household chores and work tasks    Baseline  50% improvement    Time  6    Period  Weeks    Status  On-going      PT LONG TERM GOAL #4   Title  The patient will be able to walk 1 mile needed for general health and leisure activity    Baseline  able to walk 1 mile- challenging at end    Time  6    Period  Weeks    Status  On-going            Plan - 03/01/19 0911    Clinical Impression Statement  The patient has improving proprioception and is able to perform soft surface walking without difficulty.  High step walking through floor ladder is more challenging but improves with  repetition.  Able to perform 6 inch step ups with single leg balance at the top without UE assist at all.  Therapist closely monitoring response with all interventions.  No cues needed for patellofemoral alignment.    Comorbidities  history of right knee pathology;  history LBP    Examination-Activity Limitations  Locomotion  Level;Lift;Stairs;Squat;Carry;Stand    Examination-Participation Restrictions  Meal Prep;Cleaning;Community Activity    Rehab Potential  Good    PT Frequency  2x / week    PT Duration  6 weeks    PT Treatment/Interventions  ADLs/Self Care Home Management;Aquatic Therapy;Cryotherapy;Advice worker;Therapeutic activities;Therapeutic exercise;Iontophoresis 84m/ml Dexamethasone;Patient/family education;Manual techniques;Taping    PT Next Visit Plan  Lt knee strength, flexibility, and proprioception;  recheck ROM, MMT, swelling measurements    PT Home Exercise Plan  [place]      Patient will benefit from skilled therapeutic intervention in order to improve the following deficits and impairments:  Abnormal gait, Difficulty walking, Decreased range of motion, Pain, Decreased strength, Increased edema, Decreased activity tolerance  Visit Diagnosis: Acute pain of left knee  Muscle weakness (generalized)  Stiffness of left knee, not elsewhere classified  Difficulty in walking, not elsewhere classified  Localized edema     Problem List Patient Active Problem List   Diagnosis Date Noted  . Morbid obesity ([place] 08/05/2017  . OSA (obstructive sleep apnea) 04/05/2017  . S/P right knee arthroscopy 03/29/2014   SRuben Im PT 03/01/19 9:20 AM Phone: [phone]: [phone] Singh10/15/2020, 9:20 AM  [facility] [address] [place] [address]Phone: [phone]  Fax:  [phone] Name: MCHOSEN GESKEMRN: [id number] of Birth: 03/24/1961/02/03

## 2019-03-06 ENCOUNTER — Ambulatory Visit: Payer: 59

## 2019-03-08 ENCOUNTER — Other Ambulatory Visit: Payer: Self-pay

## 2019-03-08 ENCOUNTER — Ambulatory Visit: Payer: 59

## 2019-03-08 DIAGNOSIS — R262 Difficulty in walking, not elsewhere classified: Secondary | ICD-10-CM

## 2019-03-08 DIAGNOSIS — M25662 Stiffness of left knee, not elsewhere classified: Secondary | ICD-10-CM

## 2019-03-08 DIAGNOSIS — R600 Localized edema: Secondary | ICD-10-CM

## 2019-03-08 DIAGNOSIS — M25562 Pain in left knee: Secondary | ICD-10-CM | POA: Diagnosis not present

## 2019-03-08 DIAGNOSIS — M6281 Muscle weakness (generalized): Secondary | ICD-10-CM | POA: Diagnosis not present

## 2019-03-08 NOTE — Therapy (Signed)
[facility] [address], [place], [place], [address] Phone: [phone]   Fax:  [phone]  Physical Therapy Treatment  Patient Details  Name: David Newman MRN: [id number] Date of Birth: 03-18-1961 Referring Provider (PT): Dr. Sydnee Cabal    Encounter Date: 03/08/2019  PT End of Session - 03/08/19 0807    Visit Number  20    Date for PT Re-Evaluation  03/29/19    Authorization Type  UMR    PT Start Time  0728    PT Stop Time  0820    PT Time Calculation (min)  52 min    Activity Tolerance  Patient tolerated treatment well    Behavior During Therapy  [facility] for tasks assessed/performed       Past Medical History:  Diagnosis Date  . Anal fissure   . At risk for sleep apnea    STOP-BANG= 5    SENT TO PCP 03-27-2014  . CAD (coronary artery disease)   . Colon polyps    hyperplastic  . GERD (gastroesophageal reflux disease)   . Hyperlipidemia   . OA (osteoarthritis) of knee   . Right knee meniscal tear   . Sleep apnea    Wears CPAP.  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 Wears contact lenses   . Wears glasses     Past Surgical History:  Procedure Laterality Date  . COLONOSCOPY  04-24-2012  . FACIAL RECONSTRUCTION SURGERY  1973  age 69  . KNEE ARTHROSCOPY Right 03/29/2014   Procedure: RIGHT ARTHROSCOPY KNEE WITH DEBRIDMENT PARTIAL medial lateral MENISCECTOMY AND CHONDROPLASTY;  Surgeon: Sydnee Cabal, MD;  Location: [place];  Service: Orthopedics;  Laterality: Right;  . KNEE ARTHROSCOPY WITH ANTERIOR CRUCIATE LIGAMENT (ACL) REPAIR Right 1994  . KNEE ARTHROSCOPY WITH MEDIAL MENISECTOMY Left 12/28/2018   Procedure: KNEE ARTHROSCOPY WITH partial MEDIAL MENISECTOMY, debridement chondroplasty;  Surgeon: Sydnee Cabal, MD;  Location: [facility];  Service: Orthopedics;  Laterality: Left;  with knee block  . WISDOM TOOTH EXTRACTION  age 31    There were no vitals  filed for this visit.  Subjective Assessment - 03/08/19 0735    Subjective  I am able to walk 1.2 miles without limitation now.  I have increased speed and gait pattern.    Currently in Pain?  Yes    Pain Score  2     Pain Location  Knee    Pain Orientation  Left    Pain Descriptors / Indicators  Sore    Pain Type  Surgical pain    Pain Onset  More than a month ago    Pain Frequency  Intermittent    Aggravating Factors   walking longer period    Pain Relieving Factors  ice, rest, stretching                       [facility] Adult PT Treatment/Exercise - 03/08/19 0001      Lumbar Exercises: Machines for Strengthening   Leg Press  Seat 8 130# 2 x10 BLE 2x20; Seat 8 Lt 80# 2x10 reps       Knee/Hip Exercises: Stretches   Industrial/product designer Limitations  slant board on floor       Knee/Hip Exercises: Aerobic   Stationary Bike  L4 8 min while discussing status       Knee/Hip Exercises: Standing   Forward Step Up  Left;20 reps;Hand Hold: 0;Step Height: 6"    Forward Step Up Limitations  bosu ball    SLS with Vectors  floor sliders abduction 10x right/left     Walking with Sports Cord  35# sidestepping x 10 each. forward x 10    Other Standing Knee Exercises  high step ladder walk 3 laps     Other Standing Knee Exercises  4 stepping stones walking on soft pods 7x      Vasopneumatic   Number Minutes Vasopneumatic   15 minutes    Vasopnuematic Location   Knee    Vasopneumatic Pressure  Medium    Vasopneumatic Temperature   3 snowflakes               PT Short Term Goals - 01/17/19 0859      PT SHORT TERM GOAL #1   Title  Pt will demo consistency and independence with his initial HEP to improve ROM and muscle activation    Status  Achieved      PT SHORT TERM GOAL #2   Title  The patient will be able to walk 10-15 minutes for short community ambulation for grocery shopping    Baseline  able to walk 30 minutes in the grocery  store    Status  Achieved      PT [place] #3   Title  The patient will have improved left knee ROM 0-138 needed for greater ease getting in/out of the car    Baseline  131 degrees    Time  4    Period  Weeks    Status  On-going      PT SHORT TERM GOAL #4   Title  The patient will have decreased edema left knee to within 1/2 cm to 1 cm difference to right    Baseline  1/2 inch difference    Time  4    Period  Weeks    Status  On-going        PT Long Term Goals - 03/08/19 0736      PT LONG TERM GOAL #1   Title  Pt will be independent with his advanced HEP to allow for continued progress and maintenance of his progress after d/c from PT.     Time  6    Period  Weeks    Status  On-going      PT LONG TERM GOAL #2   Title  Pt will report at least 70% improvement in knee ROM, strength and mobility which will allow him to resume his prior recreational activity, household chores and work tasks    Baseline  65% improvement    Time  6    Period  Weeks    Status  On-going      PT LONG TERM GOAL #4   Title  The patient will be able to walk 1 mile needed for general health and leisure activity    Baseline  1.2 miles    Status  Achieved            Plan - 03/08/19 0744    Clinical Impression Statement  Pt reports 65% overall improvement in symptoms since the start of care.  Pt is now able to walk 1.2 miles without limitation with increased speed and gait pattern.  Pt demonstrates improved Lt knee strength this week and improved stability with soft surface walking.  Pt required minor verbal cues for alignment with eccentric motion.  Pt will continue to  benefit from skilled PT for Lt knee strength, flexibility, proprioception and endurance.    PT Frequency  2x / week    PT Duration  6 weeks    PT Treatment/Interventions  ADLs/Self Care Home Management;Aquatic Therapy;Cryotherapy;Advice worker;Therapeutic activities;Therapeutic exercise;Iontophoresis  48m/ml Dexamethasone;Patient/family education;Manual techniques;Taping    PT Next Visit Plan  Lt knee strength, flexibility, and proprioception    PT Home Exercise Plan  [email or web address]    Consulted and Agree with Plan of Care  Patient       Patient will benefit from skilled therapeutic intervention in order to improve the following deficits and impairments:  Abnormal gait, Difficulty walking, Decreased range of motion, Pain, Decreased strength, Increased edema, Decreased activity tolerance  Visit Diagnosis: Acute pain of left knee  Muscle weakness (generalized)  Stiffness of left knee, not elsewhere classified  Difficulty in walking, not elsewhere classified  Localized edema     Problem List Patient Active Problem List   Diagnosis Date Noted  . Morbid obesity ([place] 08/05/2017  . OSA (obstructive sleep apnea) 04/05/2017  . S/P right knee arthroscopy 03/29/2014     KSigurd Sos PT 03/08/19 8:09 AM  [facility] [address] [place] [address]Phone: [phone]  Fax:  [phone] Name: David KLIMASZEWSKIMRN: [id number] of Birth: 01/12/1961-09-19

## 2019-03-13 ENCOUNTER — Ambulatory Visit: Payer: 59

## 2019-03-13 ENCOUNTER — Other Ambulatory Visit: Payer: Self-pay

## 2019-03-13 DIAGNOSIS — R262 Difficulty in walking, not elsewhere classified: Secondary | ICD-10-CM

## 2019-03-13 DIAGNOSIS — M25562 Pain in left knee: Secondary | ICD-10-CM | POA: Diagnosis not present

## 2019-03-13 DIAGNOSIS — M6281 Muscle weakness (generalized): Secondary | ICD-10-CM | POA: Diagnosis not present

## 2019-03-13 DIAGNOSIS — M25662 Stiffness of left knee, not elsewhere classified: Secondary | ICD-10-CM

## 2019-03-13 DIAGNOSIS — R600 Localized edema: Secondary | ICD-10-CM | POA: Diagnosis not present

## 2019-03-13 NOTE — Therapy (Signed)
[facility] [address], [place], [place], [address] Phone: [phone]   Fax:  [phone]  Physical Therapy Treatment  Patient Details  Name: David Newman MRN: [id number] Date of Birth: 05-Nov-1960 Referring Provider (PT): Dr. Sydnee Cabal    Encounter Date: 03/13/2019  PT End of Session - 03/13/19 0812    Visit Number  21    Date for PT Re-Evaluation  03/29/19    Authorization Type  UMR    PT Start Time  0731    PT Stop Time  0826    PT Time Calculation (min)  55 min    Activity Tolerance  Patient tolerated treatment well    Behavior During Therapy  [facility] for tasks assessed/performed       Past Medical History:  Diagnosis Date  . Anal fissure   . At risk for sleep apnea    STOP-BANG= 5    SENT TO PCP 03-27-2014  . CAD (coronary artery disease)   . Colon polyps    hyperplastic  . GERD (gastroesophageal reflux disease)   . Hyperlipidemia   . OA (osteoarthritis) of knee   . Right knee meniscal tear   . Sleep apnea    Wears CPAP.  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 Wears contact lenses   . Wears glasses     Past Surgical History:  Procedure Laterality Date  . COLONOSCOPY  04-24-2012  . FACIAL RECONSTRUCTION SURGERY  1973  age 2  . KNEE ARTHROSCOPY Right 03/29/2014   Procedure: RIGHT ARTHROSCOPY KNEE WITH DEBRIDMENT PARTIAL medial lateral MENISCECTOMY AND CHONDROPLASTY;  Surgeon: Sydnee Cabal, MD;  Location: [place];  Service: Orthopedics;  Laterality: Right;  . KNEE ARTHROSCOPY WITH ANTERIOR CRUCIATE LIGAMENT (ACL) REPAIR Right 1994  . KNEE ARTHROSCOPY WITH MEDIAL MENISECTOMY Left 12/28/2018   Procedure: KNEE ARTHROSCOPY WITH partial MEDIAL MENISECTOMY, debridement chondroplasty;  Surgeon: Sydnee Cabal, MD;  Location: [facility];  Service: Orthopedics;  Laterality: Left;  with knee block  . WISDOM TOOTH EXTRACTION  age 2    There were no vitals  filed for this visit.  Subjective Assessment - 03/13/19 0735    Subjective  I leave tomorrow to go fishing.  The ball of my Lt foot is bothering me now.  I am not sure what caused it.    Patient Stated Goals  Get back to where I was;  walk 1-2 miles a day; ride a bike; some level of exercise, push mower; return to trout fishing; kayak    Currently in Pain?  Yes    Pain Score  1     Pain Location  Knee    Pain Orientation  Right    Pain Descriptors / Indicators  Sore    Pain Type  Surgical pain    Pain Onset  More than a month ago    Pain Frequency  Intermittent    Aggravating Factors   walking longer periods    Pain Relieving Factors  ice, rest, stretching                       [facility] Adult PT Treatment/Exercise - 03/13/19 0001      Lumbar Exercises: Machines for Strengthening   Leg Press  Seat 8 130# 2 x10 BLE 2x20; Seat 8 Lt 80# 2x10 reps       Knee/Hip Exercises: Stretches   Gastroc Stretch  Right;Left;3 reps      Knee/Hip  Exercises: Aerobic   Stationary Bike  L4 8 min while discussing status       Knee/Hip Exercises: Standing   Walking with Sports Cord  35# sidestepping x 10 each. forward x 10    Other Standing Knee Exercises  high step ladder walk 3 laps     Other Standing Knee Exercises  4 stepping stones walking on soft pods 7x- alternating directions       Vasopneumatic   Number Minutes Vasopneumatic   15 minutes    Vasopnuematic Location   Knee    Vasopneumatic Pressure  Medium    Vasopneumatic Temperature   3 snowflakes               PT Short Term Goals - 01/17/19 0859      PT SHORT TERM GOAL #1   Title  Pt will demo consistency and independence with his initial HEP to improve ROM and muscle activation    Status  Achieved      PT SHORT TERM GOAL #2   Title  The patient will be able to walk 10-15 minutes for short community ambulation for grocery shopping    Baseline  able to walk 30 minutes in the grocery store    Status  Achieved       PT [place] #3   Title  The patient will have improved left knee ROM 0-138 needed for greater ease getting in/out of the car    Baseline  131 degrees    Time  4    Period  Weeks    Status  On-going      PT SHORT TERM GOAL #4   Title  The patient will have decreased edema left knee to within 1/2 cm to 1 cm difference to right    Baseline  1/2 inch difference    Time  4    Period  Weeks    Status  On-going        PT Long Term Goals - 03/08/19 0736      PT LONG TERM GOAL #1   Title  Pt will be independent with his advanced HEP to allow for continued progress and maintenance of his progress after d/c from PT.     Time  6    Period  Weeks    Status  On-going      PT LONG TERM GOAL #2   Title  Pt will report at least 70% improvement in knee ROM, strength and mobility which will allow him to resume his prior recreational activity, household chores and work tasks    Baseline  65% improvement    Time  6    Period  Weeks    Status  On-going      PT LONG TERM GOAL #4   Title  The patient will be able to walk 1 mile needed for general health and leisure activity    Baseline  1.2 miles    Status  Achieved            Plan - 03/13/19 0814    Clinical Impression Statement  Pt presents today with new onset of Lt foot pain of unknown cause.  Pt reports 0-2/10 Lt knee pain with daily activity.  Pt is able to ambulate 1.2 miles regularly without limitation.  Pt reports 65-70% overall improvement in Lt knee symptoms and function.  Pt demonstrates improved Lt knee strength and improved stability with soft surface walking.  Pt required minor verbal cues  for alignment with eccentric motion.  Pt will continue to benefit from skilled PT for Lt knee strength, flexibility, proprioception and endurance    PT Frequency  2x / week    PT Duration  6 weeks    PT Treatment/Interventions  ADLs/Self Care Home Management;Aquatic Therapy;Cryotherapy;TEFL teacher;Therapeutic activities;Therapeutic exercise;Iontophoresis 33m/ml Dexamethasone;Patient/family education;Manual techniques;Taping    PT Next Visit Plan  Lt knee strength, flexibility, and proprioception    PT Home Exercise Plan  [email or web address]    Consulted and Agree with Plan of Care  Patient       Patient will benefit from skilled therapeutic intervention in order to improve the following deficits and impairments:  Abnormal gait, Difficulty walking, Decreased range of motion, Pain, Decreased strength, Increased edema, Decreased activity tolerance  Visit Diagnosis: Acute pain of left knee  Muscle weakness (generalized)  Stiffness of left knee, not elsewhere classified  Difficulty in walking, not elsewhere classified  Localized edema     Problem List Patient Active Problem List   Diagnosis Date Noted  . Morbid obesity ([place] 08/05/2017  . OSA (obstructive sleep apnea) 04/05/2017  . S/P right knee arthroscopy 03/29/2014    KSigurd Sos PT 03/13/19 8:16 AM  [facility] [address] [place] [address]Phone: [phone]  Fax:  [phone] Name: David NGUYENTHIMRN: [id number] of Birth: [birth year]

## 2019-03-20 ENCOUNTER — Encounter: Payer: Self-pay | Admitting: Physical Therapy

## 2019-03-20 ENCOUNTER — Other Ambulatory Visit: Payer: Self-pay

## 2019-03-20 ENCOUNTER — Ambulatory Visit: Payer: 59 | Attending: Specialist | Admitting: Physical Therapy

## 2019-03-20 DIAGNOSIS — M6281 Muscle weakness (generalized): Secondary | ICD-10-CM | POA: Diagnosis not present

## 2019-03-20 DIAGNOSIS — M25662 Stiffness of left knee, not elsewhere classified: Secondary | ICD-10-CM

## 2019-03-20 DIAGNOSIS — R600 Localized edema: Secondary | ICD-10-CM | POA: Diagnosis not present

## 2019-03-20 DIAGNOSIS — M25562 Pain in left knee: Secondary | ICD-10-CM

## 2019-03-20 DIAGNOSIS — R262 Difficulty in walking, not elsewhere classified: Secondary | ICD-10-CM

## 2019-03-20 NOTE — Therapy (Signed)
[facility] [address], [place], [place], [address] Phone: [phone]   Fax:  [phone]  Physical Therapy Treatment  Patient Details  Name: David Newman MRN: [id number] Date of Birth: 1961-03-28 Referring Provider (PT): Dr. Sydnee Cabal    Encounter Date: 03/20/2019  PT End of Session - 03/20/19 0738    Visit Number  22    Date for PT Re-Evaluation  03/29/19    Authorization Type  UMR    PT Start Time  0738    PT Stop Time  0820    PT Time Calculation (min)  42 min    Activity Tolerance  Patient tolerated treatment well    Behavior During Therapy  [facility] for tasks assessed/performed       Past Medical History:  Diagnosis Date  . Anal fissure   . At risk for sleep apnea    STOP-BANG= 5    SENT TO PCP 03-27-2014  . CAD (coronary artery disease)   . Colon polyps    hyperplastic  . GERD (gastroesophageal reflux disease)   . Hyperlipidemia   . OA (osteoarthritis) of knee   . Right knee meniscal tear   . Sleep apnea    Wears CPAP.  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 Wears contact lenses   . Wears glasses     Past Surgical History:  Procedure Laterality Date  . COLONOSCOPY  04-24-2012  . FACIAL RECONSTRUCTION SURGERY  1973  age 82  . KNEE ARTHROSCOPY Right 03/29/2014   Procedure: RIGHT ARTHROSCOPY KNEE WITH DEBRIDMENT PARTIAL medial lateral MENISCECTOMY AND CHONDROPLASTY;  Surgeon: Sydnee Cabal, MD;  Location: [place];  Service: Orthopedics;  Laterality: Right;  . KNEE ARTHROSCOPY WITH ANTERIOR CRUCIATE LIGAMENT (ACL) REPAIR Right 1994  . KNEE ARTHROSCOPY WITH MEDIAL MENISECTOMY Left 12/28/2018   Procedure: KNEE ARTHROSCOPY WITH partial MEDIAL MENISECTOMY, debridement chondroplasty;  Surgeon: Sydnee Cabal, MD;  Location: [facility];  Service: Orthopedics;  Laterality: Left;  with knee block  . WISDOM TOOTH EXTRACTION  age 81    There were no vitals  filed for this visit.  Subjective Assessment - 03/20/19 0740    Subjective  Knee is good but my big toe is hurting since yesterday. Thinking about seeing MD about it.    Pertinent History  right knee ACL and scope;  weakness from L5-S1 left LE had previous PT last Winter at [facility];  colitis;  patient notes preoperative muscle atrophy in thigh;  left knee scope 12/28/18    Currently in Pain?  Yes   Great Toe Lt   Pain Score  5     Pain Orientation  Left    Pain Descriptors / Indicators  Sore    Multiple Pain Sites  No                       [facility] Adult PT Treatment/Exercise - 03/20/19 0001      Knee/Hip Exercises: Aerobic   Stationary Bike  L4 x 8 min end tx since pt was late      Knee/Hip Exercises: Standing   Forward Step Up  Left;1 set;20 reps;Hand Hold: 0    Forward Step Up Limitations  Bosu Ball forward & side step    Walking with Sports Cord  35# sidestepping x 10 each. forward/backwards  x 10      Vasopneumatic   Number Minutes Vasopneumatic   10 minutes    Vasopnuematic Location  Knee    Vasopneumatic Pressure  Medium    Vasopneumatic Temperature   3 snowflakes               PT Short Term Goals - 01/17/19 0859      PT SHORT TERM GOAL #1   Title  Pt will demo consistency and independence with his initial HEP to improve ROM and muscle activation    Status  Achieved      PT SHORT TERM GOAL #2   Title  The patient will be able to walk 10-15 minutes for short community ambulation for grocery shopping    Baseline  able to walk 30 minutes in the grocery store    Status  Achieved      PT [place] #3   Title  The patient will have improved left knee ROM 0-138 needed for greater ease getting in/out of the car    Baseline  131 degrees    Time  4    Period  Weeks    Status  On-going      PT SHORT TERM GOAL #4   Title  The patient will have decreased edema left knee to within 1/2 cm to 1 cm difference to right    Baseline  1/2 inch difference     Time  4    Period  Weeks    Status  On-going        PT Long Term Goals - 03/08/19 0736      PT LONG TERM GOAL #1   Title  Pt will be independent with his advanced HEP to allow for continued progress and maintenance of his progress after d/c from PT.     Time  6    Period  Weeks    Status  On-going      PT LONG TERM GOAL #2   Title  Pt will report at least 70% improvement in knee ROM, strength and mobility which will allow him to resume his prior recreational activity, household chores and work tasks    Baseline  65% improvement    Time  6    Period  Weeks    Status  On-going      PT LONG TERM GOAL #4   Title  The patient will be able to walk 1 mile needed for general health and leisure activity    Baseline  1.2 miles    Status  Achieved            Plan - 03/20/19 0743    Clinical Impression Statement  Pt late for session. Knee doing well, Lt great toe hurting more last 24 hours. Lateral step ups on BOSU were challenging to pt's balance and over all stability. Pt mentioned speaking with Dr Theda Sers next week to rule out Gout VS L5 nerve root issue regaridng his great toe pain.    Personal Factors and Comorbidities  Past/Current Experience;Comorbidity 1;Comorbidity 2    Comorbidities  history of right knee pathology;  history LBP    Examination-Activity Limitations  Locomotion Level;Lift;Stairs;Squat;Carry;Stand    Examination-Participation Restrictions  Meal Prep;Cleaning;Community Activity    Stability/Clinical Decision Making  Stable/Uncomplicated    Rehab Potential  Good    PT Frequency  2x / week    PT Duration  6 weeks    PT Treatment/Interventions  ADLs/Self Care Home Management;Aquatic Therapy;Cryotherapy;Advice worker;Therapeutic activities;Therapeutic exercise;Iontophoresis 66m/ml Dexamethasone;Patient/family education;Manual techniques;Taping    PT Next Visit Plan  Lt knee strength, flexibility, and proprioception  PT Home Exercise Plan   [email or web address]    Consulted and Agree with Plan of Care  Patient       Patient will benefit from skilled therapeutic intervention in order to improve the following deficits and impairments:  Abnormal gait, Difficulty walking, Decreased range of motion, Pain, Decreased strength, Increased edema, Decreased activity tolerance  Visit Diagnosis: Acute pain of left knee  Muscle weakness (generalized)  Stiffness of left knee, not elsewhere classified  Difficulty in walking, not elsewhere classified  Localized edema     Problem List Patient Active Problem List   Diagnosis Date Noted  . Morbid obesity ([place]) 08/05/2017  . OSA (obstructive sleep apnea) 04/05/2017  . S/P right knee arthroscopy 03/29/2014    COCHRAN,JENNIFER,PTA 03/20/2019, 8:05 AM  [facility] [address], [place], [place], [address] Phone: [phone]   Fax:  [phone]  Name: David Newman MRN: [id number] Date of Birth: 10-Feb-1961

## 2019-03-21 DIAGNOSIS — M2540 Effusion, unspecified joint: Secondary | ICD-10-CM | POA: Diagnosis not present

## 2019-03-22 ENCOUNTER — Ambulatory Visit: Payer: 59 | Admitting: Physical Therapy

## 2019-03-22 ENCOUNTER — Other Ambulatory Visit: Payer: Self-pay

## 2019-03-22 ENCOUNTER — Encounter: Payer: Self-pay | Admitting: Physical Therapy

## 2019-03-22 DIAGNOSIS — R262 Difficulty in walking, not elsewhere classified: Secondary | ICD-10-CM | POA: Diagnosis not present

## 2019-03-22 DIAGNOSIS — R600 Localized edema: Secondary | ICD-10-CM

## 2019-03-22 DIAGNOSIS — M25662 Stiffness of left knee, not elsewhere classified: Secondary | ICD-10-CM | POA: Diagnosis not present

## 2019-03-22 DIAGNOSIS — M6281 Muscle weakness (generalized): Secondary | ICD-10-CM | POA: Diagnosis not present

## 2019-03-22 DIAGNOSIS — M25562 Pain in left knee: Secondary | ICD-10-CM

## 2019-03-22 NOTE — Therapy (Signed)
[facility] [address] [place], [place], [address] Phone: [phone]   Fax:  [phone]  Physical Therapy Treatment  Patient Details  Name: David Newman MRN: [id number] Date of Birth: 07/11/1960 Referring Provider (PT): Dr. Sydnee Cabal    Encounter Date: 03/22/2019  PT End of Session - 03/22/19 1726    Visit Number  23    Date for PT Re-Evaluation  03/29/19    Authorization Type  UMR    PT Start Time  0733    PT Stop Time  0811    PT Time Calculation (min)  38 min    Activity Tolerance  Patient tolerated treatment well       Past Medical History:  Diagnosis Date  . Anal fissure   . At risk for sleep apnea    STOP-BANG= 5    SENT TO PCP 03-27-2014  . CAD (coronary artery disease)   . Colon polyps    hyperplastic  . GERD (gastroesophageal reflux disease)   . Hyperlipidemia   . OA (osteoarthritis) of knee   . Right knee meniscal tear   . Sleep apnea    Wears CPAP.  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 Wears contact lenses   . Wears glasses     Past Surgical History:  Procedure Laterality Date  . COLONOSCOPY  04-24-2012  . FACIAL RECONSTRUCTION SURGERY  1973  age 81  . KNEE ARTHROSCOPY Right 03/29/2014   Procedure: RIGHT ARTHROSCOPY KNEE WITH DEBRIDMENT PARTIAL medial lateral MENISCECTOMY AND CHONDROPLASTY;  Surgeon: Sydnee Cabal, MD;  Location: [facility];  Service: Orthopedics;  Laterality: Right;  . KNEE ARTHROSCOPY WITH ANTERIOR CRUCIATE LIGAMENT (ACL) REPAIR Right 1994  . KNEE ARTHROSCOPY WITH MEDIAL MENISECTOMY Left 12/28/2018   Procedure: KNEE ARTHROSCOPY WITH partial MEDIAL MENISECTOMY, debridement chondroplasty;  Surgeon: Sydnee Cabal, MD;  Location: [facility];  Service: Orthopedics;  Laterality: Left;  with knee block  . WISDOM TOOTH EXTRACTION  age 56    There were no vitals filed for this visit.  Subjective Assessment - 03/22/19 0732     Subjective  I'm doing alright. My knee held up OK on my trip but I didn't get to fish much.  Fished Saturday and Sunday.   Did a couple mile hike.  I wore my knee brace the first day.  It got tired.  Got tested for gout in toe.    Pertinent History  right knee ACL and scope;  weakness from L5-S1 left LE had previous PT last Winter at [facility];  colitis;  patient notes preoperative muscle atrophy in thigh;  left knee scope 12/28/18    Patient Stated Goals  Get back to where I was;  walk 1-2 miles a day; ride a bike; some level of exercise, push mower; return to trout fishing; kayak    Currently in Pain?  No/denies    Pain Score  0-No pain    Pain Location  Knee    Pain Orientation  Left    Pain Type  Surgical pain                       [facility] Adult PT Treatment/Exercise - 03/22/19 0001      Knee/Hip Exercises: Stretches   Active Hamstring Stretch  Right;Left;5 reps    Active Hamstring Stretch Limitations  on 2nd step     Hip Flexor Stretch  Right;Left;5 reps    Hip Flexor Stretch Limitations  on 2nd step with UE movements to increase stretch       Knee/Hip Exercises: Aerobic   Stationary Bike  L4 7 min while discussing status       Knee/Hip Exercises: Standing   Forward Step Up  Left;20 reps;Hand Hold: 0;Step Height: 6"    Forward Step Up Limitations  right foot tap to chair     Step Down Limitations  3 point step down from 2 inch step; 2 inch step down 20x no UE support     SLS  4 ways with green band 10x each way      Vasopneumatic   Number Minutes Vasopneumatic   10 minutes    Vasopnuematic Location   Knee    Vasopneumatic Pressure  Medium    Vasopneumatic Temperature   3 snowflakes               PT Short Term Goals - 01/17/19 0859      PT SHORT TERM GOAL #1   Title  Pt will demo consistency and independence with his initial HEP to improve ROM and muscle activation    Status  Achieved      PT SHORT TERM GOAL #2   Title  The patient will be able to walk  10-15 minutes for short community ambulation for grocery shopping    Baseline  able to walk 30 minutes in the grocery store    Status  Achieved      PT SHORT TERM GOAL #3   Title  The patient will have improved left knee ROM 0-138 needed for greater ease getting in/out of the car    Baseline  131 degrees    Time  4    Period  Weeks    Status  On-going      PT SHORT TERM GOAL #4   Title  The patient will have decreased edema left knee to within 1/2 cm to 1 cm difference to right    Baseline  1/2 inch difference    Time  4    Period  Weeks    Status  On-going        PT Long Term Goals - 03/08/19 0736      PT LONG TERM GOAL #1   Title  Pt will be independent with his advanced HEP to allow for continued progress and maintenance of his progress after d/c from PT.     Time  6    Period  Weeks    Status  On-going      PT LONG TERM GOAL #2   Title  Pt will report at least 70% improvement in knee ROM, strength and mobility which will allow him to resume his prior recreational activity, household chores and work tasks    Baseline  65% improvement    Time  6    Period  Weeks    Status  On-going      PT LONG TERM GOAL #4   Title  The patient will be able to walk 1 mile needed for general health and leisure activity    Baseline  1.2 miles    Status  Achieved            Plan - 03/22/19 1727    Clinical Impression Statement  The patient is improving with quad, HS and gluteal strengthening with increased repetitions with less UE support needed.  Minimal cues to avoid hyperextending knee and encouraging slight knee bend for muscle activation.  Improved gait  pattern with improved stance time and heel strike.  On track to meet remaining goals in 2-3 visits.    Rehab Potential  Good    PT Frequency  2x / week    PT Duration  6 weeks    PT Treatment/Interventions  ADLs/Self Care Home Management;Aquatic Therapy;Cryotherapy;Advice worker;Therapeutic  activities;Therapeutic exercise;Iontophoresis 67m/ml Dexamethasone;Patient/family education;Manual techniques;Taping    PT Next Visit Plan  Lt knee strength, flexibility, and proprioception;  vasocompression    PT Home Exercise Plan  [place]      Patient will benefit from skilled therapeutic intervention in order to improve the following deficits and impairments:  Abnormal gait, Difficulty walking, Decreased range of motion, Pain, Decreased strength, Increased edema, Decreased activity tolerance  Visit Diagnosis: Acute pain of left knee  Muscle weakness (generalized)  Stiffness of left knee, not elsewhere classified  Difficulty in walking, not elsewhere classified  Localized edema     Problem List Patient Active Problem List   Diagnosis Date Noted  . Morbid obesity ([place] 08/05/2017  . OSA (obstructive sleep apnea) 04/05/2017  . S/P right knee arthroscopy 03/29/2014   SRuben Im PT 03/22/19 5:32 PM Phone: [phone]: [phone] Singh11/09/2018, 5:32 PM  [facility] [address] [place] [address]Phone: [phone]  Fax:  [phone] Name: MBRELAND TROUTENMRN: [id number] of Birth: 7Jul 11, 1962

## 2019-03-27 ENCOUNTER — Encounter: Payer: Self-pay | Admitting: Physical Therapy

## 2019-03-27 ENCOUNTER — Ambulatory Visit: Payer: 59 | Admitting: Physical Therapy

## 2019-03-27 ENCOUNTER — Other Ambulatory Visit: Payer: Self-pay

## 2019-03-27 DIAGNOSIS — R600 Localized edema: Secondary | ICD-10-CM

## 2019-03-27 DIAGNOSIS — M25562 Pain in left knee: Secondary | ICD-10-CM

## 2019-03-27 DIAGNOSIS — M6281 Muscle weakness (generalized): Secondary | ICD-10-CM | POA: Diagnosis not present

## 2019-03-27 DIAGNOSIS — R262 Difficulty in walking, not elsewhere classified: Secondary | ICD-10-CM

## 2019-03-27 DIAGNOSIS — M25662 Stiffness of left knee, not elsewhere classified: Secondary | ICD-10-CM | POA: Diagnosis not present

## 2019-03-27 NOTE — Therapy (Signed)
[facility] [address] [place], [place], [address] Phone: [phone]   Fax:  [phone]  Physical Therapy Treatment  Patient Details  Name: David Newman MRN: [id number] Date of Birth: [birth year] Referring Provider (PT): Dr. Sydnee Cabal    Encounter Date: 03/27/2019  PT End of Session - 03/27/19 0945    Visit Number  24    Date for PT Re-Evaluation  03/29/19    Authorization Type  UMR    PT Start Time  0733    PT Stop Time  0814    PT Time Calculation (min)  41 min    Activity Tolerance  Patient tolerated treatment well       Past Medical History:  Diagnosis Date  . Anal fissure   . At risk for sleep apnea    STOP-BANG= 5    SENT TO PCP 03-27-2014  . CAD (coronary artery disease)   . Colon polyps    hyperplastic  . GERD (gastroesophageal reflux disease)   . Hyperlipidemia   . OA (osteoarthritis) of knee   . Right knee meniscal tear   . Sleep apnea    Wears CPAP.  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 Wears contact lenses   . Wears glasses     Past Surgical History:  Procedure Laterality Date  . COLONOSCOPY  04-24-2012  . FACIAL RECONSTRUCTION SURGERY  1973  age 85  . KNEE ARTHROSCOPY Right 03/29/2014   Procedure: RIGHT ARTHROSCOPY KNEE WITH DEBRIDMENT PARTIAL medial lateral MENISCECTOMY AND CHONDROPLASTY;  Surgeon: Sydnee Cabal, MD;  Location: [facility];  Service: Orthopedics;  Laterality: Right;  . KNEE ARTHROSCOPY WITH ANTERIOR CRUCIATE LIGAMENT (ACL) REPAIR Right 1994  . KNEE ARTHROSCOPY WITH MEDIAL MENISECTOMY Left 12/28/2018   Procedure: KNEE ARTHROSCOPY WITH partial MEDIAL MENISECTOMY, debridement chondroplasty;  Surgeon: Sydnee Cabal, MD;  Location: [facility];  Service: Orthopedics;  Laterality: Left;  with knee block  . WISDOM TOOTH EXTRACTION  age 51    There were no vitals filed for this visit.  Subjective Assessment - 03/27/19 0735     Subjective  No complaints.   Got tests results + for gout in big  toe.    Pertinent History  right knee ACL and scope;  weakness from L5-S1 left LE had [facility] PT [facility] at [facility];  colitis;  patient notes preoperative muscle atrophy in thigh;  left knee scope 12/28/18    Currently in Pain?  No/denies    Pain Score  0-No pain    Pain Location  Knee    Pain Orientation  Left                       [facility] Adult PT Treatment/Exercise - 03/27/19 0001      Knee/Hip Exercises: Aerobic   Stationary Bike  L4 5 min while discussing status       Knee/Hip Exercises: Machines for Strengthening   Cybex Leg Press  seat 9 105# 20x bil  75# left only 15x2      Knee/Hip Exercises: Standing   Forward Step Up  Left;15 reps    Forward Step Up Limitations  holding 5# weight on right     Wall Squat  5 reps    Wall Squat Limitations  10 sec hold     Lunge Walking - Round Trips  holding 5# weight 10x right/left     SLS  SLS with reach forward 10x  SLS with Vectors  SLS with 5# weight pass hand to hand     Other Standing Knee Exercises  dynamic warm up: side step, high step, butt kicks; hip rotators       Vasopneumatic   Number Minutes Vasopneumatic   10 minutes    Vasopnuematic Location   Knee    Vasopneumatic Pressure  Medium    Vasopneumatic Temperature   3 snowflakes               PT Short Term Goals - 01/17/19 0859      PT SHORT TERM GOAL #1   Title  Pt will demo consistency and independence with his initial HEP to improve ROM and muscle activation    Status  Achieved      PT SHORT TERM GOAL #2   Title  The patient will be able to walk 10-15 minutes for short community ambulation for grocery shopping    Baseline  able to walk 30 minutes in the grocery store    Status  Achieved      PT [place] #3   Title  The patient will have improved left knee ROM 0-138 needed for greater ease getting in/out of the car    Baseline  131 degrees    Time  4    Period   Weeks    Status  On-going      PT SHORT TERM GOAL #4   Title  The patient will have decreased edema left knee to within 1/2 cm to 1 cm difference to right    Baseline  1/2 inch difference    Time  4    Period  Weeks    Status  On-going        PT Long Term Goals - 03/08/19 0736      PT LONG TERM GOAL #1   Title  Pt will be independent with his advanced HEP to allow for continued progress and maintenance of his progress after d/c from PT.     Time  6    Period  Weeks    Status  On-going      PT LONG TERM GOAL #2   Title  Pt will report at least 70% improvement in knee ROM, strength and mobility which will allow him to resume his prior recreational activity, household chores and work tasks    Baseline  65% improvement    Time  6    Period  Weeks    Status  On-going      PT LONG TERM GOAL #4   Title  The patient will be able to walk 1 mile needed for general health and leisure activity    Baseline  1.2 miles    Status  Achieved            Plan - 03/27/19 0945    Clinical Impression Statement  The patient reports medial knee discomfort and muscular fatigue with single leg standing exercises while maintaining bent knee.    He is able to add increased load/resistance to his exercises today.   His newly-diagnosed gout in his great toe was not a limiting factor.  Much improved gait pattern without limp today.  Anticipate readiness for discharge next visit.    Comorbidities  history of right knee pathology;  history LBP    Examination-Activity Limitations  Locomotion Level;Lift;Stairs;Squat;Carry;Stand    Rehab Potential  Good    PT Frequency  2x / week    PT Duration  6 weeks    PT Treatment/Interventions  ADLs/Self Care Home Management;Aquatic Therapy;Cryotherapy;Advice worker;Therapeutic activities;Therapeutic exercise;Iontophoresis 95m/ml Dexamethasone;Patient/family education;Manual techniques;Taping    PT Next Visit Plan  recheck knee ROM, MMT, knee  girth measurements, FOTO and progress toward goals for discharge from PT;  Lt knee strength, flexibility, and proprioception;  vasocompression    PT Home Exercise Plan  [place]      Patient will benefit from skilled therapeutic intervention in order to improve the following deficits and impairments:  Abnormal gait, Difficulty walking, Decreased range of motion, Pain, Decreased strength, Increased edema, Decreased activity tolerance  Visit Diagnosis: Acute pain of left knee  Muscle weakness (generalized)  Stiffness of left knee, not elsewhere classified  Difficulty in walking, not elsewhere classified  Localized edema     Problem List Patient Active Problem List   Diagnosis Date Noted  . Morbid obesity ([place] 08/05/2017  . OSA (obstructive sleep apnea) 04/05/2017  . S/P right knee arthroscopy 03/29/2014   SRuben Im PT 03/27/19 9:53 AM Phone: [phone]: [phone] Singh11/02/2019, 9:52 AM  [facility] [address] [place] [address]Phone: [phone]  Fax:  [phone] Name: David BOROWIAKMRN: [id number] of Birth: 710-11-1960

## 2019-03-29 ENCOUNTER — Ambulatory Visit: Payer: 59

## 2019-03-29 ENCOUNTER — Other Ambulatory Visit: Payer: Self-pay

## 2019-03-29 DIAGNOSIS — R600 Localized edema: Secondary | ICD-10-CM | POA: Diagnosis not present

## 2019-03-29 DIAGNOSIS — M25562 Pain in left knee: Secondary | ICD-10-CM | POA: Diagnosis not present

## 2019-03-29 DIAGNOSIS — M6281 Muscle weakness (generalized): Secondary | ICD-10-CM

## 2019-03-29 DIAGNOSIS — M25662 Stiffness of left knee, not elsewhere classified: Secondary | ICD-10-CM

## 2019-03-29 DIAGNOSIS — R262 Difficulty in walking, not elsewhere classified: Secondary | ICD-10-CM | POA: Diagnosis not present

## 2019-03-29 NOTE — Therapy (Signed)
[facility] [address] [place], [place], [address] Phone: [phone]   Fax:  [phone]  Physical Therapy Treatment  Patient Details  Name: David Newman MRN: [id number] Date of Birth: [birth year] Referring Provider (PT): Dr. Sydnee Cabal    Encounter Date: 03/29/2019  PT End of Session - 03/29/19 0806    Visit Number  25    PT Start Time  0736    PT Stop Time  0820    PT Time Calculation (min)  44 min    Activity Tolerance  Patient tolerated treatment well    Behavior During Therapy  [facility] for tasks assessed/performed       Past Medical History:  Diagnosis Date  . Anal fissure   . At risk for sleep apnea    STOP-BANG= 5    SENT TO PCP 03-27-2014  . CAD (coronary artery disease)   . Colon polyps    hyperplastic  . GERD (gastroesophageal reflux disease)   . Hyperlipidemia   . OA (osteoarthritis) of knee   . Right knee meniscal tear   . Sleep apnea    Wears CPAP.  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 Wears contact lenses   . Wears glasses     Past Surgical History:  Procedure Laterality Date  . COLONOSCOPY  04-24-2012  . FACIAL RECONSTRUCTION SURGERY  1973  age 69  . KNEE ARTHROSCOPY Right 03/29/2014   Procedure: RIGHT ARTHROSCOPY KNEE WITH DEBRIDMENT PARTIAL medial lateral MENISCECTOMY AND CHONDROPLASTY;  Surgeon: Sydnee Cabal, MD;  Location: [facility];  Service: Orthopedics;  Laterality: Right;  . KNEE ARTHROSCOPY WITH ANTERIOR CRUCIATE LIGAMENT (ACL) REPAIR Right 1994  . KNEE ARTHROSCOPY WITH MEDIAL MENISECTOMY Left 12/28/2018   Procedure: KNEE ARTHROSCOPY WITH partial MEDIAL MENISECTOMY, debridement chondroplasty;  Surgeon: Sydnee Cabal, MD;  Location: [facility];  Service: Orthopedics;  Laterality: Left;  with knee block  . WISDOM TOOTH EXTRACTION  age 5    There were no vitals filed for this visit.  Subjective Assessment - 03/29/19 0747    Subjective  Ready for D/C to HEP.  I need to be more diligent in HEP.    Currently in Pain?  No/denies         [facility] PT Assessment - 03/29/19 0001      Assessment   Medical Diagnosis  left knee scope    Referring Provider (PT)  Dr. Sydnee Cabal     Onset Date/Surgical Date  12/29/18      Precautions   Precautions  None      Observation/Other Assessments   Focus on Therapeutic Outcomes (FOTO)   37% limitation      Circumferential Edema   Circumferential - Left   16.5 inches      AROM   Left Knee Extension  0    Left Knee Flexion  144      Strength   Left Knee Flexion  5/5    Left Knee Extension  5/5                   [facility] Adult PT Treatment/Exercise - 03/29/19 0001      Knee/Hip Exercises: Aerobic   Stationary Bike  L4 7.5  min while discussing status       Knee/Hip Exercises: Machines for Strengthening   Cybex Leg Press  seat 9 105# 20x bil  75# left only 15x2      Knee/Hip Exercises: Standing   Forward  Step Up  Left;15 reps;Step Height: 6"    Forward Step Up Limitations  holding 5# weight on right     SLS  SLS with reach forward 10x     SLS with Vectors  SLS with 5# weight pass hand to hand       Vasopneumatic   Number Minutes Vasopneumatic   15 minutes    Vasopnuematic Location   Knee    Vasopneumatic Pressure  Medium    Vasopneumatic Temperature   3 snowflakes               PT Short Term Goals - 01/17/19 0859      PT SHORT TERM GOAL #1   Title  Pt will demo consistency and independence with his initial HEP to improve ROM and muscle activation    Status  Achieved      PT SHORT TERM GOAL #2   Title  The patient will be able to walk 10-15 minutes for short community ambulation for grocery shopping    Baseline  able to walk 30 minutes in the grocery store    Status  Achieved      PT SHORT TERM GOAL #3   Title  The patient will have improved left knee ROM 0-138 needed for greater ease getting in/out of the car    Baseline  131  degrees    Time  4    Period  Weeks    Status  On-going      PT SHORT TERM GOAL #4   Title  The patient will have decreased edema left knee to within 1/2 cm to 1 cm difference to right    Baseline  1/2 inch difference    Time  4    Period  Weeks    Status  On-going        PT Long Term Goals - 03/29/19 0741      PT LONG TERM GOAL #1   Title  Pt will be independent with his advanced HEP to allow for continued progress and maintenance of his progress after d/c from PT.     Status  Achieved      PT LONG TERM GOAL #2   Title  Pt will report at least 70% improvement in knee ROM, strength and mobility which will allow him to resume his prior recreational activity, household chores and work tasks    Status  Achieved      PT [place] #3   Title  The patient will have 4/5 knee strength and 5-/5 hip strength needed for reciprocal stair climbing, curb negotiation and stooping    Status  Achieved      PT LONG TERM GOAL #4   Title  The patient will be able to walk 1 mile needed for general health and leisure activity    Baseline  2 miles    Status  Achieved      PT LONG TERM GOAL #5   Title  FOTO functional outcome score will improve from 57% limitation to 31% limitation    Baseline  37% limitation    Status  Partially Met            Plan - 03/29/19 0814    Clinical Impression Statement  Pt will be discharged to HEP for continued strength and proprioception.  Pt is able to walk 2 miles without limitation.  FOTO is improved to 37% limitation and is limited with longer periods of walking and heavy housework.  Pt with improved Lt knee strength and A/ROM.  Pt will follow-up with MD as needed.    PT Next Visit Plan  D/C PT to HEP    PT Home Exercise Plan  [id number]    Consulted and Agree with Plan of Care  Patient       Patient will benefit from skilled therapeutic intervention in order to improve the following deficits and impairments:     Visit Diagnosis: Muscle weakness  (generalized)  Acute pain of left knee  Stiffness of left knee, not elsewhere classified  Localized edema  Difficulty in walking, not elsewhere classified     Problem List Patient Active Problem List   Diagnosis Date Noted  . Morbid obesity ([place]) 08/05/2017  . OSA (obstructive sleep apnea) 04/05/2017  . S/P right knee arthroscopy 03/29/2014    PHYSICAL THERAPY DISCHARGE SUMMARY  Visits from Start of Care: 25  Current functional level related to goals / functional outcomes: See above for current status.  Pt has HEP in place to address proprioception, strength and balance.     Remaining deficits: See above.  Pt with difficulty with longer distance walking and proprioceptive activities.     Education / Equipment: HEP Plan: Patient agrees to discharge.  Patient goals were partially met. Patient is being discharged due to being pleased with the current functional level.  ?????        Sigurd Sos, PT 03/29/19 8:16 AM  [facility] [address] [place], [place], [address] Phone: [phone]   Fax:  [phone]  Name: David Newman MRN: [id number] Date of Birth: Jan 03, 1961

## 2019-05-16 DIAGNOSIS — B353 Tinea pedis: Secondary | ICD-10-CM | POA: Diagnosis not present

## 2019-05-16 DIAGNOSIS — D1801 Hemangioma of skin and subcutaneous tissue: Secondary | ICD-10-CM | POA: Diagnosis not present

## 2019-05-16 DIAGNOSIS — D225 Melanocytic nevi of trunk: Secondary | ICD-10-CM | POA: Diagnosis not present

## 2019-05-16 DIAGNOSIS — L821 Other seborrheic keratosis: Secondary | ICD-10-CM | POA: Diagnosis not present

## 2019-05-16 DIAGNOSIS — L309 Dermatitis, unspecified: Secondary | ICD-10-CM | POA: Diagnosis not present

## 2019-05-16 DIAGNOSIS — D2262 Melanocytic nevi of left upper limb, including shoulder: Secondary | ICD-10-CM | POA: Diagnosis not present

## 2019-05-16 DIAGNOSIS — D2261 Melanocytic nevi of right upper limb, including shoulder: Secondary | ICD-10-CM | POA: Diagnosis not present

## 2019-06-20 ENCOUNTER — Telehealth: Payer: Self-pay | Admitting: Internal Medicine

## 2019-06-20 ENCOUNTER — Other Ambulatory Visit: Payer: Self-pay

## 2019-06-20 MED ORDER — PREDNISONE 10 MG PO TABS: ORAL_TABLET | ORAL | 1 refills | Status: DC

## 2019-06-20 NOTE — Telephone Encounter (Signed)
Pt aware, script sent to pharmacy. Pt scheduled to see Dr. Henrene Pastor 3/3@3pm , pt aware of appt.

## 2019-06-20 NOTE — Telephone Encounter (Signed)
1.  Initiate prednisone 40 mg daily.  Taper by 10 mg every 2 weeks 2.  Continue Lialda 3.  Add Rowasa enemas at night 4.  Okay to get vaccine as scheduled 5.  Office follow-up with me in 4 weeks.

## 2019-06-20 NOTE — Telephone Encounter (Signed)
Pt reported that he is having a UC flare up.  Please call back to discuss.

## 2019-06-20 NOTE — Telephone Encounter (Signed)
Pt thinks he is having a UC flare. States a few weeks ago around the time he received his 1st covid vaccine he noticed stool consistency change and little blood in stool. Reports this has progressively gotten worse. Reports this morning he had a BM and the stool was segmented and he had urgency. Also states there was mucous and BRB in the stool and some clots. He is taking lialda 4.8 gm daily. Pt is scheduled for second covid vaccine this Friday. Please advise.

## 2019-06-29 DIAGNOSIS — H5213 Myopia, bilateral: Secondary | ICD-10-CM | POA: Diagnosis not present

## 2019-06-29 DIAGNOSIS — H524 Presbyopia: Secondary | ICD-10-CM | POA: Diagnosis not present

## 2019-06-29 DIAGNOSIS — H52203 Unspecified astigmatism, bilateral: Secondary | ICD-10-CM | POA: Diagnosis not present

## 2019-07-18 ENCOUNTER — Ambulatory Visit (INDEPENDENT_AMBULATORY_CARE_PROVIDER_SITE_OTHER): Payer: 59 | Admitting: Internal Medicine

## 2019-07-18 ENCOUNTER — Other Ambulatory Visit: Payer: Self-pay

## 2019-07-18 ENCOUNTER — Encounter: Payer: Self-pay | Admitting: Internal Medicine

## 2019-07-18 VITALS — BP 156/80 | HR 80 | Temp 97.5°F | Ht 73.0 in | Wt 254.0 lb

## 2019-07-18 DIAGNOSIS — K51311 Ulcerative (chronic) rectosigmoiditis with rectal bleeding: Secondary | ICD-10-CM

## 2019-07-18 MED ORDER — MESALAMINE 1.2 G PO TBEC: 4.8000 g | DELAYED_RELEASE_TABLET | Freq: Every day | ORAL | 3 refills | Status: DC

## 2019-07-18 MED ORDER — MESALAMINE 4 G RE ENEM: ENEMA | RECTAL | 11 refills | Status: DC

## 2019-07-18 NOTE — Patient Instructions (Signed)
We have sent the following medications to your pharmacy for you to pick up at your convenience:  Rowasa, Lialda  Prednisone taper as directed by Dr. Henrene Pastor

## 2019-07-18 NOTE — Progress Notes (Signed)
HISTORY OF PRESENT ILLNESS:  David Newman is a 59 y.o. 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  He has been on oral and topical mesalamine as well as on-demand prednisone for flare of disease.  He was last seen September 14, 2018 after being treated for flare of disease.  He did well until about 1 month ago when he contacted the office with 7 to 10 days of recurrent symptoms of increased bowel frequency with bloody mucus as well as abdominal cramping.  Entirely consistent with flare of disease.  He was continued on his Lialda 4.8 g daily.  Rowasa enemas at night added.  Prednisone 40 mg daily initiated.  He continued this for 2 weeks then transition to 30 mg daily.  He has just completed 2 weeks at this dosage.  He presents today for follow-up.  He does mention that he has vaccination for Covid before developing symptoms.  He asks about potential correlation.  He was advised by myself to follow through with his second vaccination which he did.  No ill effects.  Currently describes 1 bowel movement per day without blood or mucus.  No abdominal pain.  Increased appetite with prednisone but otherwise well.  REVIEW OF SYSTEMS:  All non-GI ROS negative unless otherwise stated in the HPI.Marland Kitchen Past Medical History:  Diagnosis Date  . Anal fissure   . At risk for sleep apnea    STOP-BANG= 5    SENT TO PCP 03-27-2014  . CAD (coronary artery disease)   . Colon polyps    hyperplastic  . GERD (gastroesophageal reflux disease)   . Hyperlipidemia   . OA (osteoarthritis) of knee   . Right knee meniscal tear   . Sleep apnea    Wears CPAP.  Marland Kitchen 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[place])   . Wears contact lenses   . Wears glasses     Past Surgical History:  Procedure Laterality Date  . COLONOSCOPY  04-24-2012  . FACIAL RECONSTRUCTION SURGERY  1973  age 46  . KNEE ARTHROSCOPY Right 03/29/2014   Procedure: RIGHT ARTHROSCOPY KNEE WITH DEBRIDMENT PARTIAL medial  lateral MENISCECTOMY AND CHONDROPLASTY;  Surgeon: Sydnee Cabal, MD;  Location: [facility] [facility] [facility];  Service: Orthopedics;  Laterality: Right;  . KNEE ARTHROSCOPY WITH ANTERIOR CRUCIATE LIGAMENT (ACL) REPAIR Right 1994  . KNEE ARTHROSCOPY WITH MEDIAL MENISECTOMY Left 12/28/2018   Procedure: KNEE ARTHROSCOPY WITH partial MEDIAL MENISECTOMY, debridement chondroplasty;  Surgeon: Sydnee Cabal, MD;  Location: [facility];  Service: Orthopedics;  Laterality: Left;  with knee block  . WISDOM TOOTH EXTRACTION  age 7    Social History TOME WILSON  reports that he quit smoking about 12 years ago. His smoking use included cigarettes. He has a 35.00 pack-year smoking history. He has never used smokeless tobacco. He reports current alcohol use. He reports that he does not use drugs.  family history includes Alzheimer's disease in his mother; Colon cancer (age of onset: 67) in his cousin; Colon cancer (age of onset: 50) in his paternal uncle; Colon polyps (age of onset: 40) in his brother; Heart disease in his brother and father; Skin cancer in his father.  Allergies  Allergen Reactions  . Sulfa Antibiotics Nausea And Vomiting       PHYSICAL EXAMINATION: Vital signs: BP (!) 156/80   Pulse 80   Temp (!) 97.5 F (36.4 C)   Ht 6' 1"  (1.854 m)   Wt 254 lb (115.2 kg)   BMI  33.51 kg/m   Constitutional: generally well-appearing, no acute distress Psychiatric: alert and oriented x3, cooperative Eyes: extraocular movements intact, anicteric, conjunctiva pink Mouth: oral pharynx moist, no lesions Neck: supple no lymphadenopathy Cardiovascular: heart regular rate and rhythm, no murmur Lungs: clear to auscultation bilaterally Abdomen: soft, nontender, nondistended, no obvious ascites, no peritoneal signs, normal bowel sounds, no organomegaly Rectal: Mid Extremities: no lower extremity edema bilaterally Skin: no lesions on visible extremities Neuro: No focal deficits.   Cranial nerves intact  ASSESSMENT:  1.  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 diagnosed December 2016.  Recent flare.  Treated with topical mesalamine and prednisone in addition to his baseline oral mesalamine.  Improved   PLAN:  1.  Continue Lialda 4.8 g daily 2.  Continue to taper prednisone.  Start tomorrow 20 mg for 2 weeks then 10 mg for 2 weeks then stop 3.  Stop Rowasa enemas.  Refill Rowasa enemas to have on hand for on-demand use. 4.  Refill Lialda.  Medication risks reviewed 5.  Routine office follow-up 6 months.  Contact the office in the interim for any questions or problems 6.  Due for surveillance colonoscopy around December 2021.  He is aware A total time of 30 minutes was spent preparing to see the patient, reviewing relevant data, obtaining history, performing medically appropriate physical exam, counseling him regarding his ulcerative proctosigmoiditis, ordering and directing medical therapy, arranging follow-up, and documenting clinical information in the health record

## 2019-09-24 ENCOUNTER — Other Ambulatory Visit: Payer: Self-pay | Admitting: Internal Medicine

## 2019-09-24 DIAGNOSIS — K51311 Ulcerative (chronic) rectosigmoiditis with rectal bleeding: Secondary | ICD-10-CM

## 2019-09-25 DIAGNOSIS — E7800 Pure hypercholesterolemia, unspecified: Secondary | ICD-10-CM | POA: Diagnosis not present

## 2019-09-25 DIAGNOSIS — M2540 Effusion, unspecified joint: Secondary | ICD-10-CM | POA: Diagnosis not present

## 2019-09-25 DIAGNOSIS — R739 Hyperglycemia, unspecified: Secondary | ICD-10-CM | POA: Diagnosis not present

## 2019-09-27 DIAGNOSIS — M1A9XX0 Chronic gout, unspecified, without tophus (tophi): Secondary | ICD-10-CM | POA: Diagnosis not present

## 2019-09-27 DIAGNOSIS — I10 Essential (primary) hypertension: Secondary | ICD-10-CM | POA: Diagnosis not present

## 2019-09-27 DIAGNOSIS — E7800 Pure hypercholesterolemia, unspecified: Secondary | ICD-10-CM | POA: Diagnosis not present

## 2019-09-27 DIAGNOSIS — F5102 Adjustment insomnia: Secondary | ICD-10-CM | POA: Diagnosis not present

## 2019-09-28 ENCOUNTER — Other Ambulatory Visit (HOSPITAL_COMMUNITY): Payer: Self-pay | Admitting: Family Medicine

## 2019-10-16 ENCOUNTER — Encounter (INDEPENDENT_AMBULATORY_CARE_PROVIDER_SITE_OTHER): Payer: Self-pay | Admitting: Family Medicine

## 2019-10-16 ENCOUNTER — Other Ambulatory Visit: Payer: Self-pay

## 2019-10-16 ENCOUNTER — Ambulatory Visit (INDEPENDENT_AMBULATORY_CARE_PROVIDER_SITE_OTHER): Payer: 59 | Admitting: Family Medicine

## 2019-10-16 VITALS — BP 132/76 | HR 61 | Temp 98.2°F | Ht 71.0 in | Wt 249.0 lb

## 2019-10-16 DIAGNOSIS — K5180 Other ulcerative colitis without complications: Secondary | ICD-10-CM

## 2019-10-16 DIAGNOSIS — R0602 Shortness of breath: Secondary | ICD-10-CM | POA: Diagnosis not present

## 2019-10-16 DIAGNOSIS — R7303 Prediabetes: Secondary | ICD-10-CM

## 2019-10-16 DIAGNOSIS — E7849 Other hyperlipidemia: Secondary | ICD-10-CM | POA: Diagnosis not present

## 2019-10-16 DIAGNOSIS — Z6834 Body mass index (BMI) 34.0-34.9, adult: Secondary | ICD-10-CM

## 2019-10-16 DIAGNOSIS — R5383 Other fatigue: Secondary | ICD-10-CM

## 2019-10-16 DIAGNOSIS — E669 Obesity, unspecified: Secondary | ICD-10-CM

## 2019-10-16 DIAGNOSIS — Z0289 Encounter for other administrative examinations: Secondary | ICD-10-CM

## 2019-10-16 DIAGNOSIS — G4733 Obstructive sleep apnea (adult) (pediatric): Secondary | ICD-10-CM

## 2019-10-16 DIAGNOSIS — Z9189 Other specified personal risk factors, not elsewhere classified: Secondary | ICD-10-CM | POA: Diagnosis not present

## 2019-10-16 DIAGNOSIS — Z1331 Encounter for screening for depression: Secondary | ICD-10-CM | POA: Diagnosis not present

## 2019-10-16 DIAGNOSIS — M1A4720 Other secondary chronic gout, left ankle and foot, without tophus (tophi): Secondary | ICD-10-CM | POA: Diagnosis not present

## 2019-10-16 DIAGNOSIS — Z9989 Dependence on other enabling machines and devices: Secondary | ICD-10-CM

## 2019-10-16 NOTE — Progress Notes (Signed)
Chief Complaint:   OBESITY JUSTUN David Newman (MR# [id number]) is a 59 y.o. male who presents for evaluation and treatment of obesity and related comorbidities. Current BMI is Body mass index is 34.73 kg/m. David Newman has been struggling with his weight for many years and has been unsuccessful in either losing weight, maintaining weight loss, or reaching his healthy weight goal.  David Newman is currently in the action stage of change and ready to dedicate time achieving and maintaining a healthier weight. David Newman is interested in becoming our patient and working on intensive lifestyle modifications including (but not limited to) diet and exercise for weight loss.  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  He works 50 hours per week.  He is divorced and lives alone.  He has 2 adult children.  He drinks 2 beers/bourbon a day.  He has decreased his red meat intake.  David Newman provided the following food recall today:  Breakfast:  Toast x2 with peanut butter, banana, honey. Lunch:  Packed lunch of grilled chicken breast, salad mix, croutons, sunflower seeds, white balsalmic dressing. Dinner:  Protein, starch, vegetable. Drinks:  Yeti cup (85 ounces) water per day, 2 alcoholic beverages at night.    Ihan's habits were reviewed today and are as follows: His family eats meals together, he thinks his family will eat healthier with him, his desired weight loss is 56 pounds, he started gaining weight at age 36, his heaviest weight ever was 265 pounds, he craves carbs, he is frequently drinking liquids with calories, he frequently makes poor food choices and he frequently eats larger portions than normal.  Depression Screen Doniel's Food and Mood (modified PHQ-9) score was 3.  Depression screen PHQ 2/9 10/16/2019  Decreased Interest 1  Down, Depressed, Hopeless 0  PHQ - 2 Score 1  Altered sleeping 1  Tired, decreased energy 1  Change in appetite 0  Feeling bad or failure about yourself  0    Trouble concentrating 0  Moving slowly or fidgety/restless 0  Suicidal thoughts 0  PHQ-9 Score 3  Difficult doing work/chores Not difficult at all   Subjective:   1. Other fatigue David Newman denies daytime somnolence and admits to waking up still tired. Patent has a history of symptoms of morning fatigue and snoring. David Newman generally gets 5 or 6 hours of sleep per night, and states that he has poor quality sleep. Snoring is present. Apneic episodes are not present. Epworth Sleepiness Score is 3.    2. SOB (shortness of breath) on exertion David Newman notes increasing shortness of breath with exercising and seems to be worsening over time with weight gain. He notes getting out of breath sooner with activity than he used to. This has gotten worse recently. David Newman denies shortness of breath at rest or orthopnea.  3. Prediabetes David Newman has a diagnosis of prediabetes based on his elevated HgA1c and was informed this puts him at greater risk of developing diabetes. He continues to work on diet and exercise to decrease his risk of diabetes. He denies nausea or hypoglycemia.  4. Other hyperlipidemia David Newman has hyperlipidemia and has been trying to improve his cholesterol levels with intensive lifestyle modification including a low saturated fat diet, exercise and weight loss. He denies any chest pain, claudication or myalgias.  Lab Results  Component Value Date   ALT 24 06/23/2017   AST 22 06/23/2017   ALKPHOS 70 06/23/2017   BILITOT 0.7 06/23/2017   5. OSA on CPAP David Newman has a diagnosis of  sleep apnea. He reports that he is using a CPAP regularly.   6. 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 Henrene Pastor.  He takes Lialda and mesalamine.   7. 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 Theda Sers and [facility].  8. Depression screening David Newman was screened for depression as part of his new patient workup.  PHQ-9  is 3.  Assessment/Plan:   1. Other fatigue David Newman does feel that his weight is causing his energy to be lower than it should be. Fatigue may be related to obesity, depression or many other causes. Labs will be ordered, and in the meanwhile, David Newman will focus on self care including making healthy food choices, increasing physical activity and focusing on stress reduction.  Orders - EKG 12-Lead  2. SOB (shortness of breath) on exertion David Newman does feel that he gets out of breath more easily that he used to when he exercises. Arhum's shortness of breath appears to be obesity related and exercise induced. He has agreed to work on weight loss and gradually increase exercise to treat his exercise induced shortness of breath. Will continue to monitor closely.  3. Prediabetes David Newman will continue to work on weight loss, exercise, and decreasing simple carbohydrates to help decrease the risk of diabetes.   4. Other hyperlipidemia Cardiovascular risk and specific lipid/LDL goals reviewed.  We discussed several lifestyle modifications today and David Newman will continue to work on diet, exercise and weight loss efforts. Orders and follow up as documented in patient record.   Counseling Intensive lifestyle modifications are the first line treatment for this issue. . Dietary changes: Increase soluble fiber. Decrease simple carbohydrates. . Exercise changes: Moderate to vigorous-intensity aerobic activity 150 minutes per week if tolerated. . Lipid-lowering medications: see documented in medical record.  5. OSA on CPAP Intensive lifestyle modifications are the first line treatment for this issue. We discussed several lifestyle modifications today and he will continue to work on diet, exercise and weight loss efforts. We will continue to monitor. Orders and follow up as documented in patient record.   Counseling  Sleep apnea is a condition in which breathing pauses or becomes shallow during  sleep. This happens over and over during the night. This disrupts your sleep and keeps your body from getting the rest that it needs, which can cause tiredness and lack of energy (fatigue) during the day.  Sleep apnea treatment: If you were given a device to open your airway while you sleep, USE IT!  Sleep hygiene:   Limit or avoid alcohol, caffeinated beverages, and cigarettes, especially close to bedtime.   Do not eat a large meal or eat spicy foods right before bedtime. This can lead to digestive discomfort that can make it hard for you to sleep.  Keep a sleep diary to help you and your health care provider figure out what could be causing your insomnia.  . Make your bedroom a dark, comfortable place where it is easy to fall asleep. ? Put up shades or blackout curtains to block light from outside. ? Use a white noise machine to block noise. ? Keep the temperature cool. . Limit screen use before bedtime. This includes: ? Watching TV. ? Using your smartphone, tablet, or computer. . Stick to a routine that includes going to bed and waking up at the same times every day and night. This can help you fall asleep faster. Consider making a quiet activity, such as reading, part  of your nighttime routine. . Try to avoid taking naps during the day so that you sleep better at night. . Get out of bed if you are still awake after 15 minutes of trying to sleep. Keep the lights down, but try reading or doing a quiet activity. When you feel sleepy, go back to bed.  6. Other ulcerative colitis without complication ([place]) Followed by Dr. Henrene Pastor for this problem. Those encounter notes were reviewed.  7. 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  8. Depression screening Depression screen was negative.  9. At risk for constipation David Newman was given approximately 15 minutes of counseling today regarding prevention of constipation.  He was encouraged to increase water and fiber intake.   10. Class 1 obesity with serious comorbidity and body mass index (BMI) of 34.0 to 34.9 in adult, unspecified obesity type David Newman is currently in the action stage of change and his goal is to continue with weight loss efforts. I recommend David Newman begin the structured treatment plan as follows:  He has agreed to the Category 4 Plan.  Ok to keep a food journal and adhere to recommended goals of 1800-2200 calories and 135+ grams of protein.  Exercise goals: No exercise has been prescribed at this time.   Behavioral modification strategies: increasing lean protein intake, decreasing simple carbohydrates, increasing vegetables, increasing water intake, decreasing liquid calories, decreasing alcohol intake, decreasing sodium intake and increasing high fiber foods.  He was informed of the importance of frequent follow-up visits to maximize his success with intensive lifestyle modifications for his multiple health conditions. He was informed we would discuss his lab results at his next visit unless there is a critical issue that needs to be addressed sooner. David Newman agreed to keep his next visit at the agreed upon time to discuss these results.  Orders Placed This Encounter  Procedures  . Comprehensive metabolic panel  . CBC with Differential/Platelet  . VITAMIN D 25 Hydroxy (Vit-D Deficiency, Fractures)  . Vitamin B12  . T3  . T4, free  . TSH  . EKG 12-Lead   Objective:   Blood pressure 132/76, pulse 61, temperature 98.2 F (36.8 C), temperature source Oral, height 5' 11"  (1.803 m), weight 249 lb (112.9 kg), SpO2 96 %. Body mass index is 34.73 kg/m.  EKG: Normal sinus rhythm, rate 71 bpm.  Indirect Calorimeter completed today shows a VO2 of 391 and a REE of 2724.  His calculated basal metabolic rate is 5993 thus his basal metabolic rate is better than expected.  General: Cooperative, alert, well developed, in no acute  distress. HEENT: Conjunctivae and lids unremarkable. Cardiovascular: Regular rhythm.  Lungs: Normal work of breathing. Neurologic: No focal deficits.   Lab Results  Component Value Date   CREATININE 1.24 06/23/2017   BUN 17 06/23/2017   NA 139 06/23/2017   K 3.9 06/23/2017   CL 103 06/23/2017   CO2 28 06/23/2017   Lab Results  Component Value Date   ALT 24 06/23/2017   AST 22 06/23/2017   ALKPHOS 70 06/23/2017   BILITOT 0.7 06/23/2017   Lab Results  Component Value Date   WBC 9.2 06/23/2017   HGB 14.7 06/23/2017   HCT 42.7 06/23/2017   MCV 88.3 06/23/2017   PLT 270.0 06/23/2017   Attestation Statements:   This is the patient's first visit at [facility]. The patient's NEW PATIENT PACKET was reviewed at length. Included in the  packet: current and past health history, medications, allergies, ROS, gynecologic history (women only), surgical history, family history, social history, weight history, weight loss surgery history (for those that have had weight loss surgery), nutritional evaluation, mood and food questionnaire, PHQ9, Epworth questionnaire, sleep habits questionnaire, patient life and health improvement goals questionnaire. These will all be scanned into the patient's chart under media.   During the visit, I independently reviewed the patient's EKG, bioimpedance scale results, and indirect calorimeter results. I used this information to tailor a meal plan for the patient that will help him to lose weight and will improve his obesity-related conditions going forward. I performed a medically necessary appropriate examination and/or evaluation. I discussed the assessment and treatment plan with the patient. The patient was provided an opportunity to ask questions and all were answered. The patient agreed with the plan and demonstrated an understanding of the instructions. Labs were ordered at this visit and will be reviewed at the next visit unless more critical  results need to be addressed immediately. Clinical information was updated and documented in the EMR.   I, Water quality scientist, CMA, am acting as Location manager for [employer], DO.  I have reviewed the above documentation for accuracy and completeness, and I agree with the above. Briscoe Deutscher, DO

## 2019-10-17 ENCOUNTER — Encounter (INDEPENDENT_AMBULATORY_CARE_PROVIDER_SITE_OTHER): Payer: Self-pay | Admitting: Family Medicine

## 2019-10-17 LAB — CBC WITH DIFFERENTIAL/PLATELET
Basophils Absolute: 0.1 10*3/uL (ref 0.0–0.2)
Basos: 1 %
EOS (ABSOLUTE): 0.2 10*3/uL (ref 0.0–0.4)
Eos: 3 %
Hematocrit: 43.6 % (ref 37.5–51.0)
Hemoglobin: 14.5 g/dL (ref 13.0–17.7)
Immature Grans (Abs): 0 10*3/uL (ref 0.0–0.1)
Immature Granulocytes: 0 %
Lymphocytes Absolute: 1.1 10*3/uL (ref 0.7–3.1)
Lymphs: 23 %
MCH: 30.1 pg (ref 26.6–33.0)
MCHC: 33.3 g/dL (ref 31.5–35.7)
MCV: 91 fL (ref 79–97)
Monocytes Absolute: 0.6 10*3/uL (ref 0.1–0.9)
Monocytes: 13 %
Neutrophils Absolute: 3 10*3/uL (ref 1.4–7.0)
Neutrophils: 60 %
Platelets: 278 10*3/uL (ref 150–450)
RBC: 4.82 x10E6/uL (ref 4.14–5.80)
RDW: 13.7 % (ref 11.6–15.4)
WBC: 5 10*3/uL (ref 3.4–10.8)

## 2019-10-17 LAB — COMPREHENSIVE METABOLIC PANEL
ALT: 36 IU/L (ref 0–44)
AST: 31 IU/L (ref 0–40)
Albumin/Globulin Ratio: 2.1 (ref 1.2–2.2)
Albumin: 4.9 g/dL (ref 3.8–4.9)
Alkaline Phosphatase: 96 IU/L (ref 48–121)
BUN/Creatinine Ratio: 18 (ref 9–20)
BUN: 21 mg/dL (ref 6–24)
Bilirubin Total: 0.5 mg/dL (ref 0.0–1.2)
CO2: 23 mmol/L (ref 20–29)
Calcium: 9.9 mg/dL (ref 8.7–10.2)
Chloride: 101 mmol/L (ref 96–106)
Creatinine, Ser: 1.2 mg/dL (ref 0.76–1.27)
GFR calc Af Amer: 77 mL/min/{1.73_m2} (ref >59)
GFR calc non Af Amer: 67 mL/min/{1.73_m2} (ref >59)
Globulin, Total: 2.3 g/dL (ref 1.5–4.5)
Glucose: 110 mg/dL — ABNORMAL HIGH (ref 65–99)
Potassium: 4.9 mmol/L (ref 3.5–5.2)
Sodium: 140 mmol/L (ref 134–144)
Total Protein: 7.2 g/dL (ref 6.0–8.5)

## 2019-10-17 LAB — T3: T3, Total: 72 ng/dL (ref 71–180)

## 2019-10-17 LAB — VITAMIN D 25 HYDROXY (VIT D DEFICIENCY, FRACTURES): Vit D, 25-Hydroxy: 16.4 ng/mL — ABNORMAL LOW (ref 30.0–100.0)

## 2019-10-17 LAB — VITAMIN B12: Vitamin B-12: 202 pg/mL — ABNORMAL LOW (ref 232–1245)

## 2019-10-17 LAB — TSH: TSH: 1.74 u[IU]/mL (ref 0.450–4.500)

## 2019-10-17 LAB — T4, FREE: Free T4: 0.87 ng/dL (ref 0.82–1.77)

## 2019-10-17 NOTE — Telephone Encounter (Signed)
Please advise. Thanks.  

## 2019-10-19 ENCOUNTER — Other Ambulatory Visit: Payer: Self-pay | Admitting: Family Medicine

## 2019-10-19 MED ORDER — VITAMIN D (ERGOCALCIFEROL) 1.25 MG (50000 UNIT) PO CAPS: 50000.0000 [IU] | ORAL_CAPSULE | ORAL | 0 refills | Status: DC

## 2019-10-22 NOTE — Telephone Encounter (Signed)
Please advise. Thanks.  

## 2019-10-22 NOTE — Telephone Encounter (Signed)
Please advise thanks.

## 2019-10-30 ENCOUNTER — Ambulatory Visit (INDEPENDENT_AMBULATORY_CARE_PROVIDER_SITE_OTHER): Payer: 59 | Admitting: Family Medicine

## 2019-10-30 ENCOUNTER — Encounter (INDEPENDENT_AMBULATORY_CARE_PROVIDER_SITE_OTHER): Payer: Self-pay | Admitting: Family Medicine

## 2019-10-30 ENCOUNTER — Other Ambulatory Visit: Payer: Self-pay

## 2019-10-30 VITALS — BP 122/80 | HR 67 | Temp 98.4°F | Ht 71.0 in | Wt 245.0 lb

## 2019-10-30 DIAGNOSIS — R7303 Prediabetes: Secondary | ICD-10-CM | POA: Diagnosis not present

## 2019-10-30 DIAGNOSIS — E538 Deficiency of other specified B group vitamins: Secondary | ICD-10-CM | POA: Diagnosis not present

## 2019-10-30 DIAGNOSIS — Z6834 Body mass index (BMI) 34.0-34.9, adult: Secondary | ICD-10-CM | POA: Diagnosis not present

## 2019-10-30 DIAGNOSIS — E669 Obesity, unspecified: Secondary | ICD-10-CM

## 2019-10-30 DIAGNOSIS — Z9189 Other specified personal risk factors, not elsewhere classified: Secondary | ICD-10-CM | POA: Diagnosis not present

## 2019-10-30 DIAGNOSIS — E559 Vitamin D deficiency, unspecified: Secondary | ICD-10-CM | POA: Diagnosis not present

## 2019-10-30 NOTE — Progress Notes (Signed)
Chief Complaint:   OBESITY David Newman is here to discuss his progress with his obesity treatment plan along with follow-up of his obesity related diagnoses. David Newman is on the Category 4 Plan and states he is following his eating plan approximately 95% of the time. David Newman states he is walking for 1.5 miles and doing regab stretches 4-5 times per week.  Today's visit was #: 2 Starting weight: 249 lbs Starting date: 10/16/2019 Today's weight: 245 lbs Today's date: 10/30/2019 Total lbs lost to date: 4 lbs Total lbs lost since last in-office visit: 4 lbs  Interim History: David Newman has been logging 1550-1600 calories daily on to his MyFitness Pal.  He notes his carbs are down (bread and potatoes) and he has had less nuts.  He reports having appropriate hunger cues.  He has stopped drinking alcohol.  Subjective:   1. Vitamin D deficiency Chastin's Vitamin D level was 16.4 on 10/16/2019. He is currently taking prescription vitamin D 50,000 IU each week. He denies nausea, vomiting or muscle weakness.  2. B12 deficiency He is not a vegetarian.  He does not have a previous diagnosis of pernicious anemia.  He does not have a history of weight loss surgery.  David Newman has started taking a B complex vitamin.  Lab Results  Component Value Date   VITAMINB12 202 (L) 10/16/2019   3. Prediabetes David Newman has a diagnosis of prediabetes based on his elevated HgA1c and was informed this puts him at greater risk of developing diabetes. He continues to work on diet and exercise to decrease his risk of diabetes. He denies nausea or hypoglycemia.  4. At risk for heart disease David Newman is at a higher than average risk for cardiovascular disease due to obesity.   Assessment/Plan:   1. Vitamin D deficiency Low Vitamin D level contributes to fatigue and are associated with obesity, breast, and colon cancer. He agrees to continue to take prescription Vitamin D @50 ,000 IU every week and will follow-up for  routine testing of Vitamin D, at least 2-3 times per year to avoid over-replacement.  2. B12 deficiency The diagnosis was reviewed with the patient. Counseling provided today, see below. We will continue to monitor. Orders and follow up as documented in patient record.  Counseling . The body needs vitamin B12: to make red blood cells; to make DNA; and to help the nerves work properly so they can carry messages from the brain to the body.  . The main causes of vitamin B12 deficiency include dietary deficiency, digestive diseases, pernicious anemia, and having a surgery in which part of the stomach or small intestine is removed.  . Certain medicines can make it harder for the body to absorb vitamin B12. These medicines include: heartburn medications; some antibiotics; some medications used to treat diabetes, gout, and high cholesterol.  . In some cases, there are no symptoms of this condition. If the condition leads to anemia or nerve damage, various symptoms can occur, such as weakness or fatigue, shortness of breath, and numbness or tingling in your hands and feet.   . Treatment:  o May include taking vitamin B12 supplements.  o Avoid alcohol.  o Eat lots of healthy foods that contain vitamin B12: - Beef, pork, chicken, [place], and organ meats, such as liver.  - Seafood: This includes clams, rainbow trout, salmon, tuna, and haddock. Eggs.  - Cereal and dairy products that are fortified: This means that vitamin B12 has been added to the food.   3. Prediabetes David Newman  will continue to work on weight loss, exercise, and decreasing simple carbohydrates to help decrease the risk of diabetes.   4. At risk for heart disease David Newman was given approximately 15 minutes of coronary artery disease prevention counseling today. He is 59 y.o. male and has risk factors for heart disease including obesity. We discussed intensive lifestyle modifications today with an emphasis on specific weight loss  instructions and strategies.   Repetitive spaced learning was employed today to elicit superior memory formation and behavioral change.  5. Class 1 obesity with serious comorbidity and body mass index (BMI) of 34.0 to 34.9 in adult, unspecified obesity type David Newman is currently in the action stage of change. As such, his goal is to continue with weight loss efforts. He has agreed to keeping a food journal and adhering to recommended goals of 1800-2200 calories and 135+ grams of protein.   Exercise goals: For substantial health benefits, adults should do at least 150 minutes (2 hours and 30 minutes) a week of moderate-intensity, or 75 minutes (1 hour and 15 minutes) a week of vigorous-intensity aerobic physical activity, or an equivalent combination of moderate- and vigorous-intensity aerobic activity. Aerobic activity should be performed in episodes of at least 10 minutes, and preferably, it should be spread throughout the week.  Behavioral modification strategies: increasing lean protein intake and increasing water intake.  David Newman has agreed to follow-up with our clinic in 2-3 weeks. He was informed of the importance of frequent follow-up visits to maximize his success with intensive lifestyle modifications for his multiple health conditions.   Objective:   Blood pressure 122/80, pulse 67, temperature 98.4 F (36.9 C), temperature source Oral, height 5' 11"  (1.803 m), weight 245 lb (111.1 kg), SpO2 97 %. Body mass index is 34.17 kg/m.  General: Cooperative, alert, well developed, in no acute distress. HEENT: Conjunctivae and lids unremarkable. Cardiovascular: Regular rhythm.  Lungs: Normal work of breathing. Neurologic: No focal deficits.   Lab Results  Component Value Date   CREATININE 1.20 10/16/2019   BUN 21 10/16/2019   NA 140 10/16/2019   K 4.9 10/16/2019   CL 101 10/16/2019   CO2 23 10/16/2019   Lab Results  Component Value Date   ALT 36 10/16/2019   AST 31 10/16/2019     ALKPHOS 96 10/16/2019   BILITOT 0.5 10/16/2019   Lab Results  Component Value Date   TSH 1.740 10/16/2019   Lab Results  Component Value Date   WBC 5.0 10/16/2019   HGB 14.5 10/16/2019   HCT 43.6 10/16/2019   MCV 91 10/16/2019   PLT 278 10/16/2019   Attestation Statements:   Reviewed by clinician on day of visit: allergies, medications, problem list, medical history, surgical history, family history, social history, and previous encounter notes.  I, Water quality scientist, CMA, am acting as transcriptionist for Briscoe Deutscher, DO  I have reviewed the above documentation for accuracy and completeness, and I agree with the above. Briscoe Deutscher, DO

## 2019-11-11 IMAGING — MR MR KNEE*L* W/O CM
4 of 7 series · 19 of 40 positions shown · non-contrast
Comparison: None.

CLINICAL DATA: Patient was climbing stairs while carrying a heavy
cooler and hurt his left knee on 06/29/2018.

EXAM:
MRI OF THE LEFT KNEE WITHOUT CONTRAST
TECHNIQUE: Multiplanar, multisequence MR imaging of the knee was performed. No
intravenous contrast was administered.

[Series 4: T2 fat-sat · axial · 4.0mm · 0.31mm/px · z∈[-128,+26]mm · 4 of 38 slices shown]
[im 1/38]
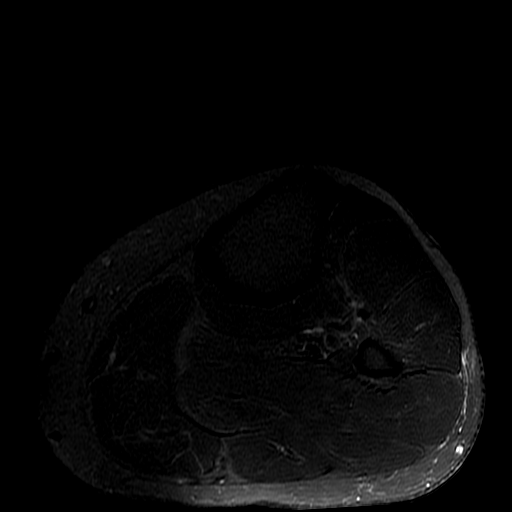
[im 6/38]
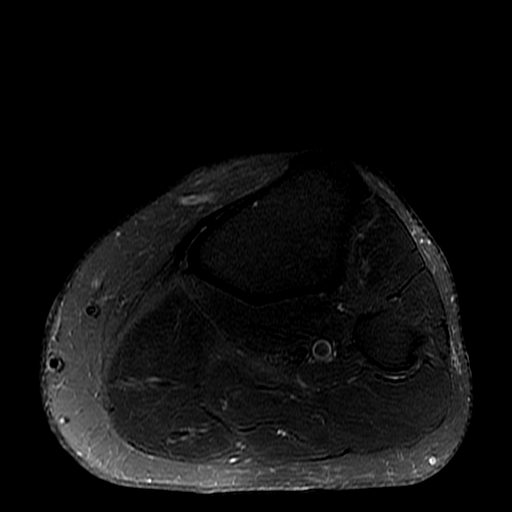
[im 22/38]
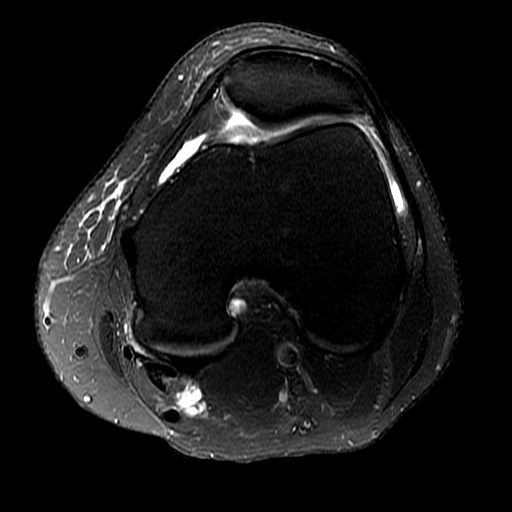
[im 32/38]
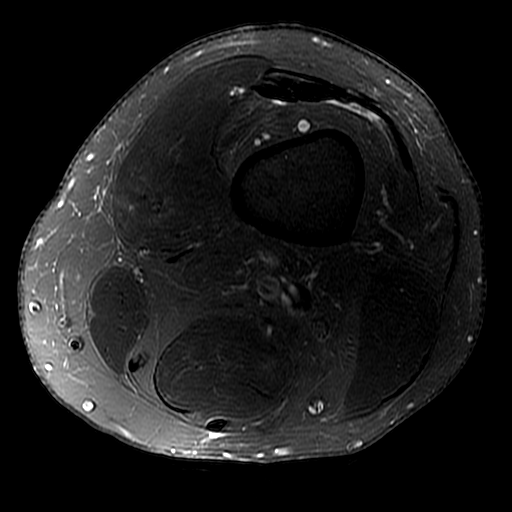

[Series 6: PD fat-sat · sagittal · 3.0mm · 0.29mm/px · 6 of 34 slices shown (1 of 3)]
[im 1/34]
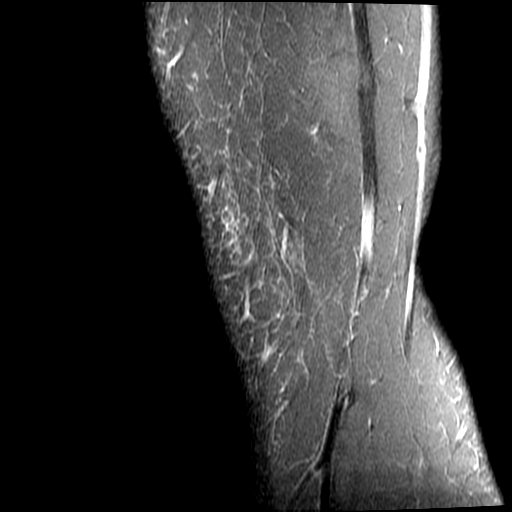
[im 7/34]
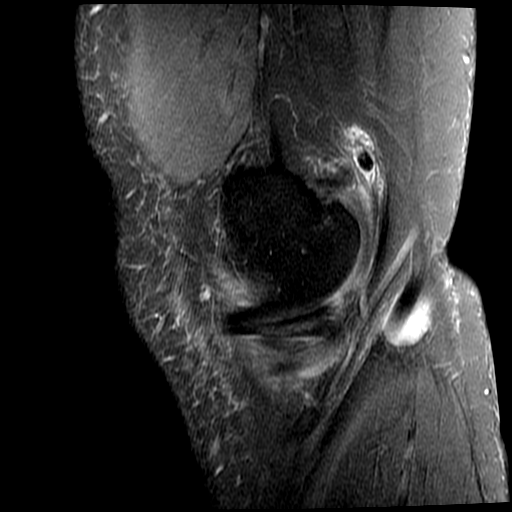
[im 14/34]
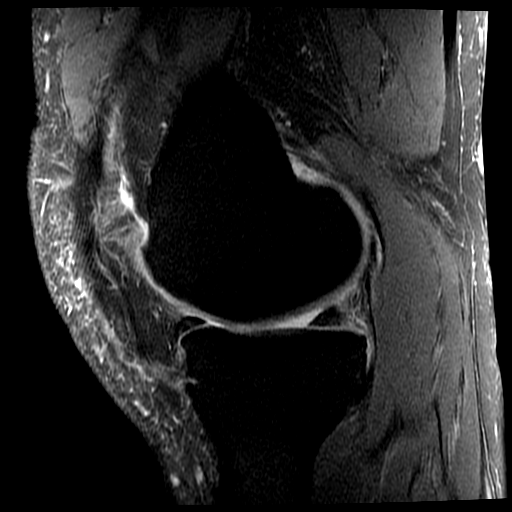
[im 20/34]
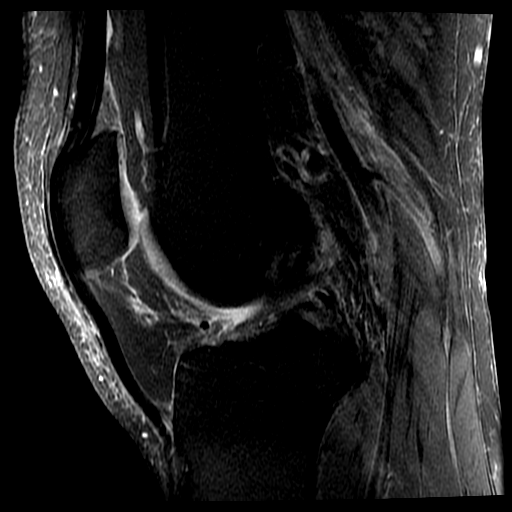
[im 27/34]
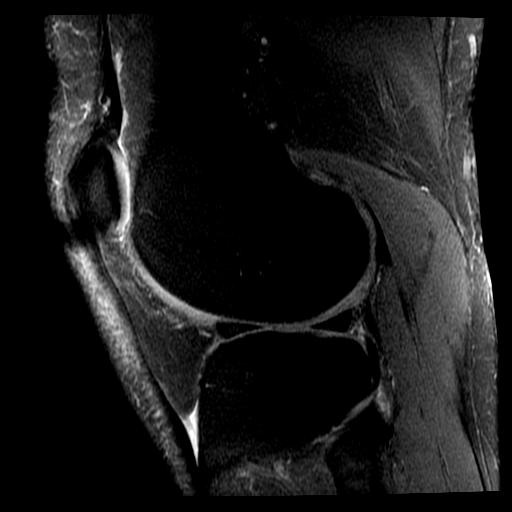
[im 34/34]
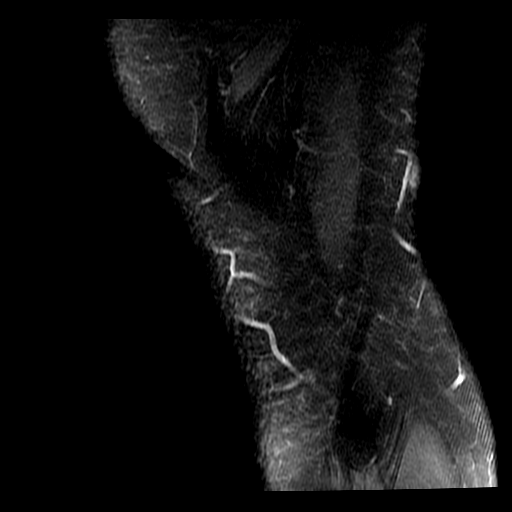

[Series 7: PD fat-sat · coronal · 4.0mm · 0.31mm/px · 5 of 29 slices shown (2 of 3)]
[im 1/29]
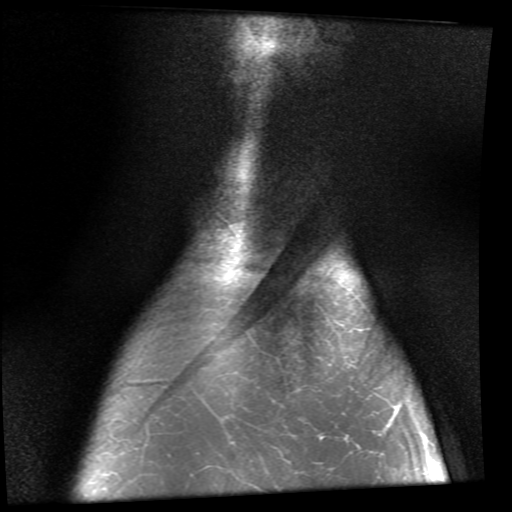
[im 8/29]
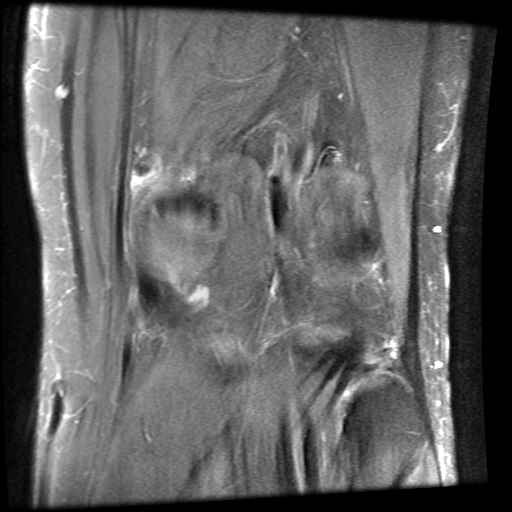
[im 15/29]
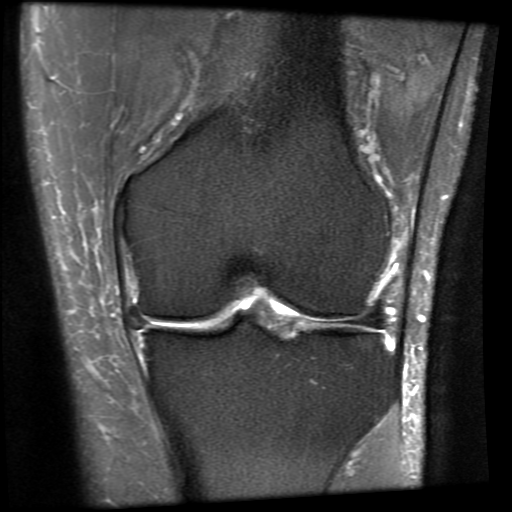
[im 22/29]
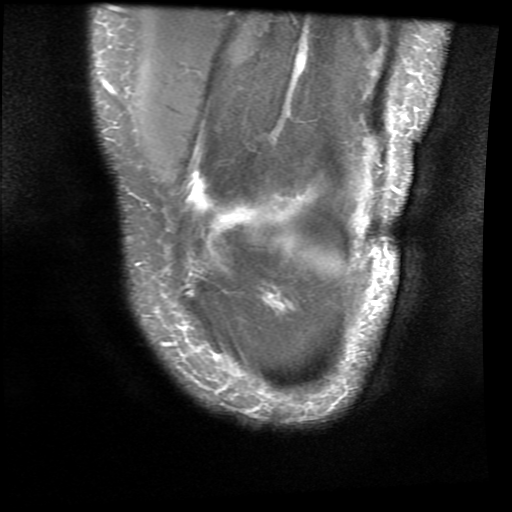
[im 29/29]
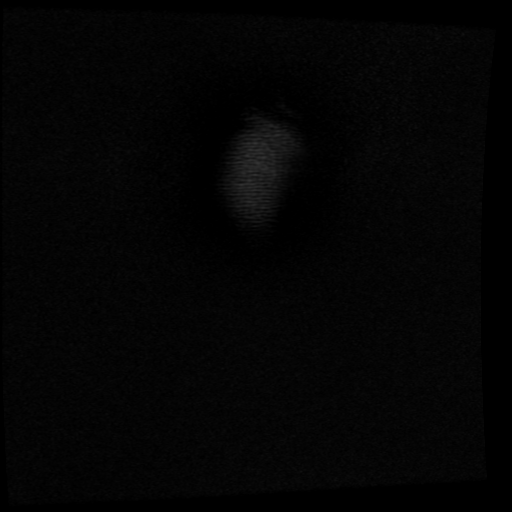

[Series 9: PD fat-sat · coronal · 2.0mm · 0.35mm/px · 4 of 19 slices shown (3 of 3)]
[im 1/19]
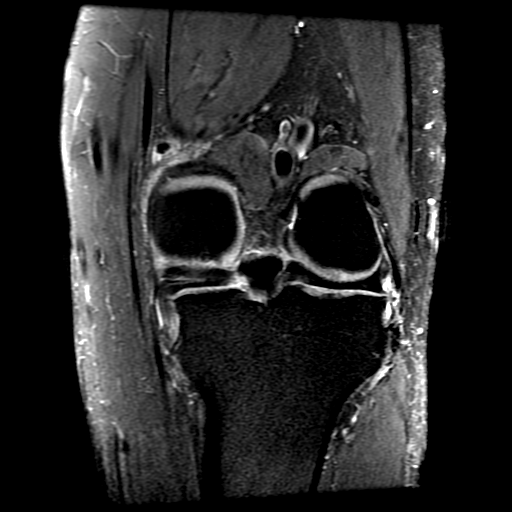
[im 7/19]
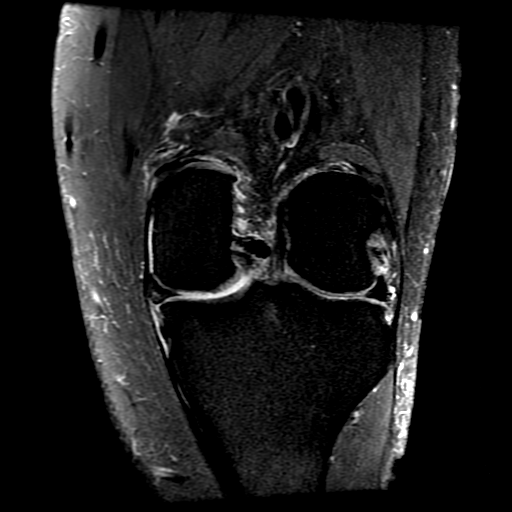
[im 13/19]
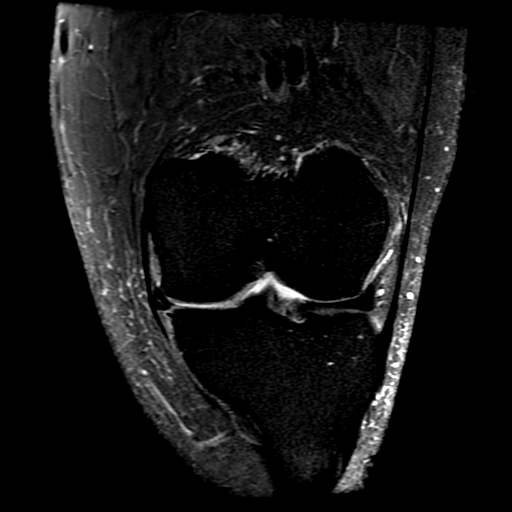
[im 19/19]
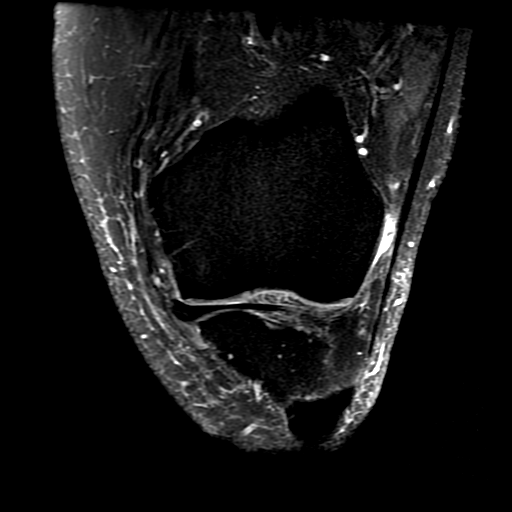

[19 of 40 positions shown; findings below may reference images not displayed]

FINDINGS: MENISCI

Medial meniscus: Full-thickness radial tear of the posteromesial
corner of the medial meniscus near the meniscal root, series [DATE]
and series [DATE]. Mucoid degeneration is otherwise noted of the
posterior horn and body of the medial meniscus with slight outward
subluxation of the body of the meniscus up to 4 mm.

Lateral meniscus:  Intact

LIGAMENTS

Cruciates: Intact ACL. Split tear along the horizontal portion of
the PCL, series [DATE].

Collaterals: Intact

CARTILAGE

Patellofemoral:  Intact

Medial: Mild partial-thickness cartilage loss of the medial
femorotibial compartment.

Lateral:  Intact

Joint:  No joint effusion. Normal Hoffa's fat. No plical thickening.

Popliteal Fossa: Thinly septated complex popliteal cyst measuring
3.2 x 0.9 x 1.3 cm. Intact popliteus.

Extensor Mechanism:  Intact extensor mechanism and MPFL.

Bones:  No marrow edema, fracture nor joint dislocation.

Other: Fluid in the deep infrapatellar bursa consistent with
bursitis. Anterior subcutaneous soft tissue edema of the knee.
IMPRESSION: 1. Full-thickness radial tear of the posteromesial corner of the
medial meniscus with outward subluxation of the body by 4 mm. Intact
lateral meniscus.
2. Thin split tear of the horizontal portion of the PCL. Intact ACL.
Intact collateral ligaments.
3. Complex popliteal cyst measuring 3.2 x 0.9 x 1.3 cm with thin
internal septations.
4. Deep infrapatellar bursitis.
5. No acute osseous abnormality.

## 2019-11-13 ENCOUNTER — Other Ambulatory Visit: Payer: Self-pay

## 2019-11-13 ENCOUNTER — Encounter (INDEPENDENT_AMBULATORY_CARE_PROVIDER_SITE_OTHER): Payer: Self-pay | Admitting: Adult Health

## 2019-11-13 ENCOUNTER — Ambulatory Visit (INDEPENDENT_AMBULATORY_CARE_PROVIDER_SITE_OTHER): Payer: 59 | Admitting: Adult Health

## 2019-11-13 VITALS — BP 115/78 | HR 64 | Temp 98.3°F | Ht 71.0 in | Wt 240.0 lb

## 2019-11-13 DIAGNOSIS — Z9189 Other specified personal risk factors, not elsewhere classified: Secondary | ICD-10-CM

## 2019-11-13 DIAGNOSIS — R7303 Prediabetes: Secondary | ICD-10-CM | POA: Diagnosis not present

## 2019-11-13 DIAGNOSIS — Z6833 Body mass index (BMI) 33.0-33.9, adult: Secondary | ICD-10-CM | POA: Diagnosis not present

## 2019-11-13 DIAGNOSIS — E559 Vitamin D deficiency, unspecified: Secondary | ICD-10-CM | POA: Insufficient documentation

## 2019-11-13 DIAGNOSIS — E669 Obesity, unspecified: Secondary | ICD-10-CM

## 2019-11-13 DIAGNOSIS — E538 Deficiency of other specified B group vitamins: Secondary | ICD-10-CM | POA: Diagnosis not present

## 2019-11-13 MED ORDER — VITAMIN D (ERGOCALCIFEROL) 1.25 MG (50000 UNIT) PO CAPS: 50000.0000 [IU] | ORAL_CAPSULE | ORAL | 0 refills | Status: DC

## 2019-11-13 NOTE — Progress Notes (Signed)
Chief Complaint:   OBESITY David Newman is here to discuss his progress with his obesity treatment plan along with follow-up of his obesity related diagnoses. David Newman is keeping a food journal and adhering to recommended goals of 1800-2200 calories and 135 grams of protein and states he is following his eating plan approximately 95% of the time. David Newman states he is doing cardio/weightlifting 20-30 minutes 5 times per week.  Today's visit was #: 3 Starting weight: 249 lbs Starting date: 10/16/2019 Today's weight: 240 lbs Today's date: 11/13/2019 Total lbs lost to date: 9 Total lbs lost since last in-office visit: 5  Interim History: David Newman estimates to average 1600 calories and 90-100 grams of protein per day. He has been diligently using [employer] to track his progress. He denies polyphagia or excessive fatigue. He continues to abstain from alcohol use.  Subjective:   Vitamin D deficiency. David Newman is on prescription strength Vitamin D supplementation and tolerating it well. He denies medication SE.   Ref. Range 10/16/2019 15:13  Vitamin D, 25-Hydroxy Latest Ref Range: 30.0 - 100.0 ng/mL 16.4 (L)   B12 deficiency. David Newman has continued with OTC B12 supplementation. He denies excessive fatigue.   Lab Results  Component Value Date   VITAMINB12 202 (L) 10/16/2019   Prediabetes. David Newman has a diagnosis of prediabetes based on his elevated HgA1c and was informed this puts him at greater risk of developing diabetes. He continues to work on diet and exercise to decrease his risk of diabetes. He denies nausea or hypoglycemia. No polyphagia. David Newman is not on metformin.  No results found for: HGBA1C No results found for: INSULIN  At risk for osteoporosis. David Newman is at higher risk of osteopenia and osteoporosis due to Vitamin D deficiency and obesity.   Assessment/Plan:   Vitamin D deficiency. Low Vitamin D level contributes to fatigue and are associated with  obesity, breast, and colon cancer. He was given a refill on his Vitamin D, Ergocalciferol, (DRISDOL) 1.25 MG (50000 UNIT) CAPS capsule every week #4 with 0 refills and will follow-up for routine testing of Vitamin D, at least 2-3 times per year to avoid over-replacement.   B12 deficiency. The diagnosis was reviewed with the patient. Counseling provided today, see below. We will continue to monitor. Orders and follow up as documented in patient record. David Newman will continue the journaling plan.  Counseling . The body needs vitamin B12: to make red blood cells; to make DNA; and to help the nerves work properly so they can carry messages from the brain to the body.  . The main causes of vitamin B12 deficiency include dietary deficiency, digestive diseases, pernicious anemia, and having a surgery in which part of the stomach or small intestine is removed.  . Certain medicines can make it harder for the body to absorb vitamin B12. These medicines include: heartburn medications; some antibiotics; some medications used to treat diabetes, gout, and high cholesterol.  . In some cases, there are no symptoms of this condition. If the condition leads to anemia or nerve damage, various symptoms can occur, such as weakness or fatigue, shortness of breath, and numbness or tingling in your hands and feet.   . Treatment:  o May include taking vitamin B12 supplements.  o Avoid alcohol.  o Eat lots of healthy foods that contain vitamin B12: - Beef, pork, chicken, [place], and organ meats, such as liver.  - Seafood: This includes clams, rainbow trout, salmon, tuna, and haddock.  - Eggs.  -  Cereal and dairy products that are fortified: This means that vitamin B12 has been added to the food.   Prediabetes. David Newman will continue to work on weight loss, exercise, and decreasing simple carbohydrates to help decrease the risk of diabetes. He will continue the journaling plan.  At risk for osteoporosis. David Newman was given  approximately 15 minutes of osteoporosis prevention counseling today. David Newman is at risk for osteopenia and osteoporosis due to his Vitamin D deficiency. He was encouraged to take his Vitamin D and follow his higher calcium diet and increase strengthening exercise to help strengthen his bones and decrease his risk of osteopenia and osteoporosis.  Repetitive spaced learning was employed today to elicit superior memory formation and behavioral change.  Class 1 obesity with serious comorbidity and body mass index (BMI) of 33.0 to 33.9 in adult, unspecified obesity type.  David Newman is currently in the action stage of change. As such, his goal is to continue with weight loss efforts. He has agreed to keeping a food journal and adhering to recommended goals of 1800-2200 calories and 135+ grams of protein daily. We recommend using a food scale to measure/increase protein at meals.  Exercise goals: David Newman will continue his current exercise regimen.   Behavioral modification strategies: increasing lean protein intake, decreasing simple carbohydrates, meal planning and cooking strategies and planning for success.  David Newman has agreed to follow-up with our clinic in 2 weeks. He was informed of the importance of frequent follow-up visits to maximize his success with intensive lifestyle modifications for his multiple health conditions.   Objective:   Blood pressure 115/78, pulse 64, temperature 98.3 F (36.8 C), temperature source Oral, height 5' 11"  (1.803 m), weight 240 lb (108.9 kg), SpO2 97 %. Body mass index is 33.47 kg/m.  General: Cooperative, alert, well developed, in no acute distress. HEENT: Conjunctivae and lids unremarkable. Cardiovascular: Regular rhythm.  Lungs: Normal work of breathing. Neurologic: No focal deficits.   Lab Results  Component Value Date   CREATININE 1.20 10/16/2019   BUN 21 10/16/2019   NA 140 10/16/2019   K 4.9 10/16/2019   CL 101 10/16/2019   CO2 23 10/16/2019     Lab Results  Component Value Date   ALT 36 10/16/2019   AST 31 10/16/2019   ALKPHOS 96 10/16/2019   BILITOT 0.5 10/16/2019   No results found for: HGBA1C No results found for: INSULIN Lab Results  Component Value Date   TSH 1.740 10/16/2019   No results found for: CHOL, HDL, LDLCALC, LDLDIRECT, TRIG, CHOLHDL Lab Results  Component Value Date   WBC 5.0 10/16/2019   HGB 14.5 10/16/2019   HCT 43.6 10/16/2019   MCV 91 10/16/2019   PLT 278 10/16/2019   No results found for: IRON, TIBC, FERRITIN  Attestation Statements:   Reviewed by clinician on day of visit: allergies, medications, problem list, medical history, surgical history, family history, social history, and previous encounter notes.  I, Michaelene Song, am acting as Location manager for [employer], NP-C   I have reviewed the above documentation for accuracy and completeness, and I agree with the above. -  Esaw Grandchild, NP

## 2019-11-27 ENCOUNTER — Other Ambulatory Visit: Payer: Self-pay

## 2019-11-27 ENCOUNTER — Encounter (INDEPENDENT_AMBULATORY_CARE_PROVIDER_SITE_OTHER): Payer: Self-pay | Admitting: Adult Health

## 2019-11-27 ENCOUNTER — Ambulatory Visit (INDEPENDENT_AMBULATORY_CARE_PROVIDER_SITE_OTHER): Payer: 59 | Admitting: Adult Health

## 2019-11-27 VITALS — BP 109/74 | HR 76 | Temp 98.0°F | Ht 71.0 in | Wt 237.0 lb

## 2019-11-27 DIAGNOSIS — E669 Obesity, unspecified: Secondary | ICD-10-CM

## 2019-11-27 DIAGNOSIS — E559 Vitamin D deficiency, unspecified: Secondary | ICD-10-CM

## 2019-11-27 DIAGNOSIS — Z6833 Body mass index (BMI) 33.0-33.9, adult: Secondary | ICD-10-CM | POA: Diagnosis not present

## 2019-11-27 DIAGNOSIS — E538 Deficiency of other specified B group vitamins: Secondary | ICD-10-CM

## 2019-11-27 NOTE — Progress Notes (Signed)
Chief Complaint:   OBESITY David Newman is here to discuss his progress with his obesity treatment plan along with follow-up of his obesity related diagnoses. David Newman is keeping a food journal and adhering to recommended goals of 1800-2200 calories and 135+ grams of protein and states he is following his eating plan approximately 95-100% of the time. David Newman states he is biking/weights 20 minutes 7 times per week.  Today's visit was #: 4 Starting weight: 249 lbs Starting date: 10/16/2019 Today's weight: 237 lbs Today's date: 11/27/2019 Total lbs lost to date: 12 Total lbs lost since last in-office visit: 3  Interim History: David Newman has been hitting his calorie and protein goals 95-100% of the time and tracking 100% of the time. [place] his [place] move and he stayed on plan by focusing on protein and vegetables - great job! His ultimate goal is to weigh less than 200 lbs.  Subjective:   Vitamin D deficiency. David Newman is on prescription strength Vitamin D supplementation and denies nausea, vomiting, or muscle weakness.    Ref. Range 10/16/2019 15:13  Vitamin D, 25-Hydroxy Latest Ref Range: 30.0 - 100.0 ng/mL 16.4 (L)   B12 deficiency. David Newman is on oral B12 supplementation and denies excessive fatigue. He denies previous weight loss surgery and is not on metformin.  Lab Results  Component Value Date   VITAMINB12 202 (L) 10/16/2019   Assessment/Plan:   Vitamin D deficiency. Low Vitamin D level contributes to fatigue and are associated with obesity, breast, and colon cancer. He agrees to continue to take prescription Vitamin D as directed (no medication refill today) and will follow-up for routine testing of Vitamin D every 3 months.  B12 deficiency. The diagnosis was reviewed with the patient. Counseling provided today, see below. We will continue to monitor. Orders and follow up as documented in patient record. David Newman will continue oral  B12 supplementation as directed and will have labs checked every 3 months.  Counseling . The body needs vitamin B12: to make red blood cells; to make DNA; and to help the nerves work properly so they can carry messages from the brain to the body.  . The main causes of vitamin B12 deficiency include dietary deficiency, digestive diseases, pernicious anemia, and having a surgery in which part of the stomach or small intestine is removed.  . Certain medicines can make it harder for the body to absorb vitamin B12. These medicines include: heartburn medications; some antibiotics; some medications used to treat diabetes, gout, and high cholesterol.  . In some cases, there are no symptoms of this condition. If the condition leads to anemia or nerve damage, various symptoms can occur, such as weakness or fatigue, shortness of breath, and numbness or tingling in your hands and feet.   . Treatment:  o May include taking vitamin B12 supplements.  o Avoid alcohol.  o Eat lots of healthy foods that contain vitamin B12: - Beef, pork, chicken, [place], and organ meats, such as liver.  - Seafood: This includes clams, rainbow trout, salmon, tuna, and haddock.  - Eggs.  - Cereal and dairy products that are fortified: This means that vitamin B12 has been added to the food.   Class 1 obesity with serious comorbidity and body mass index (BMI) of 33.0 to 33.9 in adult, unspecified obesity type.  David Newman is currently in the action stage of change. As such, his goal is to continue with weight loss efforts. He has agreed to keeping  a food journal and adhering to recommended goals of 1800-2200 calories and 135+ grams of protein daily.   Exercise goals: David Newman will continue his current exercise regimen.   Behavioral modification strategies: increasing lean protein intake, meal planning and cooking strategies, planning for success and keeping a strict food journal.  David Newman has agreed to follow-up with our clinic in  2 weeks. He was informed of the importance of frequent follow-up visits to maximize his success with intensive lifestyle modifications for his multiple health conditions.   Objective:   Blood pressure 109/74, pulse 76, temperature 98 F (36.7 C), temperature source Oral, height 5' 11"  (1.803 m), weight 237 lb (107.5 kg), SpO2 97 %. Body mass index is 33.05 kg/m.  General: Cooperative, alert, well developed, in no acute distress. HEENT: Conjunctivae and lids unremarkable. Cardiovascular: Regular rhythm.  Lungs: Normal work of breathing. Neurologic: No focal deficits.   Lab Results  Component Value Date   CREATININE 1.20 10/16/2019   BUN 21 10/16/2019   NA 140 10/16/2019   K 4.9 10/16/2019   CL 101 10/16/2019   CO2 23 10/16/2019   Lab Results  Component Value Date   ALT 36 10/16/2019   AST 31 10/16/2019   ALKPHOS 96 10/16/2019   BILITOT 0.5 10/16/2019   No results found for: HGBA1C No results found for: INSULIN Lab Results  Component Value Date   TSH 1.740 10/16/2019   No results found for: CHOL, HDL, LDLCALC, LDLDIRECT, TRIG, CHOLHDL Lab Results  Component Value Date   WBC 5.0 10/16/2019   HGB 14.5 10/16/2019   HCT 43.6 10/16/2019   MCV 91 10/16/2019   PLT 278 10/16/2019   No results found for: IRON, TIBC, FERRITIN  Attestation Statements:   Reviewed by clinician on day of visit: allergies, medications, problem list, medical history, surgical history, family history, social history, and previous encounter notes.  Time spent on visit including pre-visit chart review and post-visit charting and care was 29 minutes.   I, Michaelene Song, am acting as Location manager for [employer], NP-C   I have reviewed the above documentation for accuracy and completeness, and I agree with the above. -  Esaw Grandchild, NP

## 2019-12-11 ENCOUNTER — Ambulatory Visit (INDEPENDENT_AMBULATORY_CARE_PROVIDER_SITE_OTHER): Payer: 59 | Admitting: Family Medicine

## 2019-12-12 ENCOUNTER — Ambulatory Visit (INDEPENDENT_AMBULATORY_CARE_PROVIDER_SITE_OTHER): Payer: 59 | Admitting: Family Medicine

## 2019-12-12 ENCOUNTER — Encounter (INDEPENDENT_AMBULATORY_CARE_PROVIDER_SITE_OTHER): Payer: Self-pay | Admitting: Family Medicine

## 2019-12-12 ENCOUNTER — Other Ambulatory Visit: Payer: Self-pay

## 2019-12-12 VITALS — BP 117/70 | HR 56 | Temp 98.1°F | Ht 71.0 in | Wt 232.0 lb

## 2019-12-12 DIAGNOSIS — E559 Vitamin D deficiency, unspecified: Secondary | ICD-10-CM

## 2019-12-12 DIAGNOSIS — Z6832 Body mass index (BMI) 32.0-32.9, adult: Secondary | ICD-10-CM

## 2019-12-12 DIAGNOSIS — E669 Obesity, unspecified: Secondary | ICD-10-CM | POA: Diagnosis not present

## 2019-12-13 ENCOUNTER — Encounter (INDEPENDENT_AMBULATORY_CARE_PROVIDER_SITE_OTHER): Payer: Self-pay | Admitting: Family Medicine

## 2019-12-13 MED ORDER — VITAMIN D (ERGOCALCIFEROL) 1.25 MG (50000 UNIT) PO CAPS: 50000.0000 [IU] | ORAL_CAPSULE | ORAL | 0 refills | Status: DC

## 2019-12-13 NOTE — Telephone Encounter (Signed)
Please advise 

## 2019-12-13 NOTE — Progress Notes (Signed)
Chief Complaint:   OBESITY David Newman is here to discuss his progress with his obesity treatment plan along with follow-up of his obesity related diagnoses. David Newman is on keeping a food journal and adhering to recommended goals of 1800-2200 calories and 135+ grams of protein daily and states he is following his eating plan approximately 90-95% of the time. David Newman states he is doing knee and back, and riding stationary bike, and bowflex for 15-20 minutes 7 times per week.  Today's visit was #: 5 Starting weight: 249 lbs Starting date: 10/16/2019 Today's weight: 232 lbs Today's date: 12/12/2019 Total lbs lost to date: 17 Total lbs lost since last in-office visit: 5  Interim History: David Newman continues to do well with journaling and weight loss. He struggles to meet his protein goals but is doing better. He has decreased carbohydrates but he is also decreasing healthier carbohydrates like beans and vegetables.  Subjective:   1. Vitamin D deficiency David Newman is stable on Vit D, but his Vit D level is note yet at goal. He denies nausea or vomiting.  Assessment/Plan:   1. Vitamin D deficiency Low Vitamin D level contributes to fatigue and are associated with obesity, breast, and colon cancer. We will refill prescription Vitamin D for 1 month. David Newman will follow-up for routine testing of Vitamin D, at least 2-3 times per year to avoid over-replacement.  - Vitamin D, Ergocalciferol, (DRISDOL) 1.25 MG (50000 UNIT) CAPS capsule; Take 1 capsule (50,000 Units total) by mouth every 7 (seven) days.  Dispense: 4 capsule; Refill: 0  2. Class 1 obesity with serious comorbidity and body mass index (BMI) of 32.0 to 32.9 in adult, unspecified obesity type David Newman is currently in the action stage of change. As such, his goal is to continue with weight loss efforts. He has agreed to keeping a food journal and adhering to recommended goals of 1800-2200 calories and 135+ grams of protein daily. Less  than 60+ grams of sugar daily with no carbohydrate goal.  Exercise goals: As is.  Behavioral modification strategies: increasing lean protein intake.  David Newman has agreed to follow-up with our clinic in 2 weeks. He was informed of the importance of frequent follow-up visits to maximize his success with intensive lifestyle modifications for his multiple health conditions.   Objective:   Blood pressure 117/70, pulse 56, temperature 98.1 F (36.7 C), temperature source Oral, height 5' 11"  (1.803 m), weight (!) 232 lb (105.2 kg), SpO2 96 %. Body mass index is 32.36 kg/m.  General: Cooperative, alert, well developed, in no acute distress. HEENT: Conjunctivae and lids unremarkable. Cardiovascular: Regular rhythm.  Lungs: Normal work of breathing. Neurologic: No focal deficits.   Lab Results  Component Value Date   CREATININE 1.20 10/16/2019   BUN 21 10/16/2019   NA 140 10/16/2019   K 4.9 10/16/2019   CL 101 10/16/2019   CO2 23 10/16/2019   Lab Results  Component Value Date   ALT 36 10/16/2019   AST 31 10/16/2019   ALKPHOS 96 10/16/2019   BILITOT 0.5 10/16/2019   No results found for: HGBA1C No results found for: INSULIN Lab Results  Component Value Date   TSH 1.740 10/16/2019   No results found for: CHOL, HDL, LDLCALC, LDLDIRECT, TRIG, CHOLHDL Lab Results  Component Value Date   WBC 5.0 10/16/2019   HGB 14.5 10/16/2019   HCT 43.6 10/16/2019   MCV 91 10/16/2019   PLT 278 10/16/2019   No results found for: IRON, TIBC, FERRITIN  Attestation Statements:  Reviewed by clinician on day of visit: allergies, medications, problem list, medical history, surgical history, family history, social history, and previous encounter notes.  Time spent on visit including pre-visit chart review and post-visit care and charting was 35 minutes.    I, Trixie Dredge, am acting as transcriptionist for Dennard Nip, MD.  I have reviewed the above documentation for accuracy and  completeness, and I agree with the above. -  Dennard Nip, MD

## 2019-12-25 ENCOUNTER — Encounter (INDEPENDENT_AMBULATORY_CARE_PROVIDER_SITE_OTHER): Payer: Self-pay | Admitting: Family Medicine

## 2019-12-25 ENCOUNTER — Ambulatory Visit (INDEPENDENT_AMBULATORY_CARE_PROVIDER_SITE_OTHER): Payer: 59 | Admitting: Family Medicine

## 2019-12-25 ENCOUNTER — Other Ambulatory Visit: Payer: Self-pay

## 2019-12-25 VITALS — BP 138/76 | HR 64 | Temp 98.2°F | Ht 71.0 in | Wt 231.0 lb

## 2019-12-25 DIAGNOSIS — R7303 Prediabetes: Secondary | ICD-10-CM

## 2019-12-25 DIAGNOSIS — E669 Obesity, unspecified: Secondary | ICD-10-CM

## 2019-12-25 DIAGNOSIS — Z6832 Body mass index (BMI) 32.0-32.9, adult: Secondary | ICD-10-CM | POA: Diagnosis not present

## 2019-12-26 ENCOUNTER — Encounter (INDEPENDENT_AMBULATORY_CARE_PROVIDER_SITE_OTHER): Payer: Self-pay | Admitting: Family Medicine

## 2019-12-26 ENCOUNTER — Ambulatory Visit (INDEPENDENT_AMBULATORY_CARE_PROVIDER_SITE_OTHER): Payer: 59 | Admitting: Family Medicine

## 2019-12-26 NOTE — Progress Notes (Signed)
Chief Complaint:   OBESITY David Newman is here to discuss his progress with his obesity treatment plan along with follow-up of his obesity related diagnoses. David Newman is on keeping a food journal and adhering to recommended goals of 1800-2200 calories and 135+ grams of protein daily and states he is following his eating plan approximately 85-90% of the time. David Newman states he is riding the stationary bike for 20 minutes 1 time per week.  Today's visit was #: 6 Starting weight: 249 lbs Starting date: 10/16/2019 Today's weight: 231 lbs Today's date: 12/25/2019 Total lbs lost to date: 18 Total lbs lost since last in-office visit: 1  Interim History: David Newman did not journal as much the past several days. He has eaten out quite a bit and tried to make good choices. He is a bit frustrated with only 1 lb weight loss. He has done very well on the plan losing 19 lbs over the last 10 weeks.  His hunger is satisfied. He averages between 120 and 135 grams of protein daily.  Subjective:   1. Pre-diabetes David Newman has a diagnosis of pre-diabetes based on his elevated Hgb A1c and was informed this puts him at greater risk of developing diabetes. Last A1c was 5.9 on 09/25/2019. He is not on metformin, and he denies polyphagia. He continues to work on diet and exercise to decrease his risk of diabetes.   No results found for: HGBA1C No results found for: INSULIN  Assessment/Plan:   1. Pre-diabetes David Newman will continue his meal plan, and will continue to work on weight loss, exercise, and decreasing simple carbohydrates to help decrease the risk of diabetes.   2. Class 1 obesity with serious comorbidity and body mass index (BMI) of 32.0 to 32.9 in adult, unspecified obesity type David Newman is currently in the action stage of change. As such, his goal is to continue with weight loss efforts. He has agreed to keeping a food journal and adhering to recommended goals of 1800-2200 calories and 135 grams of  protein daily. <60 grams of sugar.   Exercise goals: As is.  Behavioral modification strategies: decreasing eating out, meal planning and cooking strategies, travel eating strategies and planning for success.  David Newman has agreed to follow-up with our clinic in 2 weeks. He was informed of the importance of frequent follow-up visits to maximize his success with intensive lifestyle modifications for his multiple health conditions.   Objective:   Blood pressure 138/76, pulse 64, temperature 98.2 F (36.8 C), temperature source Oral, height 5' 11"  (1.803 m), weight 231 lb (104.8 kg), SpO2 97 %. Body mass index is 32.22 kg/m.  General: Cooperative, alert, well developed, in no acute distress. HEENT: Conjunctivae and lids unremarkable. Cardiovascular: Regular rhythm.  Lungs: Normal work of breathing. Neurologic: No focal deficits.   Lab Results  Component Value Date   CREATININE 1.20 10/16/2019   BUN 21 10/16/2019   NA 140 10/16/2019   K 4.9 10/16/2019   CL 101 10/16/2019   CO2 23 10/16/2019   Lab Results  Component Value Date   ALT 36 10/16/2019   AST 31 10/16/2019   ALKPHOS 96 10/16/2019   BILITOT 0.5 10/16/2019   No results found for: HGBA1C No results found for: INSULIN Lab Results  Component Value Date   TSH 1.740 10/16/2019   No results found for: CHOL, HDL, LDLCALC, LDLDIRECT, TRIG, CHOLHDL Lab Results  Component Value Date   WBC 5.0 10/16/2019   HGB 14.5 10/16/2019   HCT 43.6 10/16/2019  MCV 91 10/16/2019   PLT 278 10/16/2019   No results found for: IRON, TIBC, FERRITIN  Attestation Statements:   Reviewed by clinician on day of visit: allergies, medications, problem list, medical history, surgical history, family history, social history, and previous encounter notes.   David Newman, am acting as Location manager for [employer], FNP-C.  I have reviewed the above documentation for accuracy and completeness, and I agree with the above. -  David Fick, FNP

## 2020-01-02 ENCOUNTER — Telehealth: Payer: Self-pay | Admitting: Internal Medicine

## 2020-01-02 NOTE — Telephone Encounter (Signed)
Pt aware.

## 2020-01-02 NOTE — Telephone Encounter (Signed)
See note below and advise. 

## 2020-01-02 NOTE — Telephone Encounter (Signed)
Not immunocompromised.  Agree with plans for the booster when generally recommended

## 2020-01-09 ENCOUNTER — Ambulatory Visit (INDEPENDENT_AMBULATORY_CARE_PROVIDER_SITE_OTHER): Payer: 59 | Admitting: Family Medicine

## 2020-01-09 ENCOUNTER — Other Ambulatory Visit: Payer: Self-pay

## 2020-01-09 ENCOUNTER — Other Ambulatory Visit (INDEPENDENT_AMBULATORY_CARE_PROVIDER_SITE_OTHER): Payer: Self-pay | Admitting: Family Medicine

## 2020-01-09 ENCOUNTER — Encounter (INDEPENDENT_AMBULATORY_CARE_PROVIDER_SITE_OTHER): Payer: Self-pay | Admitting: Family Medicine

## 2020-01-09 VITALS — BP 115/70 | HR 56 | Temp 98.3°F | Ht 71.0 in | Wt 227.0 lb

## 2020-01-09 DIAGNOSIS — Z6831 Body mass index (BMI) 31.0-31.9, adult: Secondary | ICD-10-CM | POA: Diagnosis not present

## 2020-01-09 DIAGNOSIS — E559 Vitamin D deficiency, unspecified: Secondary | ICD-10-CM

## 2020-01-09 DIAGNOSIS — E669 Obesity, unspecified: Secondary | ICD-10-CM

## 2020-01-09 DIAGNOSIS — Z9189 Other specified personal risk factors, not elsewhere classified: Secondary | ICD-10-CM | POA: Diagnosis not present

## 2020-01-09 MED ORDER — VITAMIN D (ERGOCALCIFEROL) 1.25 MG (50000 UNIT) PO CAPS: 50000.0000 [IU] | ORAL_CAPSULE | ORAL | 0 refills | Status: DC

## 2020-01-09 NOTE — Progress Notes (Signed)
Chief Complaint:   OBESITY David Newman is here to discuss his progress with his obesity treatment plan along with follow-up of his obesity related diagnoses. David Newman is on keeping a food journal and adhering to recommended goals of 1800-2200 calories and 135 grams of protein daily and states he is following his eating plan approximately 90-95% of the time. David Newman states he is doing stretching, bowflex, and cardio for 45-60 minutes 7 times per week.  Today's visit was #: 7 Starting weight: 249 lbs Starting date: 10/16/2019 Today's weight: 227 lbs Today's date: 01/09/2020 Total lbs lost to date: 22 Total lbs lost since last in-office visit: 4  Interim History: David Newman continues to do well with weight loss. He did some traveling and increase eating out, but was mindful and made better choices than he could have.  Subjective:   1. Vitamin D deficiency David Newman is stable on Vit D, and he denies nausea or vomiting.  2. At risk for dehydration David Newman is at risk for dehydration due to outdoor exercise.  Assessment/Plan:   1. Vitamin D deficiency Low Vitamin D level contributes to fatigue and are associated with obesity, breast, and colon cancer. We will refill prescription Vitamin D for 1 month. David Newman will follow-up for routine testing of Vitamin D, at least 2-3 times per year to avoid over-replacement.  - Vitamin D, Ergocalciferol, (DRISDOL) 1.25 MG (50000 UNIT) CAPS capsule; Take 1 capsule (50,000 Units total) by mouth every 7 (seven) days.  Dispense: 4 capsule; Refill: 0  2. At risk for dehydration David Newman was given approximately 15 minutes dehydration prevention counseling today. David Newman is at risk for dehydration due to weight loss and current medication(s). He was encouraged to hydrate and monitor fluid status to avoid dehydration as well as weight loss plateaus.   3. Class 1 obesity with serious comorbidity and body mass index (BMI) of 31.0 to 31.9 in adult, unspecified  obesity type David Newman is currently in the action stage of change. As such, his goal is to continue with weight loss efforts. He has agreed to keeping a food journal and adhering to recommended goals of 1800-2200 calories and 135+ grams of protein daily.   Exercise goals: As is.  Behavioral modification strategies: meal planning and cooking strategies.  David Newman has agreed to follow-up with our clinic in 2 to 3 weeks. He was informed of the importance of frequent follow-up visits to maximize his success with intensive lifestyle modifications for his multiple health conditions.   Objective:   Blood pressure 115/70, pulse (!) 56, temperature 98.3 F (36.8 C), temperature source Oral, height 5' 11"  (1.803 m), weight 227 lb (103 kg), SpO2 96 %. Body mass index is 31.66 kg/m.  General: Cooperative, alert, well developed, in no acute distress. HEENT: Conjunctivae and lids unremarkable. Cardiovascular: Regular rhythm.  Lungs: Normal work of breathing. Neurologic: No focal deficits.   Lab Results  Component Value Date   CREATININE 1.20 10/16/2019   BUN 21 10/16/2019   NA 140 10/16/2019   K 4.9 10/16/2019   CL 101 10/16/2019   CO2 23 10/16/2019   Lab Results  Component Value Date   ALT 36 10/16/2019   AST 31 10/16/2019   ALKPHOS 96 10/16/2019   BILITOT 0.5 10/16/2019   No results found for: HGBA1C No results found for: INSULIN Lab Results  Component Value Date   TSH 1.740 10/16/2019   No results found for: CHOL, HDL, LDLCALC, LDLDIRECT, TRIG, CHOLHDL Lab Results  Component Value Date   WBC  5.0 10/16/2019   HGB 14.5 10/16/2019   HCT 43.6 10/16/2019   MCV 91 10/16/2019   PLT 278 10/16/2019   No results found for: IRON, TIBC, FERRITIN  Attestation Statements:   Reviewed by clinician on day of visit: allergies, medications, problem list, medical history, surgical history, family history, social history, and previous encounter notes.   I, Trixie Dredge, am acting as  transcriptionist for Dennard Nip, MD.  I have reviewed the above documentation for accuracy and completeness, and I agree with the above. -  Dennard Nip, MD

## 2020-01-23 ENCOUNTER — Other Ambulatory Visit: Payer: Self-pay

## 2020-01-23 ENCOUNTER — Ambulatory Visit (INDEPENDENT_AMBULATORY_CARE_PROVIDER_SITE_OTHER): Payer: 59 | Admitting: Family Medicine

## 2020-01-23 ENCOUNTER — Encounter (INDEPENDENT_AMBULATORY_CARE_PROVIDER_SITE_OTHER): Payer: Self-pay | Admitting: Family Medicine

## 2020-01-23 VITALS — BP 126/74 | HR 67 | Temp 98.3°F | Ht 71.0 in | Wt 229.0 lb

## 2020-01-23 DIAGNOSIS — E559 Vitamin D deficiency, unspecified: Secondary | ICD-10-CM

## 2020-01-23 DIAGNOSIS — E538 Deficiency of other specified B group vitamins: Secondary | ICD-10-CM

## 2020-01-23 DIAGNOSIS — Z6832 Body mass index (BMI) 32.0-32.9, adult: Secondary | ICD-10-CM

## 2020-01-23 DIAGNOSIS — E7849 Other hyperlipidemia: Secondary | ICD-10-CM | POA: Diagnosis not present

## 2020-01-23 DIAGNOSIS — R7303 Prediabetes: Secondary | ICD-10-CM

## 2020-01-23 DIAGNOSIS — K5180 Other ulcerative colitis without complications: Secondary | ICD-10-CM

## 2020-01-23 DIAGNOSIS — E669 Obesity, unspecified: Secondary | ICD-10-CM | POA: Diagnosis not present

## 2020-01-23 DIAGNOSIS — Z9189 Other specified personal risk factors, not elsewhere classified: Secondary | ICD-10-CM

## 2020-01-23 DIAGNOSIS — E65 Localized adiposity: Secondary | ICD-10-CM

## 2020-01-23 NOTE — Progress Notes (Signed)
Chief Complaint:   OBESITY David Newman is here to discuss his progress with his obesity treatment plan along with follow-up of his obesity related diagnoses. David Newman is on keeping a food journal and adhering to recommended goals of 1800-2200 calories and 135 grams of protein and states he is following his eating plan approximately 90-95% of the time. David Newman states he is strength training and walking 1-1.5 miles 30+ minutes 3-7 times per week.  Today's visit was #: 8 Starting weight: 249 lbs Starting date: 10/16/2019 Today's weight: 229 lbs Today's date: 01/23/2020 Total lbs lost to date: 20 lbs Total lbs lost since last in-office visit: 2 lbs Total weight loss percentage to date: -8.03%  Interim History: David Newman is getting in around 1750 calories per day with protein average in the 120s.  Carbohydrates are in the low 100s and fiber is 25-30.  He has been using his Boflex, and says he is ready advance the weight.  Assessment/Plan:   1. Vitamin D deficiency Current vitamin D is 16.4, tested on 10/16/2019. Not at goal. Optimal goal > 50 ng/dL. There is also evidence to support a goal of >70 ng/dL in patients with cancer and heart disease. Plan: Continue Vitamin D @50 ,000 IU every week with follow-up for routine testing of Vitamin D at least 2-3 times per year to avoid over-replacement.  - VITAMIN D 25 Hydroxy (Vit-D Deficiency, Fractures)  2. B12 deficiency Not at goal. Goal > 400. Recheck lab work today.   Lab Results  Component Value Date   VITAMINB12 202 (L) 10/16/2019   - CBC with Differential/Platelet - Vitamin B12  3. Prediabetes Initial A1c on 09/25/19 (see CareEverywhere) was 5.9. Now at goal. The current medical regimen is effective;  continue present plan.  Lab Results  Component Value Date   HGBA1C 5.5 01/23/2020   Lab Results  Component Value Date   LDLCALC 143 (H) 01/23/2020   CREATININE 1.17 01/23/2020   - Comprehensive metabolic panel - Hemoglobin A1c -  Insulin, Free (Bioactive)  4. Visceral obesity Improving. Visceral adipose tissue is a hormonally active component of total body fat. This body composition phenotype is associated with medical disorders such as metabolic syndrome, cardiovascular disease and several malignancies including prostate, breast, and colorectal cancers. Goal: Lose 7-10% of starting weight. Visceral fat rating should be < 13.  5. Other hyperlipidemia Cardiovascular risk and specific lipid/LDL goals reviewed.  We discussed several lifestyle modifications today and David Newman will continue to work on diet, exercise and weight loss efforts. Orders and follow up as documented in patient record.   Lab Results  Component Value Date   ALT 36 10/16/2019   AST 31 10/16/2019   ALKPHOS 96 10/16/2019   BILITOT 0.5 10/16/2019   - Comprehensive metabolic panel - Lipid panel  6. 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 Henrene Pastor.  He takes Lialda.  - CBC with Differential/Platelet - Comprehensive metabolic panel  7. At risk for malnutrition David Newman is at risk for malnutrition due to ulcerative colitis.  He was given approximately 15 minutes of counseling today regarding prevention of malnutrition and ways to meet macronutrient goals.  8. Class 1 obesity with serious comorbidity and body mass index (BMI) of 32.0 to 32.9 in adult, unspecified obesity type David Newman is currently in the action stage of change. As such, his goal is to continue with weight loss efforts. He has agreed to keeping a food journal and adhering to recommended goals of 1800-2200 calories and 135  grams of protein.   Exercise goals: For substantial health benefits, adults should do at least 150 minutes (2 hours and 30 minutes) a week of moderate-intensity, or 75 minutes (1 hour and 15 minutes) a week of vigorous-intensity aerobic physical activity, or an equivalent combination of moderate- and vigorous-intensity aerobic  activity. Aerobic activity should be performed in episodes of at least 10 minutes, and preferably, it should be spread throughout the week.  Behavioral modification strategies: increasing lean protein intake, decreasing simple carbohydrates, increasing vegetables, increasing water intake and travel eating strategies.  David Newman has agreed to follow-up with our clinic in 2-3 weeks. He was informed of the importance of frequent follow-up visits to maximize his success with intensive lifestyle modifications for his multiple health conditions.   David Newman was informed we would discuss his lab results at his next visit unless there is a critical issue that needs to be addressed sooner. David Newman agreed to keep his next visit at the agreed upon time to discuss these results.  Objective:   Blood pressure 126/74, pulse 67, temperature 98.3 F (36.8 C), temperature source Oral, height 5' 11"  (1.803 m), weight 229 lb (103.9 kg), SpO2 96 %. Body mass index is 31.94 kg/m.  General: Cooperative, alert, well developed, in no acute distress. HEENT: Conjunctivae and lids unremarkable. Cardiovascular: Regular rhythm.  Lungs: Normal work of breathing. Neurologic: No focal deficits.   Lab Results  Component Value Date   CREATININE 1.20 10/16/2019   BUN 21 10/16/2019   NA 140 10/16/2019   K 4.9 10/16/2019   CL 101 10/16/2019   CO2 23 10/16/2019   Lab Results  Component Value Date   ALT 36 10/16/2019   AST 31 10/16/2019   ALKPHOS 96 10/16/2019   BILITOT 0.5 10/16/2019   Lab Results  Component Value Date   TSH 1.740 10/16/2019   Lab Results  Component Value Date   WBC 5.0 10/16/2019   HGB 14.5 10/16/2019   HCT 43.6 10/16/2019   MCV 91 10/16/2019   PLT 278 10/16/2019   Attestation Statements:   Reviewed by clinician on day of visit: allergies, medications, problem list, medical history, surgical history, family history, social history, and previous encounter notes.  I, Water quality scientist, CMA, am  acting as transcriptionist for Briscoe Deutscher, DO  I have reviewed the above documentation for accuracy and completeness, and I agree with the above. Briscoe Deutscher, DO

## 2020-01-24 LAB — CBC WITH DIFFERENTIAL/PLATELET
Basophils Absolute: 0.1 10*3/uL (ref 0.0–0.2)
Basos: 1 %
EOS (ABSOLUTE): 0.3 10*3/uL (ref 0.0–0.4)
Eos: 5 %
Hematocrit: 43.8 % (ref 37.5–51.0)
Hemoglobin: 14.6 g/dL (ref 13.0–17.7)
Immature Grans (Abs): 0 10*3/uL (ref 0.0–0.1)
Immature Granulocytes: 1 %
Lymphocytes Absolute: 1.2 10*3/uL (ref 0.7–3.1)
Lymphs: 17 %
MCH: 29.4 pg (ref 26.6–33.0)
MCHC: 33.3 g/dL (ref 31.5–35.7)
MCV: 88 fL (ref 79–97)
Monocytes Absolute: 0.7 10*3/uL (ref 0.1–0.9)
Monocytes: 9 %
Neutrophils Absolute: 4.8 10*3/uL (ref 1.4–7.0)
Neutrophils: 67 %
Platelets: 269 10*3/uL (ref 150–450)
RBC: 4.97 x10E6/uL (ref 4.14–5.80)
RDW: 12.7 % (ref 11.6–15.4)
WBC: 7 10*3/uL (ref 3.4–10.8)

## 2020-01-24 LAB — COMPREHENSIVE METABOLIC PANEL
ALT: 23 IU/L (ref 0–44)
AST: 22 IU/L (ref 0–40)
Albumin/Globulin Ratio: 2.6 — ABNORMAL HIGH (ref 1.2–2.2)
Albumin: 4.9 g/dL (ref 3.8–4.9)
Alkaline Phosphatase: 96 IU/L (ref 48–121)
BUN/Creatinine Ratio: 20 (ref 9–20)
BUN: 23 mg/dL (ref 6–24)
Bilirubin Total: 0.5 mg/dL (ref 0.0–1.2)
CO2: 28 mmol/L (ref 20–29)
Calcium: 10.1 mg/dL (ref 8.7–10.2)
Chloride: 100 mmol/L (ref 96–106)
Creatinine, Ser: 1.17 mg/dL (ref 0.76–1.27)
GFR calc Af Amer: 79 mL/min/{1.73_m2} (ref >59)
GFR calc non Af Amer: 68 mL/min/{1.73_m2} (ref >59)
Globulin, Total: 1.9 g/dL (ref 1.5–4.5)
Glucose: 112 mg/dL — ABNORMAL HIGH (ref 65–99)
Potassium: 5.1 mmol/L (ref 3.5–5.2)
Sodium: 140 mmol/L (ref 134–144)
Total Protein: 6.8 g/dL (ref 6.0–8.5)

## 2020-01-24 LAB — LIPID PANEL
Chol/HDL Ratio: 4.2 ratio (ref 0.0–5.0)
Cholesterol, Total: 238 mg/dL — ABNORMAL HIGH (ref 100–199)
HDL: 57 mg/dL (ref >39)
LDL Chol Calc (NIH): 143 mg/dL — ABNORMAL HIGH (ref 0–99)
Triglycerides: 212 mg/dL — ABNORMAL HIGH (ref 0–149)
VLDL Cholesterol Cal: 38 mg/dL (ref 5–40)

## 2020-01-24 LAB — VITAMIN B12: Vitamin B-12: 341 pg/mL (ref 232–1245)

## 2020-01-24 LAB — VITAMIN D 25 HYDROXY (VIT D DEFICIENCY, FRACTURES): Vit D, 25-Hydroxy: 45.7 ng/mL (ref 30.0–100.0)

## 2020-01-24 LAB — HEMOGLOBIN A1C
Est. average glucose Bld gHb Est-mCnc: 111 mg/dL
Hgb A1c MFr Bld: 5.5 % (ref 4.8–5.6)

## 2020-02-06 ENCOUNTER — Encounter (INDEPENDENT_AMBULATORY_CARE_PROVIDER_SITE_OTHER): Payer: Self-pay | Admitting: Family Medicine

## 2020-02-06 ENCOUNTER — Other Ambulatory Visit (INDEPENDENT_AMBULATORY_CARE_PROVIDER_SITE_OTHER): Payer: 59

## 2020-02-06 ENCOUNTER — Other Ambulatory Visit: Payer: Self-pay | Admitting: Internal Medicine

## 2020-02-06 ENCOUNTER — Encounter: Payer: Self-pay | Admitting: Internal Medicine

## 2020-02-06 ENCOUNTER — Ambulatory Visit: Payer: 59 | Admitting: Internal Medicine

## 2020-02-06 ENCOUNTER — Other Ambulatory Visit: Payer: Self-pay

## 2020-02-06 ENCOUNTER — Ambulatory Visit (INDEPENDENT_AMBULATORY_CARE_PROVIDER_SITE_OTHER): Payer: 59 | Admitting: Family Medicine

## 2020-02-06 VITALS — BP 116/75 | HR 62 | Temp 98.1°F | Ht 71.0 in | Wt 227.0 lb

## 2020-02-06 DIAGNOSIS — K5180 Other ulcerative colitis without complications: Secondary | ICD-10-CM | POA: Diagnosis not present

## 2020-02-06 DIAGNOSIS — E538 Deficiency of other specified B group vitamins: Secondary | ICD-10-CM

## 2020-02-06 DIAGNOSIS — E669 Obesity, unspecified: Secondary | ICD-10-CM

## 2020-02-06 DIAGNOSIS — E559 Vitamin D deficiency, unspecified: Secondary | ICD-10-CM

## 2020-02-06 DIAGNOSIS — K51311 Ulcerative (chronic) rectosigmoiditis with rectal bleeding: Secondary | ICD-10-CM | POA: Diagnosis not present

## 2020-02-06 DIAGNOSIS — Z6831 Body mass index (BMI) 31.0-31.9, adult: Secondary | ICD-10-CM

## 2020-02-06 DIAGNOSIS — Z1211 Encounter for screening for malignant neoplasm of colon: Secondary | ICD-10-CM

## 2020-02-06 DIAGNOSIS — Z9189 Other specified personal risk factors, not elsewhere classified: Secondary | ICD-10-CM | POA: Diagnosis not present

## 2020-02-06 DIAGNOSIS — E782 Mixed hyperlipidemia: Secondary | ICD-10-CM

## 2020-02-06 LAB — IGA: IgA: 138 mg/dL (ref 68–378)

## 2020-02-06 MED ORDER — SUTAB 1479-225-188 MG PO TABS: 1.0000 | ORAL_TABLET | Freq: Once | ORAL | 0 refills | Status: AC

## 2020-02-06 MED ORDER — MESALAMINE 4 G RE ENEM: ENEMA | RECTAL | 11 refills | Status: DC

## 2020-02-06 MED ORDER — MESALAMINE 1.2 G PO TBEC: DELAYED_RELEASE_TABLET | ORAL | 3 refills | Status: DC

## 2020-02-06 NOTE — Patient Instructions (Signed)
Your provider has requested that you go to the basement level for lab work before leaving today. Press "B" on the elevator. The lab is located at the first door on the left as you exit the elevator.   We have sent the following medications to your pharmacy for you to pick up at your convenience:  Cameron have been scheduled for a colonoscopy. Please follow written instructions given to you at your visit today.  Please pick up your prep supplies at the pharmacy within the next 1-3 days. If you use inhalers (even only as needed), please bring them with you on the day of your procedure.

## 2020-02-06 NOTE — Progress Notes (Signed)
HISTORY OF PRESENT ILLNESS:  David Newman is a 59 y.o. 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  Therapies have included prednisone, oral mesalamine, and topical mesalamine.  He was last seen in the office July 18, 2019 after a recent flare of disease.  In addition to his baseline Lialda 4.8 g daily, he was treated with topical mesalamine prednisone taper.  Since that time he has done well.  Currently on the although 4.8 g daily.  He has been exercising and successfully lost 20 pounds.  No significant lower GI complaints.  He is due for his routine follow-up screening colonoscopy.  He does tell me that he is on replacement therapies for B12 and vitamin D deficiencies.  He has questions regarding possible Ozempic therapy.  He does request a refill of his mesalamine therapies.  He has completed his Covid vaccination series.  Review of blood work from January 24, 2020 shows unremarkable comprehensive metabolic panel.  Creatinine 1.17.  Normal liver tests.  Normal CBC with hemoglobin 14.6.  Vitamin D level 45.7.  B12 level 341.  REVIEW OF SYSTEMS:  All non-GI ROS negative unless otherwise stated in the HPI except for anxiety  Past Medical History:  Diagnosis Date  . Anal fissure   . At risk for sleep apnea    STOP-BANG= 5    SENT TO PCP 03-27-2014  . Back pain   . CAD (coronary artery disease)   . Colon polyps    hyperplastic  . DDD (degenerative disc disease), lumbar   . GERD (gastroesophageal reflux disease)   . Gout   . High blood pressure   . Hyperlipidemia   . Joint pain   . Knee pain   . OA (osteoarthritis) of knee   . Prediabetes   . Right knee meniscal tear   . Sleep apnea    Wears CPAP.  Marland Kitchen 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[place])   . Wears contact lenses   . Wears glasses     Past Surgical History:  Procedure Laterality Date  . COLONOSCOPY  04-24-2012  . FACIAL RECONSTRUCTION SURGERY  1973  age 63  . KNEE ARTHROSCOPY Right  03/29/2014   Procedure: RIGHT ARTHROSCOPY KNEE WITH DEBRIDMENT PARTIAL medial lateral MENISCECTOMY AND CHONDROPLASTY;  Surgeon: Sydnee Cabal, MD;  Location: [place];  Service: Orthopedics;  Laterality: Right;  . KNEE ARTHROSCOPY WITH ANTERIOR CRUCIATE LIGAMENT (ACL) REPAIR Right 1994  . KNEE ARTHROSCOPY WITH MEDIAL MENISECTOMY Left 12/28/2018   Procedure: KNEE ARTHROSCOPY WITH partial MEDIAL MENISECTOMY, debridement chondroplasty;  Surgeon: Sydnee Cabal, MD;  Location: [facility];  Service: Orthopedics;  Laterality: Left;  with knee block  . WISDOM TOOTH EXTRACTION  age 47    Social History David Newman  reports that he quit smoking about 12 years ago. His smoking use included cigarettes. He has a 35.00 pack-year smoking history. He has never used smokeless tobacco. He reports current alcohol use. He reports that he does not use drugs.  family history includes Alzheimer's disease in his mother; Colon cancer (age of onset: 57) in his cousin; Colon cancer (age of onset: 4) in his paternal uncle; Colon polyps (age of onset: 13) in his brother; Heart disease in his brother and father; Hyperlipidemia in his father; Hypertension in his father; Skin cancer in his father; Stroke in his father.  Allergies  Allergen Reactions  . Sulfa Antibiotics Nausea And Vomiting       PHYSICAL EXAMINATION: Vital signs: There were  no vitals taken for this visit.  Constitutional: generally well-appearing, no acute distress Psychiatric: alert and oriented x3, cooperative Eyes: extraocular movements intact, anicteric, conjunctiva pink Mouth: oral pharynx moist, no lesions Neck: supple no lymphadenopathy Cardiovascular: heart regular rate and rhythm, no murmur Lungs: clear to auscultation bilaterally Abdomen: soft, nontender, nondistended, no obvious ascites, no peritoneal signs, normal bowel sounds, no organomegaly Rectal: Deferred to colonoscopy Extremities: no  clubbing, cyanosis, or lower extremity edema bilaterally Skin: no lesions on visible extremities Neuro: No focal deficits.  Cranial nerves intact  ASSESSMENT:  1.  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 diagnosed December 2016.  Patient is in clinical remission 2.  Last colonoscopy December 2016.  Due for follow-up screening 3.  B12 and vitamin D deficiencies.  Being replaced   PLAN:  1.  Continue Lialda 4.8 g daily 2.  Refill Lialda.  Medication risks reviewed 3.  Refill Rowasa enemas. 4.  Schedule screening colonoscopy.The nature of the procedure, as well as the risks, benefits, and alternatives were carefully and thoroughly reviewed with the patient. Ample time for discussion and questions allowed. The patient understood, was satisfied, and agreed to proceed. 5.  Screen for celiac disease with vitamin deficiency 6.  Okay to use Ozempic from GI standpoint 7.  Continue iron replacement and blood monitoring her PCP

## 2020-02-07 LAB — TISSUE TRANSGLUTAMINASE, IGA: (tTG) Ab, IgA: <1 U/mL

## 2020-02-07 NOTE — Progress Notes (Signed)
Chief Complaint:   OBESITY David Newman is here to discuss his progress with his obesity treatment plan along with follow-up of his obesity related diagnoses. David Newman is on keeping a food journal and adhering to recommended goals of 1800-2200 calories and 135 grams of protein and states he is following his eating plan approximately 95% of the time. David Newman states he is doing strength training/cardio/walking for 20+ minutes 7 times per week.  Today's visit was #: 9 Starting weight: 249 lbs Starting date: 10/16/2019 Today's weight: 227 lbs Today's date: 02/06/2020 Total lbs lost to date: 22 lbs Total lbs lost since last in-office visit: 2 lbs Total weight loss percentage to date: -8.84%  Interim History: David Newman is getting in about 1800 calories and 115-120 grams of protein per day along with 4 big Yetis of water.  Sleep:  Uses a CPAP and gets about 7.5 hours per night. Stress:  Stable.  Heightened always. Medication changes: He was on prednisone for a few days.  ETOH: He went 54 days dry and had 2 days minimal ETOH. We discussed Saxenda/Wegovy and he will get clearance with GI re: UC precautions.   Assessment/Plan:   1. Vitamin D deficiency Current vitamin D is 45.7, tested on 01/23/2020. Not at goal. Optimal goal > 50 ng/dL. There is also evidence to support a goal of >70 ng/dL in patients with cancer and heart disease. Plan: Continue Vitamin D with follow-up for routine testing of Vitamin D at least 2-3 times per year to avoid over-replacement.  2. B12 deficiency Not optimized. Goal > 400. He has had some improvement with oral B12 but may benefit from injections. Addendum: Patient discussed with GI - okay to do.   Lab Results  Component Value Date   [id number] 619 01/23/2020   Meds ordered this encounter  Medications  . cyanocobalamin (,VITAMIN B-12,) 1000 MCG/ML injection    Sig: 1000 mcg (1 mg) injection once per week for four weeks, followed by 1000 mcg injection once per month.      Dispense:  1 mL    Refill:  15  . Syringe/Needle, Disp, (SYRINGE 3CC/25GX1") 25G X 1" 3 ML MISC    Sig: For B12 injections.    Dispense:  50 each    Refill:  0   3. Mixed hyperlipidemia  Lab Results  Component Value Date   CHOL 238 (H) 01/23/2020   HDL 57 01/23/2020   LDLCALC 143 (H) 01/23/2020   TRIG 212 (H) 01/23/2020   CHOLHDL 4.2 01/23/2020   Lab Results  Component Value Date   ALT 23 01/23/2020   AST 22 01/23/2020   ALKPHOS 96 01/23/2020   BILITOT 0.5 01/23/2020   The 10-year ASCVD risk score Mikey Bussing DC Jr., et al., 2013) is: 6.6%   Values used to calculate the score:     Age: 8 years     Sex: Male     Is Non-Hispanic African American: No     Diabetic: No     Tobacco smoker: No     Systolic Blood Pressure: 509 mmHg     Is BP treated: No     HDL Cholesterol: 57 mg/dL     Total Cholesterol: 238 mg/dL  Course: Not optimized. Target levels for LDL are: < 100. Plan: Dietary changes: Increase soluble fiber. Decrease simple carbohydrates. Exercise changes: An average 40 minutes of moderate to vigorous-intensity aerobic activity 3 or 4 times per week. Lipid-lowering medications: Crestor (previously a few times per week, not daily since  results).   4. 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 Henrene Pastor. Stable, on medications. Will continue to monitor symptoms as they relate to his weight loss journey.  5. At risk for heart disease David Newman was given approximately 15 minutes of coronary artery disease prevention counseling today. He is 59 y.o. male and has risk factors for heart disease including obesity and HLD. We again discussed intensive lifestyle modifications today with an emphasis on specific weight loss instructions and strategies. We discussed Cardiac CT options. Will order. (See mychart message).  6. Class 1 obesity with serious comorbidity and body mass index (BMI) of 31.0 to 31.9 in adult, unspecified obesity type David Newman is  currently in the action stage of change. As such, his goal is to continue with weight loss efforts. He has agreed to keeping a food journal and adhering to recommended goals of 1800-2200 calories and 135 grams of protein.   Exercise goals: For substantial health benefits, adults should do at least 150 minutes (2 hours and 30 minutes) a week of moderate-intensity, or 75 minutes (1 hour and 15 minutes) a week of vigorous-intensity aerobic physical activity, or an equivalent combination of moderate- and vigorous-intensity aerobic activity. Aerobic activity should be performed in episodes of at least 10 minutes, and preferably, it should be spread throughout the week.  Behavioral modification strategies: increasing lean protein intake, decreasing liquid calories and increasing high fiber foods.  David Newman has agreed to follow-up with our clinic in 2-3 weeks. He was informed of the importance of frequent follow-up visits to maximize his success with intensive lifestyle modifications for his multiple health conditions.   Objective:   Blood pressure 116/75, pulse 62, temperature 98.1 F (36.7 C), temperature source Oral, height 5' 11"  (1.803 m), weight 227 lb (103 kg), SpO2 97 %. Body mass index is 31.66 kg/m.  General: Cooperative, alert, well developed, in no acute distress. HEENT: Conjunctivae and lids unremarkable. Cardiovascular: Regular rhythm.  Lungs: Normal work of breathing. Neurologic: No focal deficits.   Lab Results  Component Value Date   CREATININE 1.17 01/23/2020   BUN 23 01/23/2020   NA 140 01/23/2020   K 5.1 01/23/2020   CL 100 01/23/2020   CO2 28 01/23/2020   Lab Results  Component Value Date   ALT 23 01/23/2020   AST 22 01/23/2020   ALKPHOS 96 01/23/2020   BILITOT 0.5 01/23/2020   Lab Results  Component Value Date   HGBA1C 5.5 01/23/2020   Lab Results  Component Value Date   TSH 1.740 10/16/2019   Lab Results  Component Value Date   CHOL 238 (H) 01/23/2020    HDL 57 01/23/2020   LDLCALC 143 (H) 01/23/2020   TRIG 212 (H) 01/23/2020   CHOLHDL 4.2 01/23/2020   Lab Results  Component Value Date   WBC 7.0 01/23/2020   HGB 14.6 01/23/2020   HCT 43.8 01/23/2020   MCV 88 01/23/2020   PLT 269 01/23/2020   Attestation Statements:   Reviewed by clinician on day of visit: allergies, medications, problem list, medical history, surgical history, family history, social history, and previous encounter notes.  I, Water quality scientist, CMA, am acting as transcriptionist for Briscoe Deutscher, DO  I have reviewed the above documentation for accuracy and completeness, and I agree with the above. Briscoe Deutscher, DO

## 2020-02-08 ENCOUNTER — Other Ambulatory Visit (INDEPENDENT_AMBULATORY_CARE_PROVIDER_SITE_OTHER): Payer: Self-pay | Admitting: Family Medicine

## 2020-02-08 DIAGNOSIS — E559 Vitamin D deficiency, unspecified: Secondary | ICD-10-CM

## 2020-02-12 ENCOUNTER — Other Ambulatory Visit (INDEPENDENT_AMBULATORY_CARE_PROVIDER_SITE_OTHER): Payer: Self-pay | Admitting: Family Medicine

## 2020-02-12 MED ORDER — CYANOCOBALAMIN 1000 MCG/ML IJ SOLN: INTRAMUSCULAR | 15 refills | Status: DC

## 2020-02-12 MED ORDER — "SYRINGE 25G X 1"" 3 ML MISC": 0 refills | Status: DC

## 2020-02-12 MED ORDER — WEGOVY 0.25 MG/0.5ML ~~LOC~~ SOAJ: 0.2500 mg | SUBCUTANEOUS | 0 refills | Status: DC

## 2020-02-20 ENCOUNTER — Ambulatory Visit (INDEPENDENT_AMBULATORY_CARE_PROVIDER_SITE_OTHER): Payer: 59 | Admitting: Family Medicine

## 2020-02-20 ENCOUNTER — Other Ambulatory Visit: Payer: Self-pay

## 2020-02-20 ENCOUNTER — Encounter (INDEPENDENT_AMBULATORY_CARE_PROVIDER_SITE_OTHER): Payer: Self-pay | Admitting: Family Medicine

## 2020-02-20 ENCOUNTER — Other Ambulatory Visit (INDEPENDENT_AMBULATORY_CARE_PROVIDER_SITE_OTHER): Payer: Self-pay | Admitting: Family Medicine

## 2020-02-20 VITALS — BP 124/73 | HR 56 | Temp 98.1°F | Ht 71.0 in | Wt 225.0 lb

## 2020-02-20 DIAGNOSIS — E669 Obesity, unspecified: Secondary | ICD-10-CM | POA: Diagnosis not present

## 2020-02-20 DIAGNOSIS — E7849 Other hyperlipidemia: Secondary | ICD-10-CM

## 2020-02-20 DIAGNOSIS — E8881 Metabolic syndrome: Secondary | ICD-10-CM

## 2020-02-20 DIAGNOSIS — Z6831 Body mass index (BMI) 31.0-31.9, adult: Secondary | ICD-10-CM

## 2020-02-20 DIAGNOSIS — E538 Deficiency of other specified B group vitamins: Secondary | ICD-10-CM | POA: Diagnosis not present

## 2020-02-20 DIAGNOSIS — Z9189 Other specified personal risk factors, not elsewhere classified: Secondary | ICD-10-CM

## 2020-02-20 MED ORDER — OZEMPIC (0.25 OR 0.5 MG/DOSE) 2 MG/1.5ML ~~LOC~~ SOPN: 0.5000 mg | PEN_INJECTOR | SUBCUTANEOUS | 0 refills | Status: DC

## 2020-02-21 NOTE — Progress Notes (Signed)
Chief Complaint:   OBESITY David Newman is here to discuss his progress with his obesity treatment plan along with follow-up of his obesity related diagnoses. David Newman is on keeping a food journal and adhering to recommended goals of 1800-2200 calories and 135 grams of protein and states he is following his eating plan approximately 90-95% of the time. David Newman states he is doing cardio, strength training, and Total Body for 40-45 minutes 7 times per week.  Today's visit was #: 10 Starting weight: 249 lbs Starting date: 10/16/2019 Today's weight: 225 lbs Today's date: 02/20/2020 Total lbs lost to date: 24 lbs Total lbs lost since last in-office visit: 2 lbs Total weight loss percentage to date: -9.64%  Interim History: David Newman has continued to stay within his meal plan goals most of the time. He did go on a trip, but there was a lot of walking/movement involved. David Newman reports that it will cost $199 for a cardiac CT at [place].    Assessment/Plan:   1. Metabolic syndrome Starting goal: Lose 7-10% of starting weight. We discussed several lifestyle modifications today and he will continue to work on diet, exercise and weight loss efforts.  Elevated ASCVD.  Strong family history.  We have reviewed the risks and benefits of Ozempic. The patient denies a personal or family history of medullary thyroid cancer or MENII. The patient denies a history of pancreatitis. Alternative treatment options have been discussed. Patient understands that the use of Ozempic in a patient who does not have diabetes is considered off-label use. Patient has been advised of medications that are FDA-approved for obesity treatment. The potential risks and benefits of Ozempic were reviewed with the patient, and alternative treatment options were discussed. All questions were answered, and the patient wishes to move forward with this medication.  - Start Semaglutide,0.25 or 0.5MG/DOS, (OZEMPIC, 0.25 OR 0.5  MG/DOSE,) 2 MG/1.5ML SOPN; Inject 0.5 mg into the skin once a week.  Dispense: 1.5 mL; Refill: 0  2. B12 deficiency He will be starting B12 injections. We will continue to monitor symptoms as they relate to his weight loss journey.  Lab Results  Component Value Date   VITAMINB12 341 01/23/2020   3. Other hyperlipidemia Uncontrolled. He has been taking Crestor 5 mg daily since lab results released in mychart.  Plan:  Increase Crestor to 10 mg daily.  Patient is worried about myalgias.  Discussed Lipid Clinic.  Lab Results  Component Value Date   ALT 23 01/23/2020   AST 22 01/23/2020   ALKPHOS 96 01/23/2020   BILITOT 0.5 01/23/2020   Lab Results  Component Value Date   CHOL 238 (H) 01/23/2020   HDL 57 01/23/2020   LDLCALC 143 (H) 01/23/2020   TRIG 212 (H) 01/23/2020   CHOLHDL 4.2 01/23/2020   4. At risk for heart disease David Newman was given approximately 15 minutes of coronary artery disease prevention counseling today. He is 59 y.o. male and has risk factors for heart disease including obesity and metabolic syndrome. We again discussed intensive lifestyle modifications today with an emphasis on specific weight loss instructions and strategies.   5. Class 1 obesity with serious comorbidity and body mass index (BMI) of 31.0 to 31.9 in adult, unspecified obesity type  David Newman is currently in the action stage of change. As such, his goal is to continue with weight loss efforts. He has agreed to keeping a food journal and adhering to recommended goals of 1800-2200 calories and 135 grams of protein.   Exercise  goals: For substantial health benefits, adults should do at least 150 minutes (2 hours and 30 minutes) a week of moderate-intensity, or 75 minutes (1 hour and 15 minutes) a week of vigorous-intensity aerobic physical activity, or an equivalent combination of moderate- and vigorous-intensity aerobic activity. Aerobic activity should be performed in episodes of at least 10 minutes, and  preferably, it should be spread throughout the week.  Behavioral modification strategies: increasing lean protein intake, decreasing simple carbohydrates and increasing vegetables.  David Newman has agreed to follow-up with our clinic in 2-3 weeks. He was informed of the importance of frequent follow-up visits to maximize his success with intensive lifestyle modifications for his multiple health conditions.   Objective:   Blood pressure 124/73, pulse (!) 56, temperature 98.1 F (36.7 C), temperature source Oral, height 5' 11"  (1.803 m), weight 225 lb (102.1 kg), SpO2 97 %. Body mass index is 31.38 kg/m.  General: Cooperative, alert, well developed, in no acute distress. HEENT: Conjunctivae and lids unremarkable. Cardiovascular: Regular rhythm.  Lungs: Normal work of breathing. Neurologic: No focal deficits.   Lab Results  Component Value Date   CREATININE 1.17 01/23/2020   BUN 23 01/23/2020   NA 140 01/23/2020   K 5.1 01/23/2020   CL 100 01/23/2020   CO2 28 01/23/2020   Lab Results  Component Value Date   ALT 23 01/23/2020   AST 22 01/23/2020   ALKPHOS 96 01/23/2020   BILITOT 0.5 01/23/2020   Lab Results  Component Value Date   HGBA1C 5.5 01/23/2020   Lab Results  Component Value Date   TSH 1.740 10/16/2019   Lab Results  Component Value Date   CHOL 238 (H) 01/23/2020   HDL 57 01/23/2020   LDLCALC 143 (H) 01/23/2020   TRIG 212 (H) 01/23/2020   CHOLHDL 4.2 01/23/2020   Lab Results  Component Value Date   WBC 7.0 01/23/2020   HGB 14.6 01/23/2020   HCT 43.8 01/23/2020   MCV 88 01/23/2020   PLT 269 01/23/2020   Attestation Statements:   Reviewed by clinician on day of visit: allergies, medications, problem list, medical history, surgical history, family history, social history, and previous encounter notes.  I, Water quality scientist, CMA, am acting as transcriptionist for Briscoe Deutscher, DO  I have reviewed the above documentation for accuracy and completeness, and I  agree with the above. Briscoe Deutscher, DO

## 2020-02-27 ENCOUNTER — Ambulatory Visit
Admission: RE | Admit: 2020-02-27 | Discharge: 2020-02-27 | Disposition: A | Discharge status: Home / Self Care | Payer: No Typology Code available for payment source | Source: Ambulatory Visit | Attending: Family Medicine | Admitting: Family Medicine

## 2020-02-27 DIAGNOSIS — Z9189 Other specified personal risk factors, not elsewhere classified: Secondary | ICD-10-CM

## 2020-02-27 DIAGNOSIS — E7800 Pure hypercholesterolemia, unspecified: Secondary | ICD-10-CM | POA: Diagnosis not present

## 2020-03-04 ENCOUNTER — Encounter (INDEPENDENT_AMBULATORY_CARE_PROVIDER_SITE_OTHER): Payer: Self-pay | Admitting: Family Medicine

## 2020-03-04 ENCOUNTER — Other Ambulatory Visit: Payer: Self-pay

## 2020-03-04 ENCOUNTER — Ambulatory Visit (INDEPENDENT_AMBULATORY_CARE_PROVIDER_SITE_OTHER): Payer: 59 | Admitting: Family Medicine

## 2020-03-04 VITALS — BP 136/73 | HR 65 | Temp 98.3°F | Ht 71.0 in | Wt 226.0 lb

## 2020-03-04 DIAGNOSIS — E8881 Metabolic syndrome: Secondary | ICD-10-CM

## 2020-03-04 DIAGNOSIS — E538 Deficiency of other specified B group vitamins: Secondary | ICD-10-CM | POA: Diagnosis not present

## 2020-03-04 DIAGNOSIS — G4733 Obstructive sleep apnea (adult) (pediatric): Secondary | ICD-10-CM

## 2020-03-04 DIAGNOSIS — Z9989 Dependence on other enabling machines and devices: Secondary | ICD-10-CM

## 2020-03-04 DIAGNOSIS — K2289 Other specified disease of esophagus: Secondary | ICD-10-CM | POA: Diagnosis not present

## 2020-03-04 DIAGNOSIS — I700 Atherosclerosis of aorta: Secondary | ICD-10-CM | POA: Diagnosis not present

## 2020-03-04 DIAGNOSIS — E65 Localized adiposity: Secondary | ICD-10-CM | POA: Diagnosis not present

## 2020-03-04 DIAGNOSIS — R7303 Prediabetes: Secondary | ICD-10-CM

## 2020-03-04 DIAGNOSIS — Z6831 Body mass index (BMI) 31.0-31.9, adult: Secondary | ICD-10-CM

## 2020-03-04 DIAGNOSIS — R931 Abnormal findings on diagnostic imaging of heart and coronary circulation: Secondary | ICD-10-CM | POA: Diagnosis not present

## 2020-03-04 DIAGNOSIS — Z9189 Other specified personal risk factors, not elsewhere classified: Secondary | ICD-10-CM

## 2020-03-04 DIAGNOSIS — E782 Mixed hyperlipidemia: Secondary | ICD-10-CM | POA: Diagnosis not present

## 2020-03-04 DIAGNOSIS — E669 Obesity, unspecified: Secondary | ICD-10-CM

## 2020-03-04 NOTE — Progress Notes (Signed)
Chief Complaint:   OBESITY David Newman is here to discuss his progress with his obesity treatment plan along with follow-up of his obesity related diagnoses.   Today's visit was #: 11 Starting weight: 249 lbs Starting date: 10/16/2019 Today's weight: 226 lbs Today's date: 03/05/2020 Total lbs lost to date: 23 lbs Body mass index is 31.52 kg/m.  Total weight loss percentage to date: -9.24%  Interim History:  We reviewed his cardiac CT results.  He brought his B12 and Ozempic in to start/demonstrate.  He had a guys weekend and visit from his brother since our last visit.    Bioimpedance changes:  Fat mass:  72.6 - 69.2.  Muscle mass:  145.2 - 149.8.  Visceral fat rating 17 - 16.  Nutrition Plan: Journal 1800-2200 calories and 135 grams of protein daily 90-95% of the time. Anti-obesity medications: Ozempic. Reported side effects: Staring today. Hunger is moderately controlled controlled. Cravings are moderately controlled controlled.  Activity: [employer]/biking/back exercises for 30+ minutes 7 times per week. Sleep: Sleep is restful.  Stress: Work.  Assessment/Plan:   1. Metabolic syndrome Starting goal: Lose 7-10% of starting weight. He will continue to focus on protein-rich, low simple carbohydrate foods. We reviewed the importance of hydration, regular exercise for stress reduction, and restorative sleep.  He took his first dose of Ozempic in the office today.  Plan:  We will continue to check lab work every 3 months, with 10% weight loss, or should any other concerns arise.  -Start ondansetron (ZOFRAN ODT) 4 MG disintegrating tablet; Take 1 tablet (4 mg total) by mouth every 6 (six) hours as needed.  Dispense: 20 tablet; Refill: 0  2. B12 deficiency B12 injection was demonstrated by CMA in office today. We will continue to monitor symptoms as they relate to his weight loss journey.  Lab Results  Component Value Date   VITAMINB12 341 01/23/2020   3. Aortic atherosclerosis  ([place]), Cardiac CT 02/27/20  IMPRESSION: 1. Total Agatston score of 336, corresponding to 89th percentile for age, sex, and race based cohort. 2. Distal esophageal wall thickening, suspicious for esophagitis. 3. Aortic Atherosclerosis (ICD10-I70.0).  Plan:  Referral to Cardiology placed today for further risk stratification and treatment.  4. Abnormal screening cardiac CT, 02/27/20, score 336, 89th% Will refer David Newman to Cardiology today.  - Ambulatory referral to Cardiology  5. Esophageal thickening, Cardiac CT 02/27/20 We will continue to monitor symptoms as they relate to his weight loss journey. He will continue to focus on protein-rich, low simple carbohydrate foods. We reviewed the importance of hydration, regular exercise for stress reduction, and restorative sleep.   6. Mixed hyperlipidemia Course: Not at goal. Plan: Dietary changes: Increase soluble fiber. Decrease simple carbohydrates. Exercise changes: An average 40 minutes of moderate to vigorous-intensity aerobic activity 3 or 4 times per week. Lipid-lowering medications: Crestor.  Lab Results  Component Value Date   CHOL 238 (H) 01/23/2020   HDL 57 01/23/2020   LDLCALC 143 (H) 01/23/2020   TRIG 212 (H) 01/23/2020   CHOLHDL 4.2 01/23/2020   Lab Results  Component Value Date   ALT 23 01/23/2020   AST 22 01/23/2020   ALKPHOS 96 01/23/2020   BILITOT 0.5 01/23/2020   The 10-year ASCVD risk score Mikey Bussing DC Jr., et al., 2013) is: 8.7%   Values used to calculate the score:     Age: 59 years     Sex: Male     Is Non-Hispanic African American: No     Diabetic:  No     Tobacco smoker: No     Systolic Blood Pressure: 211 mmHg     Is BP treated: No     HDL Cholesterol: 57 mg/dL     Total Cholesterol: 238 mg/dL  7. Prediabetes Goal is HgbA1c < 5.7 and insulin level closer to 5. The current medical regimen is effective;  continue present plan and medications.  Lab Results  Component Value Date   HGBA1C 5.5 01/23/2020     8. Visceral obesity Current visceral fat rating: 16. Visceral adipose tissue is a hormonally active component of total body fat. This body composition phenotype is associated with medical disorders such as metabolic syndrome, cardiovascular disease and several malignancies including prostate, breast, and colorectal cancers. Starting goal: Lose 7-10% of starting weight. Visceral fat rating should be < 13.  9. OSA on CPAP OSA is a cause of systemic hypertension and is associated with an increased incidence of stroke, heart failure, atrial fibrillation, and coronary heart disease. Severe OSA increases all-cause mortality and cardiovascular mortality.   Goal: Treatment of OSA via CPAP compliance and weight loss. . Plasma ghrelin levels (appetite or "hunger hormone") are significantly higher in OSA patients than in BMI-matched controls, but decrease to levels similar to those of obese patients without OSA after CPAP treatment.  . Weight loss improves OSA by several mechanisms, including reduction in fatty tissue in the throat (i.e. parapharyngeal fat) and the tongue. Loss of abdominal fat increases mediastinal traction on the upper airway making it less likely to collapse during sleep. . Studies have also shown that compliance with CPAP treatment improves leptin (hunger inhibitory hormone) imbalance.  10. At risk for heart disease David Newman was given approximately 15 minutes of coronary artery disease prevention counseling today. He is 59 y.o. male and has risk factors for heart disease including obesity and now with new Cardiac CT evidence of increased risk of cardiac event. We discussed intensive lifestyle modifications today with an emphasis on specific weight loss instructions and strategies. Referring to Cardiology.  11. Class 1 obesity with serious comorbidity and body mass index (BMI) of 31.0 to 31.9 in adult, unspecified obesity type  David Newman is currently in the action stage of change. As  such, his goal is to continue with weight loss efforts.   Nutrition goals: He has agreed to keeping a food journal and adhering to recommended goals of 1800-2200 calories and 135 grams of protein.   Exercise goals: For substantial health benefits, adults should do at least 150 minutes (2 hours and 30 minutes) a week of moderate-intensity, or 75 minutes (1 hour and 15 minutes) a week of vigorous-intensity aerobic physical activity, or an equivalent combination of moderate- and vigorous-intensity aerobic activity. Aerobic activity should be performed in episodes of at least 10 minutes, and preferably, it should be spread throughout the week.  Behavioral modification strategies: increasing lean protein intake, decreasing simple carbohydrates, increasing vegetables, increasing water intake, decreasing liquid calories and decreasing alcohol intake.  David Newman has agreed to follow-up with our clinic in 2-3 weeks. He was informed of the importance of frequent follow-up visits to maximize his success with intensive lifestyle modifications for his multiple health conditions.   Objective:   Blood pressure 136/73, pulse 65, temperature 98.3 F (36.8 C), temperature source Oral, height 5' 11"  (1.803 m), weight 226 lb (102.5 kg), SpO2 98 %. Body mass index is 31.52 kg/m.  General: Cooperative, alert, well developed, in no acute distress. HEENT: Conjunctivae and lids unremarkable. Cardiovascular: Regular rhythm.  Lungs: Normal work of breathing. Neurologic: No focal deficits.   Lab Results  Component Value Date   CREATININE 1.17 01/23/2020   BUN 23 01/23/2020   NA 140 01/23/2020   K 5.1 01/23/2020   CL 100 01/23/2020   CO2 28 01/23/2020   Lab Results  Component Value Date   ALT 23 01/23/2020   AST 22 01/23/2020   ALKPHOS 96 01/23/2020   BILITOT 0.5 01/23/2020   Lab Results  Component Value Date   HGBA1C 5.5 01/23/2020   Lab Results  Component Value Date   TSH 1.740 10/16/2019   Lab  Results  Component Value Date   CHOL 238 (H) 01/23/2020   HDL 57 01/23/2020   LDLCALC 143 (H) 01/23/2020   TRIG 212 (H) 01/23/2020   CHOLHDL 4.2 01/23/2020   Lab Results  Component Value Date   WBC 7.0 01/23/2020   HGB 14.6 01/23/2020   HCT 43.8 01/23/2020   MCV 88 01/23/2020   PLT 269 01/23/2020   Attestation Statements:   Reviewed by clinician on day of visit: allergies, medications, problem list, medical history, surgical history, family history, social history, and previous encounter notes.  I, Water quality scientist, CMA, am acting as transcriptionist for Briscoe Deutscher, DO  I have reviewed the above documentation for accuracy and completeness, and I agree with the above. Briscoe Deutscher, DO

## 2020-03-06 ENCOUNTER — Other Ambulatory Visit (INDEPENDENT_AMBULATORY_CARE_PROVIDER_SITE_OTHER): Payer: Self-pay | Admitting: Family Medicine

## 2020-03-06 MED ORDER — ONDANSETRON 4 MG PO TBDP: 4.0000 mg | ORAL_TABLET | Freq: Four times a day (QID) | ORAL | 0 refills | Status: DC | PRN

## 2020-03-12 ENCOUNTER — Other Ambulatory Visit (INDEPENDENT_AMBULATORY_CARE_PROVIDER_SITE_OTHER): Payer: Self-pay | Admitting: Family Medicine

## 2020-03-12 ENCOUNTER — Encounter (INDEPENDENT_AMBULATORY_CARE_PROVIDER_SITE_OTHER): Payer: Self-pay | Admitting: Family Medicine

## 2020-03-12 DIAGNOSIS — E559 Vitamin D deficiency, unspecified: Secondary | ICD-10-CM

## 2020-03-12 NOTE — Telephone Encounter (Signed)
Last seen by Dr. Wallace. 

## 2020-03-14 ENCOUNTER — Ambulatory Visit: Payer: 59 | Attending: Internal Medicine

## 2020-03-14 DIAGNOSIS — Z23 Encounter for immunization: Secondary | ICD-10-CM

## 2020-03-14 NOTE — Progress Notes (Signed)
   Covid-19 Vaccination Clinic  Name:  David Newman    MRN: [id number] DOB: 10/23/1960  03/14/2020  Mr. David Newman was observed post Covid-19 immunization for 15 minutes without incident. He was provided with Vaccine Information Sheet and instruction to access the V-Safe system.   Mr. David Newman was instructed to call 911 with any severe reactions post vaccine: Marland Kitchen Difficulty breathing  . Swelling of face and throat  . A fast heartbeat  . A bad rash all over body  . Dizziness and weakness

## 2020-03-18 ENCOUNTER — Ambulatory Visit (INDEPENDENT_AMBULATORY_CARE_PROVIDER_SITE_OTHER): Payer: 59 | Admitting: Family Medicine

## 2020-03-18 ENCOUNTER — Encounter (INDEPENDENT_AMBULATORY_CARE_PROVIDER_SITE_OTHER): Payer: Self-pay | Admitting: Family Medicine

## 2020-03-18 ENCOUNTER — Other Ambulatory Visit: Payer: Self-pay

## 2020-03-18 VITALS — BP 127/73 | HR 63 | Temp 98.9°F | Ht 71.0 in | Wt 223.0 lb

## 2020-03-18 DIAGNOSIS — R632 Polyphagia: Secondary | ICD-10-CM | POA: Diagnosis not present

## 2020-03-18 DIAGNOSIS — E8881 Metabolic syndrome: Secondary | ICD-10-CM

## 2020-03-18 DIAGNOSIS — E669 Obesity, unspecified: Secondary | ICD-10-CM | POA: Diagnosis not present

## 2020-03-18 DIAGNOSIS — Z6831 Body mass index (BMI) 31.0-31.9, adult: Secondary | ICD-10-CM

## 2020-03-18 DIAGNOSIS — E782 Mixed hyperlipidemia: Secondary | ICD-10-CM

## 2020-03-18 DIAGNOSIS — E538 Deficiency of other specified B group vitamins: Secondary | ICD-10-CM | POA: Diagnosis not present

## 2020-03-18 DIAGNOSIS — Z9189 Other specified personal risk factors, not elsewhere classified: Secondary | ICD-10-CM | POA: Diagnosis not present

## 2020-03-18 NOTE — Progress Notes (Signed)
Chief Complaint:   OBESITY David Newman is here to discuss his progress with his obesity treatment plan along with follow-up of his obesity related diagnoses.   Today's visit was #: 12 Starting weight: 249 lbs Starting date: 10/16/2019 Today's weight: 226 lbs Today's date: 03/18/2020 Total lbs lost to date: 23 lbs Body mass index is 31.1 kg/m.  Total weight loss percentage to date: -9.24%  Interim History: David Newman says that since his last visit, he had a "fat boy trout trip".  He is taking Ozempic with no nausea.  Energy level is lower.  He is scheduled for his CPE with Dr. Deatra Ina on 04/02/2020.  He will see Dr. Debara Pickett this week.  Nutrition Plan: keeping a food journal and adhering to recommended goals of 1800-2200 calories and 135 grams of protein for 90-95% of the time. Anti-obesity medications: Ozempic 0.5 mg subcutaneously weekly. Reported side effects: Nausea.  Assessment/Plan:   1. Mixed hyperlipidemia Plan: Dietary changes: Increase soluble fiber. Decrease simple carbohydrates. Exercise changes: An average 40 minutes of moderate to vigorous-intensity aerobic activity 3 or 4 times per week. Lipid-lowering medications: Crestor 5 mg daily.   Lab Results  Component Value Date   CHOL 238 (H) 01/23/2020   HDL 57 01/23/2020   LDLCALC 143 (H) 01/23/2020   TRIG 212 (H) 01/23/2020   CHOLHDL 4.2 01/23/2020   Lab Results  Component Value Date   ALT 23 01/23/2020   AST 22 01/23/2020   ALKPHOS 96 01/23/2020   BILITOT 0.5 01/23/2020   The 10-year ASCVD risk score Mikey Bussing DC Jr., et al., 2013) is: 7.7%   Values used to calculate the score:     Age: 16 years     Sex: Male     Is Non-Hispanic African American: No     Diabetic: No     Tobacco smoker: No     Systolic Blood Pressure: 034 mmHg     Is BP treated: No     HDL Cholesterol: 57 mg/dL     Total Cholesterol: 238 mg/dL  2. Metabolic syndrome Starting goal: Lose 7-10% of starting weight. He will continue to focus on  protein-rich, low simple carbohydrate foods. We reviewed the importance of hydration, regular exercise for stress reduction, and restorative sleep.  We will continue to check lab work every 3 months, with 10% weight loss, or should any other concerns arise.  Visceral fat rating is 16.  3. B12 deficiency We will continue to monitor. He is getting vitamin B12 injections of 1000 mcg IM monthly.  Lab Results  Component Value Date   VITAMINB12 341 01/23/2020   4. Polyphagia He will continue to focus on protein-rich, low simple carbohydrate foods. We reviewed the importance of hydration, regular exercise for stress reduction, and restorative sleep.  5. At risk for nausea ROXY FILLER was given approximately 15 minutes of nausea prevention counseling today. David Newman is at risk for nausea due to taking Ozempic. He was encouraged to titrate his medication slowly, make sure to stay hydrated, eat smaller portions throughout the day, and avoid high fat meals.   6. Class 1 obesity with serious comorbidity and body mass index (BMI) of 31.0 to 31.9 in adult, unspecified obesity type  David Newman is currently in the action stage of change. As such, his goal is to continue with weight loss efforts.   Nutrition goals: He has agreed to keeping a food journal and adhering to recommended goals of 1800-2200 calories and 135 grams of protein.  Exercise goals: For substantial health benefits, adults should do at least 150 minutes (2 hours and 30 minutes) a week of moderate-intensity, or 75 minutes (1 hour and 15 minutes) a week of vigorous-intensity aerobic physical activity, or an equivalent combination of moderate- and vigorous-intensity aerobic activity. Aerobic activity should be performed in episodes of at least 10 minutes, and preferably, it should be spread throughout the week.  Behavioral modification strategies: increasing lean protein intake, decreasing simple carbohydrates, increasing vegetables,  increasing water intake and decreasing liquid calories.  David Newman has agreed to follow-up with our clinic in 3 weeks. He was informed of the importance of frequent follow-up visits to maximize his success with intensive lifestyle modifications for his multiple health conditions.   Objective:   Blood pressure 127/73, pulse 63, temperature 98.9 F (37.2 C), temperature source Oral, height 5' 11"  (1.803 m), weight 223 lb (101.2 kg), SpO2 97 %. Body mass index is 31.1 kg/m.  General: Cooperative, alert, well developed, in no acute distress. HEENT: Conjunctivae and lids unremarkable. Cardiovascular: Regular rhythm.  Lungs: Normal work of breathing. Neurologic: No focal deficits.   Lab Results  Component Value Date   CREATININE 1.17 01/23/2020   BUN 23 01/23/2020   NA 140 01/23/2020   K 5.1 01/23/2020   CL 100 01/23/2020   CO2 28 01/23/2020   Lab Results  Component Value Date   ALT 23 01/23/2020   AST 22 01/23/2020   ALKPHOS 96 01/23/2020   BILITOT 0.5 01/23/2020   Lab Results  Component Value Date   HGBA1C 5.5 01/23/2020   Lab Results  Component Value Date   TSH 1.740 10/16/2019   Lab Results  Component Value Date   CHOL 238 (H) 01/23/2020   HDL 57 01/23/2020   LDLCALC 143 (H) 01/23/2020   TRIG 212 (H) 01/23/2020   CHOLHDL 4.2 01/23/2020   Lab Results  Component Value Date   WBC 7.0 01/23/2020   HGB 14.6 01/23/2020   HCT 43.8 01/23/2020   MCV 88 01/23/2020   PLT 269 01/23/2020   Attestation Statements:   Reviewed by clinician on day of visit: allergies, medications, problem list, medical history, surgical history, family history, social history, and previous encounter notes.  I, Water quality scientist, CMA, am acting as transcriptionist for Briscoe Deutscher, DO  I have reviewed the above documentation for accuracy and completeness, and I agree with the above. Briscoe Deutscher, DO

## 2020-03-19 ENCOUNTER — Other Ambulatory Visit: Payer: Self-pay | Admitting: Internal Medicine

## 2020-03-19 DIAGNOSIS — K51311 Ulcerative (chronic) rectosigmoiditis with rectal bleeding: Secondary | ICD-10-CM

## 2020-03-20 ENCOUNTER — Encounter: Payer: Self-pay | Admitting: Internal Medicine

## 2020-03-20 ENCOUNTER — Ambulatory Visit: Payer: 59 | Admitting: Internal Medicine

## 2020-03-20 ENCOUNTER — Other Ambulatory Visit: Payer: Self-pay

## 2020-03-20 VITALS — BP 128/84 | HR 64 | Ht 71.0 in | Wt 229.4 lb

## 2020-03-20 DIAGNOSIS — Z9989 Dependence on other enabling machines and devices: Secondary | ICD-10-CM | POA: Diagnosis not present

## 2020-03-20 DIAGNOSIS — E785 Hyperlipidemia, unspecified: Secondary | ICD-10-CM

## 2020-03-20 DIAGNOSIS — R931 Abnormal findings on diagnostic imaging of heart and coronary circulation: Secondary | ICD-10-CM

## 2020-03-20 DIAGNOSIS — Z8249 Family history of ischemic heart disease and other diseases of the circulatory system: Secondary | ICD-10-CM

## 2020-03-20 DIAGNOSIS — G4733 Obstructive sleep apnea (adult) (pediatric): Secondary | ICD-10-CM

## 2020-03-20 DIAGNOSIS — I10 Essential (primary) hypertension: Secondary | ICD-10-CM | POA: Diagnosis not present

## 2020-03-20 DIAGNOSIS — I2510 Atherosclerotic heart disease of native coronary artery without angina pectoris: Secondary | ICD-10-CM | POA: Diagnosis not present

## 2020-03-20 NOTE — Patient Instructions (Signed)
Medication Instructions:  Your physician recommends that you continue on your current medications as directed. Please refer to the Current Medication list given to you today.  *If you need a refill on your cardiac medications before your next appointment, please call your pharmacy*   Lab Work: FASTING lipid panel & LP(a) in about 2-3 months  If you have labs (blood work) drawn today and your tests are completely normal, you will receive your results only by: Marland Kitchen MyChart Message (if you have MyChart) OR . A paper copy in the mail If you have any lab test that is abnormal or we need to change your treatment, we will call you to review the results.   Testing/Procedures: NONE   Follow-Up: At [facility], you and your health needs are our priority.  As part of our continuing mission to provide you with exceptional heart care, we have created designated Provider Care Teams.  These Care Teams include your primary Cardiologist (physician) and Advanced Practice Providers (APPs -  Physician Assistants and Nurse Practitioners) who all work together to provide you with the care you need, when you need it.  We recommend signing up for the patient portal called "MyChart".  Sign up information is provided on this After Visit Summary.  MyChart is used to connect with patients for Virtual Visits (Telemedicine).  Patients are able to view lab/test results, encounter notes, upcoming appointments, etc.  Non-urgent messages can be sent to your provider as well.   To learn more about what you can do with MyChart, go to [email or web address].    Your next appointment:   3 month(s)  The format for your next appointment:   In Person  Provider:   Dr. Lyman Bishop   Other Instructions

## 2020-03-22 ENCOUNTER — Encounter: Payer: Self-pay | Admitting: Internal Medicine

## 2020-03-22 NOTE — Progress Notes (Signed)
OFFICE CONSULT NOTE  Chief Complaint:  Dyslipidemia, abnormal cardiac CT  Primary Care Physician: Aletha Halim., PA-C  HPI:  David Newman is a 59 y.o. male who is being seen today for the evaluation of dyslipidemia and abnormal cardiac CT at the request of David Deutscher, DO.  This is a pleasant 59 year old male who is enrolled in the weight management center.  She was referred by Dr.Wallace for risk factor modification given an abnormal cardiac CT.  A CT coronary calcium score was performed on February 27, 2020 demonstrating in total calcium score of 336, 89th percentile for age and sex matched control.  There was some distal esophageal thickening and aortic atherosclerosis.  Mr. David Newman indicates a family history of heart disease including his brother who died at age 67 of heart disease in father who had four-vessel CABG.  He had a recent lipid profile which showed total cholesterol of 238, triglycerides 212, HDL 57 and LDL of 143.  He was then started on rosuvastatin 10 mg daily.  He is also on low-dose aspirin but no other cardiac medications.  He has a history of ulcerative colitis as well as hypertension, obstructive sleep apnea on CPAP and prediabetes.  He has recently successfully lost weight.  He reports he is made significant dietary changes and try to lower saturated fat intake.  He is a former smoker but quit 12 years ago, but had a 35-pack-year smoking history.  PMHx:  Past Medical History:  Diagnosis Date  . Anal fissure   . At risk for sleep apnea    STOP-BANG= 5    SENT TO PCP 03-27-2014  . Back pain   . CAD (coronary artery disease)   . Colon polyps    hyperplastic  . DDD (degenerative disc disease), lumbar   . GERD (gastroesophageal reflux disease)   . Gout   . High blood pressure   . Hyperlipidemia   . Joint pain   . Knee pain   . OA (osteoarthritis) of knee   . Prediabetes   . Right knee meniscal tear   . Sleep apnea    Wears CPAP.  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 Wears contact lenses   . Wears glasses     Past Surgical History:  Procedure Laterality Date  . COLONOSCOPY  04-24-2012  . FACIAL RECONSTRUCTION SURGERY  1973  age 13  . KNEE ARTHROSCOPY Right 03/29/2014   Procedure: RIGHT ARTHROSCOPY KNEE WITH DEBRIDMENT PARTIAL medial lateral MENISCECTOMY AND CHONDROPLASTY;  Surgeon: Sydnee Cabal, MD;  Location: [facility];  Service: Orthopedics;  Laterality: Right;  . KNEE ARTHROSCOPY WITH ANTERIOR CRUCIATE LIGAMENT (ACL) REPAIR Right 1994  . KNEE ARTHROSCOPY WITH MEDIAL MENISECTOMY Left 12/28/2018   Procedure: KNEE ARTHROSCOPY WITH partial MEDIAL MENISECTOMY, debridement chondroplasty;  Surgeon: Sydnee Cabal, MD;  Location: [facility];  Service: Orthopedics;  Laterality: Left;  with knee block  . WISDOM TOOTH EXTRACTION  age 68    FAMHx:  Family History  Problem Relation Age of Onset  . Colon cancer Paternal Uncle 52  . Colon cancer Cousin 41  . Colon polyps Brother 49  . Heart disease Father   . Skin cancer Father   . Hypertension Father   . Hyperlipidemia Father   . Stroke Father   . Heart disease Brother   . Alzheimer's disease Mother   . Stomach cancer Neg Hx     SOCHx:   reports that he quit smoking about 12 years  ago. His smoking use included cigarettes. He has a 35.00 pack-year smoking history. He has never used smokeless tobacco. He reports current alcohol use. He reports that he does not use drugs.  ALLERGIES:  Allergies  Allergen Reactions  . Sulfa Antibiotics Nausea And Vomiting    ROS: Pertinent items noted in HPI and remainder of comprehensive ROS otherwise negative.  HOME MEDS: Current Outpatient Medications on File Prior to Visit  Medication Sig Dispense Refill  . aspirin 81 MG chewable tablet Chew 81 mg by mouth.    . calcium carbonate (OS-CAL) 1250 (500 Ca) MG chewable tablet Chew by mouth.    . calcium carbonate (TUMS - DOSED IN MG ELEMENTAL  CALCIUM) 500 MG chewable tablet Chew 1 tablet by mouth as needed for indigestion or heartburn. Reported on 05/15/2015    . cyanocobalamin (,VITAMIN B-12,) 1000 MCG/ML injection 1000 mcg (1 mg) injection once per week for four weeks, followed by 1000 mcg injection once per month. 1 mL 15  . LIALDA 1.2 g EC tablet TAKE 4 TABLETS (4.8 G TOTAL) BY MOUTH DAILY WITH BREAKFAST. 360 tablet 1  . LORazepam (ATIVAN) 1 MG tablet 1/2  - 1 1/2 po qhs prn    . melatonin 3 MG TABS tablet Take by mouth.    . mesalamine (ROWASA) 4 g enema INSERT 60 MLS RECTALLY AT BEDTIME. HOLD FOR 30 MINUTES IF POSSIBLE. 1680 mL 11  . Naftifine HCl 2 % CREA APPLY 1 APPLICATION TO THE FEET DAILY    . ondansetron (ZOFRAN ODT) 4 MG disintegrating tablet Take 1 tablet (4 mg total) by mouth every 6 (six) hours as needed. 20 tablet 0  . rosuvastatin (CRESTOR) 10 MG tablet Take 10 mg by mouth daily. One 10 mg tab daily    . Semaglutide,0.25 or 0.5MG/DOS, (OZEMPIC, 0.25 OR 0.5 MG/DOSE,) 2 MG/1.5ML SOPN Inject 0.5 mg into the skin once a week. 1.5 mL 0  . SUTAB 8882-800-349 MG TABS See admin instructions.    . Syringe/Needle, Disp, (SYRINGE 3CC/25GX1") 25G X 1" 3 ML MISC For B12 injections. 50 each 0  . Vitamin D, Ergocalciferol, (DRISDOL) 1.25 MG (50000 UNIT) CAPS capsule TAKE 1 CAPSULE (50,000 UNITS TOTAL) BY MOUTH EVERY 7 (SEVEN) DAYS. 4 capsule 0  . rosuvastatin (CRESTOR) 5 MG tablet Take 5 mg by mouth as needed. Reported on 06/13/2015     No current facility-administered medications on file prior to visit.    LABS/IMAGING: No results found for this or any previous visit (from the past 48 hour(s)). No results found.  LIPID PANEL:    Component Value Date/Time   CHOL 238 (H) 01/23/2020 0816   TRIG 212 (H) 01/23/2020 0816   HDL 57 01/23/2020 0816   CHOLHDL 4.2 01/23/2020 0816   LDLCALC 143 (H) 01/23/2020 0816    WEIGHTS: Wt Readings from Last 3 Encounters:  03/20/20 229 lb 6.4 oz (104.1 kg)  03/18/20 223 lb (101.2 kg)   03/04/20 226 lb (102.5 kg)    VITALS: BP 128/84   Pulse 64   Ht 5' 11"  (1.803 m)   Wt 229 lb 6.4 oz (104.1 kg)   SpO2 99%   BMI 31.99 kg/m   EXAM: General appearance: alert and no distress Neck: no carotid bruit, no JVD and thyroid not enlarged, symmetric, no tenderness/mass/nodules Lungs: clear to auscultation bilaterally Heart: regular rate and rhythm, S1, S2 normal, no murmur, click, rub or gallop Abdomen: soft, non-tender; bowel sounds normal; no masses,  no organomegaly Extremities: extremities normal, atraumatic,  no cyanosis or edema Pulses: 2+ and symmetric Skin: Skin color, texture, turgor normal. No rashes or lesions Neurologic: Grossly normal Psych: Pleasant  EKG: Normal sinus rhythm at 64-personally reviewed  ASSESSMENT: 1. 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 Aortic atherosclerosis 3. Family history of premature coronary disease 4. Hypertension 5. OSA on CPAP 6. Moderate obesity with weight loss efforts in place  PLAN: 1.   Mr. Spray has an elevated coronary artery calcium score but seems to be asymptomatic.  He exercises regularly and has recently been losing weight.  He has made significant dietary changes.  I applauded him on his efforts to modify his risk factors.  I think genetically his cholesterol still remains high and likely explains his family history of heart disease.  We will probably need to increase his rosuvastatin to 20 or 40 mg depending on his repeat lipid testing which could be done in 2 to 3 months since he is already been on therapy for a short period of time.  I would also add an LP(a) to evaluate for any increased genetic risk based on this.  Thanks again for the kind referral.  Pixie Casino, MD, FACC, [place] Director of the [facility]  [facility] Diplomate of the American Board of Clinical Lipidology Attending  Cardiologist  Direct Dial: [phone]  Fax: [phone]  Website:  [email or web address] 03/22/2020, 12:33 PM

## 2020-03-31 ENCOUNTER — Telehealth: Payer: Self-pay | Admitting: Internal Medicine

## 2020-03-31 ENCOUNTER — Other Ambulatory Visit: Payer: Self-pay | Admitting: Internal Medicine

## 2020-03-31 DIAGNOSIS — K51311 Ulcerative (chronic) rectosigmoiditis with rectal bleeding: Secondary | ICD-10-CM

## 2020-03-31 MED ORDER — MESALAMINE 4 G RE ENEM: ENEMA | RECTAL | 0 refills | Status: DC

## 2020-03-31 NOTE — Telephone Encounter (Signed)
Pt also states he is scheduled for a colon with Dr. Henrene Pastor 04/17/20.

## 2020-03-31 NOTE — Telephone Encounter (Signed)
1.  Confirm Lialda 4.8 g daily 2.  Increase mesalamine enemas twice daily until improvement.  Thereafter, may decrease to once daily.  It may take a week to notice improvement, so be patient.

## 2020-03-31 NOTE — Telephone Encounter (Signed)
Spoke with pt and he is aware. New script for enemas sent to pharmacy.

## 2020-03-31 NOTE — Telephone Encounter (Signed)
Pt states he started having a UC flare about Tues or Wed. On Sat he started the mesalamine enemas. He has seen some blood and mucous along with some urgency. Pt is requesting something be called in for the flare. Pt would prefer not to have prednisone if Dr. Henrene Pastor has any alternative. Reports he has been going to the healthy wt and wellness and has lost over 20 lbs and does not want to take the prednisone if there is something else. Please advise.

## 2020-04-01 ENCOUNTER — Other Ambulatory Visit: Payer: Self-pay

## 2020-04-01 ENCOUNTER — Encounter (INDEPENDENT_AMBULATORY_CARE_PROVIDER_SITE_OTHER): Payer: Self-pay | Admitting: Family Medicine

## 2020-04-01 ENCOUNTER — Ambulatory Visit (INDEPENDENT_AMBULATORY_CARE_PROVIDER_SITE_OTHER): Payer: 59 | Admitting: Family Medicine

## 2020-04-01 VITALS — BP 133/72 | HR 74 | Temp 98.6°F | Ht 71.0 in | Wt 223.0 lb

## 2020-04-01 DIAGNOSIS — K51918 Ulcerative colitis, unspecified with other complication: Secondary | ICD-10-CM | POA: Diagnosis not present

## 2020-04-01 DIAGNOSIS — I25119 Atherosclerotic heart disease of native coronary artery with unspecified angina pectoris: Secondary | ICD-10-CM

## 2020-04-01 DIAGNOSIS — N5319 Other ejaculatory dysfunction: Secondary | ICD-10-CM

## 2020-04-01 DIAGNOSIS — E669 Obesity, unspecified: Secondary | ICD-10-CM

## 2020-04-01 DIAGNOSIS — Z6831 Body mass index (BMI) 31.0-31.9, adult: Secondary | ICD-10-CM

## 2020-04-01 NOTE — Progress Notes (Signed)
Chief Complaint:   OBESITY David Newman is here to discuss his progress with his obesity treatment plan along with follow-up of his obesity related diagnoses.   Today's visit was #: 13 Starting weight: 249 lbs Starting date: 10/16/2019 Today's weight: 223 lbs Today's date: 04/01/2020 Total lbs lost to date: 26 lbs Body mass index is 31.1 kg/m.  Total weight loss percentage to date:  -10.44%  Nutrition Plan: keeping a food journal and adhering to recommended goals of 1800-2200 calories and 135 grams of protein for 95% of the time.  Anti-obesity medications: Ozempic.  Hunger is moderately controlled controlled. Cravings are moderately controlled controlled.  Activity: Rehab, bike, Bo-Flex for 60 minutes 6 times per week.  Assessment/Plan:   1. Anejaculation Recently, two episodes.  Associated with ETOH.  New medications:  Crestor, Ozempic, B12.  Medications that can contribute:  Alpha blockers, SSRIs.  He saw Dr. Diona Fanti at [facility] for history of benign hematuria.  Always normal PSA.  Possibly related to ulcerative colitis flare.  2. Ulcerative colitis with other complication, unspecified location flair [employer]) Will monitor.  He is scheduled for a colonoscopy on December 2.  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 Debara Pickett.  LDL goal 70.  Currently taking Crestor 10 mg at night.  May require Repatha.    Labs are due around February.    4. Class 1 obesity with serious comorbidity and body mass index (BMI) of 31.0 to 31.9 in adult, unspecified obesity type  Course: David Newman is currently in the action stage of change. As such, his goal is to continue with weight loss efforts.   Nutrition goals: He has agreed to keeping a food journal and adhering to recommended goals of 1800-2200 calories and 135 grams of protein.   Exercise goals: For substantial health benefits, adults should do at least 150 minutes (2 hours and 30 minutes) a week of moderate-intensity,  or 75 minutes (1 hour and 15 minutes) a week of vigorous-intensity aerobic physical activity, or an equivalent combination of moderate- and vigorous-intensity aerobic activity. Aerobic activity should be performed in episodes of at least 10 minutes, and preferably, it should be spread throughout the week.  Behavioral modification strategies: increasing lean protein intake, decreasing simple carbohydrates, increasing vegetables, increasing water intake and decreasing liquid calories.  David Newman has agreed to follow-up with our clinic in 3 weeks. He was informed of the importance of frequent follow-up visits to maximize his success with intensive lifestyle modifications for his multiple health conditions.   Objective:   Blood pressure 133/72, pulse 74, temperature 98.6 F (37 C), height 5' 11"  (1.803 m), weight 223 lb (101.2 kg), SpO2 97 %. Body mass index is 31.1 kg/m.  General: Cooperative, alert, well developed, in no acute distress. HEENT: Conjunctivae and lids unremarkable. Cardiovascular: Regular rhythm.  Lungs: Normal work of breathing. Neurologic: No focal deficits.   Lab Results  Component Value Date   CREATININE 1.17 01/23/2020   BUN 23 01/23/2020   NA 140 01/23/2020   K 5.1 01/23/2020   CL 100 01/23/2020   CO2 28 01/23/2020   Lab Results  Component Value Date   ALT 23 01/23/2020   AST 22 01/23/2020   ALKPHOS 96 01/23/2020   BILITOT 0.5 01/23/2020   Lab Results  Component Value Date   HGBA1C 5.5 01/23/2020   Lab Results  Component Value Date   TSH 1.740 10/16/2019   Lab Results  Component Value Date   CHOL 238 (H)  01/23/2020   HDL 57 01/23/2020   LDLCALC 143 (H) 01/23/2020   TRIG 212 (H) 01/23/2020   CHOLHDL 4.2 01/23/2020   Lab Results  Component Value Date   WBC 7.0 01/23/2020   HGB 14.6 01/23/2020   HCT 43.8 01/23/2020   MCV 88 01/23/2020   PLT 269 01/23/2020   Attestation Statements:   Reviewed by clinician on day of visit: allergies,  medications, problem list, medical history, surgical history, family history, social history, and previous encounter notes. Time, including pre and post visit: 39.   I, Amber Agner, CMA, am acting as transcriptionist for Briscoe Deutscher, DO  I have reviewed the above documentation for accuracy and completeness, and I agree with the above. Briscoe Deutscher, DO

## 2020-04-02 ENCOUNTER — Other Ambulatory Visit (HOSPITAL_COMMUNITY): Payer: Self-pay | Admitting: Family Medicine

## 2020-04-02 ENCOUNTER — Encounter (INDEPENDENT_AMBULATORY_CARE_PROVIDER_SITE_OTHER): Payer: Self-pay | Admitting: Family Medicine

## 2020-04-02 DIAGNOSIS — M1A9XX0 Chronic gout, unspecified, without tophus (tophi): Secondary | ICD-10-CM | POA: Diagnosis not present

## 2020-04-02 DIAGNOSIS — Z125 Encounter for screening for malignant neoplasm of prostate: Secondary | ICD-10-CM | POA: Diagnosis not present

## 2020-04-02 DIAGNOSIS — Z0000 Encounter for general adult medical examination without abnormal findings: Secondary | ICD-10-CM | POA: Diagnosis not present

## 2020-04-02 NOTE — Telephone Encounter (Signed)
Please review and advise. Darin Arndt, [place]

## 2020-04-03 NOTE — Telephone Encounter (Signed)
Please review

## 2020-04-09 ENCOUNTER — Encounter: Payer: Self-pay | Admitting: Internal Medicine

## 2020-04-16 ENCOUNTER — Ambulatory Visit (INDEPENDENT_AMBULATORY_CARE_PROVIDER_SITE_OTHER): Payer: 59 | Admitting: Family Medicine

## 2020-04-17 ENCOUNTER — Ambulatory Visit (AMBULATORY_SURGERY_CENTER): Payer: 59 | Admitting: Internal Medicine

## 2020-04-17 ENCOUNTER — Encounter: Payer: Self-pay | Admitting: Internal Medicine

## 2020-04-17 ENCOUNTER — Other Ambulatory Visit: Payer: Self-pay

## 2020-04-17 VITALS — BP 119/78 | HR 58 | Temp 97.3°F | Resp 12 | Ht 71.0 in | Wt 229.0 lb

## 2020-04-17 DIAGNOSIS — Z1211 Encounter for screening for malignant neoplasm of colon: Secondary | ICD-10-CM

## 2020-04-17 DIAGNOSIS — K5190 Ulcerative colitis, unspecified, without complications: Secondary | ICD-10-CM | POA: Diagnosis not present

## 2020-04-17 DIAGNOSIS — K51311 Ulcerative (chronic) rectosigmoiditis with rectal bleeding: Secondary | ICD-10-CM

## 2020-04-17 DIAGNOSIS — K529 Noninfective gastroenteritis and colitis, unspecified: Secondary | ICD-10-CM | POA: Diagnosis not present

## 2020-04-17 MED ORDER — SODIUM CHLORIDE 0.9 % IV SOLN: 500.0000 mL | Freq: Once | INTRAVENOUS | Status: DC

## 2020-04-17 NOTE — Progress Notes (Signed)
Called to room to assist during endoscopic procedure.  Patient ID and intended procedure confirmed with present staff. Received instructions for my participation in the procedure from the performing physician.  

## 2020-04-17 NOTE — Op Note (Signed)
[facility] Patient Name: David Newman Procedure Date: 04/17/2020 8:12 AM MRN: [id number] Endoscopist: Docia Chuck. Henrene Pastor , MD Age: 59 Referring MD:  Date of Birth: 05-Nov-1960 Gender: Male Account #: [id number] Procedure:                Colonoscopy with biopsies Indications:              Colon cancer screening. History of ulcerative                            proctosigmoiditis to 25 cm diagnosed December 2016.                            Recent flare. Medicines:                Monitored Anesthesia Care Procedure:                Pre-Anesthesia Assessment:                           - Prior to the procedure, a History and Physical                            was performed, and patient medications and                            allergies were reviewed. The patient's tolerance of                            previous anesthesia was also reviewed. The risks                            and benefits of the procedure and the sedation                            options and risks were discussed with the patient.                            All questions were answered, and informed consent                            was obtained. Prior Anticoagulants: The patient has                            taken no previous anticoagulant or antiplatelet                            agents. After reviewing the risks and benefits, the                            patient was deemed in satisfactory condition to                            undergo the procedure.  After obtaining informed consent, the colonoscope                            was passed under direct vision. Throughout the                            procedure, the patient's blood pressure, pulse, and                            oxygen saturations were monitored continuously. The                            Colonoscope was introduced through the anus and                            advanced to the the cecum, identified by                             appendiceal orifice and ileocecal valve. The                            terminal ileum, ileocecal valve, appendiceal                            orifice, and rectum were photographed. The quality                            of the bowel preparation was excellent. The                            colonoscopy was performed without difficulty. The                            patient tolerated the procedure well. The bowel                            preparation used was SUPREP via split dose                            instruction. Scope In: 8:22:18 AM Scope Out: 8:42:23 AM Scope Withdrawal Time: 0 hours 12 minutes 23 seconds  Total Procedure Duration: 0 hours 20 minutes 5 seconds  Findings:                 The terminal ileum appeared normal.                           There was mild proctitis involving the distal 5 cm                            of the rectum. The sigmoid colon, descending colon,                            transverse colon, descending colon were normal.  There was patchy colitis involving the cecum (see                            images). The entire examined colon appeared normal                            on direct and retroflexion views. Biopsies were                            taken with a cold forceps for histology from the                            cecum, transverse colon, and distal rectum. Complications:            No immediate complications. Estimated blood loss:                            None. Estimated Blood Loss:     Estimated blood loss: none. Impression:               - The examined portion of the ileum was normal.                           - The entire examined colon is normal on direct and                            retroflexion views. Recommendation:           - Repeat colonoscopy in 5 years for surveillance.                           - Patient has a contact number available for                            emergencies. The  signs and symptoms of potential                            delayed complications were discussed with the                            patient. Return to normal activities tomorrow.                            Written discharge instructions were provided to the                            patient.                           - Resume previous diet.                           - Continue present medications.                           - Await pathology results.                           -  Routine office follow-up with Dr. Henrene Pastor in 2 to 3                            months Docia Chuck. Henrene Pastor, MD 04/17/2020 9:10:13 AM This report has been signed electronically.

## 2020-04-17 NOTE — Patient Instructions (Signed)
Please read handouts provided. Continue present medications. Await pathology results. Follow-up with Dr. Henrene Pastor in 2-3 months.     YOU HAD AN ENDOSCOPIC PROCEDURE TODAY AT [facility]:   Refer to the procedure report that was given to you for any specific questions about what was found during the examination.  If the procedure report does not answer your questions, please call your gastroenterologist to clarify.  If you requested that your care partner not be given the details of your procedure findings, then the procedure report has been included in a sealed envelope for you to review at your convenience later.  YOU SHOULD EXPECT: Some feelings of bloating in the abdomen. Passage of more gas than usual.  Walking can help get rid of the air that was put into your GI tract during the procedure and reduce the bloating. If you had a lower endoscopy (such as a colonoscopy or flexible sigmoidoscopy) you may notice spotting of blood in your stool or on the toilet paper. If you underwent a bowel prep for your procedure, you may not have a normal bowel movement for a few days.  Please Note:  You might notice some irritation and congestion in your nose or some drainage.  This is from the oxygen used during your procedure.  There is no need for concern and it should clear up in a day or so.  SYMPTOMS TO REPORT IMMEDIATELY:   Following lower endoscopy (colonoscopy or flexible sigmoidoscopy):  Excessive amounts of blood in the stool  Significant tenderness or worsening of abdominal pains  Swelling of the abdomen that is new, acute  Fever of 100F or higher   For urgent or emergent issues, a gastroenterologist can be reached at any hour by calling [phone]. Do not use MyChart messaging for urgent concerns.    DIET:  We do recommend a small meal at first, but then you may proceed to your regular diet.  Drink plenty of fluids but you should avoid alcoholic beverages for 24  hours.  ACTIVITY:  You should plan to take it easy for the rest of today and you should NOT DRIVE or use heavy machinery until tomorrow (because of the sedation medicines used during the test).    FOLLOW UP: Our staff will call the number listed on your records 48-72 hours following your procedure to check on you and address any questions or concerns that you may have regarding the information given to you following your procedure. If we do not reach you, we will leave a message.  We will attempt to reach you two times.  During this call, we will ask if you have developed any symptoms of COVID 19. If you develop any symptoms (ie: fever, flu-like symptoms, shortness of breath, cough etc.) before then, please call [phone].  If you test positive for Covid 19 in the 2 weeks post procedure, please call and report this information to [place].    If any biopsies were taken you will be contacted by phone or by letter within the next 1-3 weeks.  Please call us at [phone] if you have not heard about the biopsies in 3 weeks.    SIGNATURES/CONFIDENTIALITY: You and/or your care partner have signed paperwork which will be entered into your electronic medical record.  These signatures attest to the fact that that the information above on your After Visit Summary has been reviewed and is understood.  Full responsibility of the confidentiality of this discharge information lies with you  and/or your care-partner.

## 2020-04-17 NOTE — Progress Notes (Signed)
A/ox3, pleased with MAC, report to RN 

## 2020-04-21 ENCOUNTER — Other Ambulatory Visit (INDEPENDENT_AMBULATORY_CARE_PROVIDER_SITE_OTHER): Payer: Self-pay | Admitting: Family Medicine

## 2020-04-21 ENCOUNTER — Telehealth: Payer: Self-pay | Admitting: *Deleted

## 2020-04-21 DIAGNOSIS — E559 Vitamin D deficiency, unspecified: Secondary | ICD-10-CM

## 2020-04-21 NOTE — Telephone Encounter (Signed)
  Follow up Call-  Call back number 04/17/2020  Post procedure Call Back phone  # [phone]  Permission to leave phone message Yes  Some recent data might be hidden     Patient questions:  Do you have a fever, pain , or abdominal swelling? No. Pain Score  0 *  Have you tolerated food without any problems? Yes.    Have you been able to return to your normal activities? Yes.    Do you have any questions about your discharge instructions: Diet   No. Medications  No. Follow up visit  No.  Do you have questions or concerns about your Care? No.  Actions: * If pain score is 4 or above: No action needed, pain <4  1. Have you developed a fever since your procedure? NO  2.   Have you had an respiratory symptoms (SOB or cough) since your procedure? NO  3.   Have you tested positive for COVID 19 since your procedure NO  4.   Have you had any family members/close contacts diagnosed with the COVID 19 since your procedure?  NO   If yes to any of these questions please route to Joylene John, RN and Joella Prince, RN

## 2020-04-21 NOTE — Telephone Encounter (Signed)
Dr.Wallace °

## 2020-04-22 ENCOUNTER — Encounter: Payer: Self-pay | Admitting: Internal Medicine

## 2020-04-23 ENCOUNTER — Other Ambulatory Visit: Payer: Self-pay

## 2020-04-23 ENCOUNTER — Other Ambulatory Visit (INDEPENDENT_AMBULATORY_CARE_PROVIDER_SITE_OTHER): Payer: Self-pay | Admitting: Family Medicine

## 2020-04-23 ENCOUNTER — Encounter (INDEPENDENT_AMBULATORY_CARE_PROVIDER_SITE_OTHER): Payer: Self-pay | Admitting: Family Medicine

## 2020-04-23 ENCOUNTER — Ambulatory Visit (INDEPENDENT_AMBULATORY_CARE_PROVIDER_SITE_OTHER): Payer: 59 | Admitting: Family Medicine

## 2020-04-23 VITALS — BP 127/79 | HR 73 | Temp 98.3°F | Ht 71.0 in | Wt 222.0 lb

## 2020-04-23 DIAGNOSIS — Z9189 Other specified personal risk factors, not elsewhere classified: Secondary | ICD-10-CM | POA: Diagnosis not present

## 2020-04-23 DIAGNOSIS — E559 Vitamin D deficiency, unspecified: Secondary | ICD-10-CM

## 2020-04-23 DIAGNOSIS — K51918 Ulcerative colitis, unspecified with other complication: Secondary | ICD-10-CM

## 2020-04-23 DIAGNOSIS — Z6831 Body mass index (BMI) 31.0-31.9, adult: Secondary | ICD-10-CM

## 2020-04-23 DIAGNOSIS — E669 Obesity, unspecified: Secondary | ICD-10-CM | POA: Diagnosis not present

## 2020-04-23 DIAGNOSIS — E8881 Metabolic syndrome: Secondary | ICD-10-CM

## 2020-04-23 DIAGNOSIS — E538 Deficiency of other specified B group vitamins: Secondary | ICD-10-CM | POA: Diagnosis not present

## 2020-04-23 MED ORDER — VITAMIN D (ERGOCALCIFEROL) 1.25 MG (50000 UNIT) PO CAPS: 50000.0000 [IU] | ORAL_CAPSULE | ORAL | 0 refills | Status: DC

## 2020-04-23 MED ORDER — INSULIN PEN NEEDLE 32G X 4 MM MISC: 0 refills | Status: DC

## 2020-04-23 MED ORDER — OZEMPIC (0.25 OR 0.5 MG/DOSE) 2 MG/1.5ML ~~LOC~~ SOPN: 0.2500 mg | PEN_INJECTOR | SUBCUTANEOUS | 0 refills | Status: DC

## 2020-04-23 NOTE — Progress Notes (Signed)
Chief Complaint:   OBESITY David Newman is here to discuss his progress with his obesity treatment plan along with follow-up of his obesity related diagnoses.   Today's visit was #: 14 Starting weight: 249 lbs Starting date: 10/16/2019 Today's weight: 222 lbs Today's date: 04/23/2020 Total lbs lost to date: 27 lbs Body mass index is 30.96 kg/m.  Total weight loss percentage to date: -10.84%  Interim History: David Newman says he will retry Ozempic. Anejaculation has resolved. Nutrition Plan: keeping a food journal and adhering to recommended goals of 1800-2200 calories and 135 grams of protein for 75% of the time  Anti-obesity medications: None.  Activity: Cardio and strength training for 45-60 minutes 3-4 times per week.  Assessment/Plan:   1. Metabolic syndrome Starting goal: Lose 7-10% of starting weight. He will continue to focus on protein-rich, low simple carbohydrate foods. We reviewed the importance of hydration, regular exercise for stress reduction, and restorative sleep.  We will continue to check lab work every 3 months, with 10% weight loss, or should any other concerns arise.  - Restart Semaglutide,0.25 or 0.5MG/DOS, (OZEMPIC, 0.25 OR 0.5 MG/DOSE,) 2 MG/1.5ML SOPN; Inject 0.25 mg into the skin once a week.  Dispense: 1.5 mL; Refill: 0 - Refill Insulin Pen Needle 32G X 4 MM MISC; Use weekly with Ozempic pen  Dispense: 50 each; Refill: 0  2. Vitamin D deficiency Improving, but not optimized. Current vitamin D is 56.7, tested on 01/23/2020.   Plan:  [x]   Continue Vitamin D @50 ,000 IU every week. []   Continue home supplement daily. [x]   Follow-up for routine testing of Vitamin D at least 2-3 times per year to avoid over-replacement.  - Refill Vitamin D, Ergocalciferol, (DRISDOL) 1.25 MG (50000 UNIT) CAPS capsule; Take 1 capsule (50,000 Units total) by mouth every 7 (seven) days.  Dispense: 4 capsule; Refill: 0  3. Ulcerative colitis with other complication ([place]) Still with  flare.  Followed by GI. We will continue to monitor symptoms as they relate to his weight loss journey.  4. B12 deficiency David Newman is getting vitamin B12 injections monthly.  Lab Results  Component Value Date   VITAMINB12 341 01/23/2020   5. At risk for constipation David Newman is at risk for constipation due to restarting Ozempic.  He was given approximately 15 minutes of counseling today regarding prevention of constipation. He was encouraged to increase water and fiber intake.   6. Class 1 obesity with serious comorbidity and body mass index (BMI) of 31.0 to 31.9 in adult, unspecified obesity type  Course: David Newman is currently in the action stage of change. As such, his goal is to continue with weight loss efforts.   Nutrition goals: He has agreed to keeping a food journal and adhering to recommended goals of 1800-2200 calories and 135 grams of protein.   Exercise goals: No exercise has been prescribed at this time.  Behavioral modification strategies: increasing lean protein intake, decreasing simple carbohydrates, increasing vegetables, increasing water intake, dealing with family or coworker sabotage, travel eating strategies, holiday eating strategies  and celebration eating strategies.  David Newman has agreed to follow-up with our clinic in 4 weeks. He was informed of the importance of frequent follow-up visits to maximize his success with intensive lifestyle modifications for his multiple health conditions.   Objective:   Blood pressure 127/79, pulse 73, temperature 98.3 F (36.8 C), temperature source Oral, height 5' 11"  (1.803 m), weight 222 lb (100.7 kg), SpO2 98 %. Body mass index is 30.96 kg/m.  General:  Cooperative, alert, well developed, in no acute distress. HEENT: Conjunctivae and lids unremarkable. Cardiovascular: Regular rhythm.  Lungs: Normal work of breathing. Neurologic: No focal deficits.   Lab Results  Component Value Date   CREATININE 1.17 01/23/2020    BUN 23 01/23/2020   NA 140 01/23/2020   K 5.1 01/23/2020   CL 100 01/23/2020   CO2 28 01/23/2020   Lab Results  Component Value Date   ALT 23 01/23/2020   AST 22 01/23/2020   ALKPHOS 96 01/23/2020   BILITOT 0.5 01/23/2020   Lab Results  Component Value Date   HGBA1C 5.5 01/23/2020   Lab Results  Component Value Date   TSH 1.740 10/16/2019   Lab Results  Component Value Date   CHOL 238 (H) 01/23/2020   HDL 57 01/23/2020   LDLCALC 143 (H) 01/23/2020   TRIG 212 (H) 01/23/2020   CHOLHDL 4.2 01/23/2020   Lab Results  Component Value Date   WBC 7.0 01/23/2020   HGB 14.6 01/23/2020   HCT 43.8 01/23/2020   MCV 88 01/23/2020   PLT 269 01/23/2020   Attestation Statements:   Reviewed by clinician on day of visit: allergies, medications, problem list, medical history, surgical history, family history, social history, and previous encounter notes.  I, Water quality scientist, CMA, am acting as transcriptionist for Briscoe Deutscher, DO  I have reviewed the above documentation for accuracy and completeness, and I agree with the above. Briscoe Deutscher, DO

## 2020-05-05 ENCOUNTER — Other Ambulatory Visit (HOSPITAL_COMMUNITY): Payer: Self-pay | Admitting: Dermatology

## 2020-05-05 ENCOUNTER — Ambulatory Visit (INDEPENDENT_AMBULATORY_CARE_PROVIDER_SITE_OTHER): Payer: 59 | Admitting: Family Medicine

## 2020-05-05 DIAGNOSIS — L400 Psoriasis vulgaris: Secondary | ICD-10-CM | POA: Diagnosis not present

## 2020-05-05 DIAGNOSIS — D2239 Melanocytic nevi of other parts of face: Secondary | ICD-10-CM | POA: Diagnosis not present

## 2020-05-05 DIAGNOSIS — D2261 Melanocytic nevi of right upper limb, including shoulder: Secondary | ICD-10-CM | POA: Diagnosis not present

## 2020-05-05 DIAGNOSIS — D2262 Melanocytic nevi of left upper limb, including shoulder: Secondary | ICD-10-CM | POA: Diagnosis not present

## 2020-05-05 DIAGNOSIS — B351 Tinea unguium: Secondary | ICD-10-CM | POA: Diagnosis not present

## 2020-05-05 DIAGNOSIS — D225 Melanocytic nevi of trunk: Secondary | ICD-10-CM | POA: Diagnosis not present

## 2020-05-26 ENCOUNTER — Other Ambulatory Visit: Payer: Self-pay

## 2020-05-26 ENCOUNTER — Encounter (INDEPENDENT_AMBULATORY_CARE_PROVIDER_SITE_OTHER): Payer: Self-pay | Admitting: Family Medicine

## 2020-05-26 ENCOUNTER — Ambulatory Visit (INDEPENDENT_AMBULATORY_CARE_PROVIDER_SITE_OTHER): Payer: 59 | Admitting: Family Medicine

## 2020-05-26 VITALS — BP 165/80 | HR 80 | Temp 98.5°F | Ht 71.0 in | Wt 225.0 lb

## 2020-05-26 DIAGNOSIS — E8881 Metabolic syndrome: Secondary | ICD-10-CM | POA: Diagnosis not present

## 2020-05-26 DIAGNOSIS — E782 Mixed hyperlipidemia: Secondary | ICD-10-CM | POA: Diagnosis not present

## 2020-05-26 DIAGNOSIS — E559 Vitamin D deficiency, unspecified: Secondary | ICD-10-CM | POA: Diagnosis not present

## 2020-05-26 DIAGNOSIS — Z9189 Other specified personal risk factors, not elsewhere classified: Secondary | ICD-10-CM

## 2020-05-26 DIAGNOSIS — Z6831 Body mass index (BMI) 31.0-31.9, adult: Secondary | ICD-10-CM | POA: Diagnosis not present

## 2020-05-26 DIAGNOSIS — E65 Localized adiposity: Secondary | ICD-10-CM

## 2020-05-26 DIAGNOSIS — E669 Obesity, unspecified: Secondary | ICD-10-CM

## 2020-05-27 ENCOUNTER — Other Ambulatory Visit (INDEPENDENT_AMBULATORY_CARE_PROVIDER_SITE_OTHER): Payer: Self-pay | Admitting: Family Medicine

## 2020-05-27 MED ORDER — VITAMIN D (ERGOCALCIFEROL) 1.25 MG (50000 UNIT) PO CAPS: 50000.0000 [IU] | ORAL_CAPSULE | ORAL | 0 refills | Status: DC

## 2020-05-28 NOTE — Progress Notes (Signed)
Chief Complaint:   OBESITY David Newman is here to discuss his progress with his obesity treatment plan along with follow-up of his obesity related diagnoses.   Today's visit was #: 15 Starting weight: 249 lbs Starting date: 10/16/2019 Today's weight: 225 lbs Today's date: 05/26/2020 Total lbs lost to date: 24 lbs Body mass index is 31.38 kg/m.  Total weight loss percentage to date: -9.64%  Interim History: David Newman says he had fun on his fishing trip.  He overindulged.  He is under more stress today due to being back at work.  He feels that his stress level increased his blood pressure today.  Nutrition Plan: keeping a food journal and adhering to recommended goals of 1800-2200 calories and 135 grams of protein.  Anti-obesity medications: Ozempic 0.25 mg subcutaneously weekly..  Activity: Cardio/strength training/walking for 60 minutes 3 times per week. Stress: Elevated.  Assessment/Plan:   1. Vitamin D deficiency Improving, but not optimized. Current vitamin D is 45.7, tested on 01/23/2020. Optimal goal > 50 ng/dL.   Plan:  [x]   Continue Vitamin D @50 ,000 IU every week. []   Continue home supplement daily. [x]   Follow-up for routine testing of Vitamin D at least 2-3 times per year to avoid over-replacement.  - Refill Vitamin D, Ergocalciferol, (DRISDOL) 1.25 MG (50000 UNIT) CAPS capsule; Take 1 capsule (50,000 Units total) by mouth every 7 (seven) days.  Dispense: 4 capsule; Refill: 0  2. Visceral obesity Current visceral fat rating: 17. Visceral fat rating should be < 13. Visceral adipose tissue is a hormonally active component of total body fat. This body composition phenotype is associated with medical disorders such as metabolic syndrome, cardiovascular disease and several malignancies including prostate, breast, and colorectal cancers. Starting goal: Lose 7-10% of starting weight.   3. Metabolic syndrome He is tolerating Ozempic.  Starting goal: Lose 7-10% of starting  weight. He will continue to focus on protein-rich, low simple carbohydrate foods. We reviewed the importance of hydration, regular exercise for stress reduction, and restorative sleep.  We will continue to check lab work every 3 months, with 10% weight loss, or should any other concerns arise.  4. Mixed hyperlipidemia Plan: Dietary changes: Increase soluble fiber. Decrease simple carbohydrates. Exercise changes: An average 40 minutes of moderate to vigorous-intensity aerobic activity 3 or 4 times per week.   Lab Results  Component Value Date   CHOL 238 (H) 01/23/2020   HDL 57 01/23/2020   LDLCALC 143 (H) 01/23/2020   TRIG 212 (H) 01/23/2020   CHOLHDL 4.2 01/23/2020   Lab Results  Component Value Date   ALT 23 01/23/2020   AST 22 01/23/2020   ALKPHOS 96 01/23/2020   BILITOT 0.5 01/23/2020   The 10-year ASCVD risk score Mikey Bussing DC Jr., et al., 2013) is: 12%   Values used to calculate the score:     Age: 60 years     Sex: Male     Is Non-Hispanic African American: No     Diabetic: No     Tobacco smoker: No     Systolic Blood Pressure: 488 mmHg     Is BP treated: No     HDL Cholesterol: 57 mg/dL     Total Cholesterol: 238 mg/dL  5. At risk for heart disease Due to [place] current state of health and medical condition(s), he is at a higher risk for heart disease.   This puts the patient at much greater risk to subsequently develop cardiopulmonary conditions that can significantly affect patient's quality  of life in a negative manner as well.    At least 8 minutes was spent on counseling David Newman about these concerns today. Initial goal is to lose at least 5-10% of starting weight to help reduce these risk factors.  We will continue to reassess these conditions on a fairly regular basis in an attempt to decrease patient's overall morbidity and mortality.  Evidence-based interventions for health behavior change were utilized today including the discussion of self monitoring techniques,  problem-solving barriers and SMART goal setting techniques.  Specifically regarding patient's less desirable eating habits and patterns, we employed the technique of small changes when David Newman has not been able to fully commit to his prudent nutritional plan.  6. Class 1 obesity with serious comorbidity and body mass index (BMI) of 31.0 to 31.9 in adult, unspecified obesity type  Course: David Newman is currently in the action stage of change. As such, his goal is to continue with weight loss efforts.   Nutrition goals: He has agreed to keeping a food journal and adhering to recommended goals of 1800-2200 calories and 135 grams of protein.   Exercise goals: For substantial health benefits, adults should do at least 150 minutes (2 hours and 30 minutes) a week of moderate-intensity, or 75 minutes (1 hour and 15 minutes) a week of vigorous-intensity aerobic physical activity, or an equivalent combination of moderate- and vigorous-intensity aerobic activity. Aerobic activity should be performed in episodes of at least 10 minutes, and preferably, it should be spread throughout the week.  Behavioral modification strategies: increasing lean protein intake, decreasing simple carbohydrates, increasing vegetables, increasing water intake, emotional eating strategies, travel eating strategies, holiday eating strategies  and celebration eating strategies.  David Newman has agreed to follow-up with our clinic in 3 weeks. He was informed of the importance of frequent follow-up visits to maximize his success with intensive lifestyle modifications for his multiple health conditions.   Objective:   Blood pressure (!) 165/80, pulse 80, temperature 98.5 F (36.9 C), temperature source Oral, height 5' 11"  (1.803 m), weight 225 lb (102.1 kg), SpO2 97 %. Body mass index is 31.38 kg/m.  General: Cooperative, alert, well developed, in no acute distress. HEENT: Conjunctivae and lids unremarkable. Cardiovascular: Regular  rhythm.  Lungs: Normal work of breathing. Neurologic: No focal deficits.   Lab Results  Component Value Date   CREATININE 1.17 01/23/2020   BUN 23 01/23/2020   NA 140 01/23/2020   K 5.1 01/23/2020   CL 100 01/23/2020   CO2 28 01/23/2020   Lab Results  Component Value Date   ALT 23 01/23/2020   AST 22 01/23/2020   ALKPHOS 96 01/23/2020   BILITOT 0.5 01/23/2020   Lab Results  Component Value Date   HGBA1C 5.5 01/23/2020   Lab Results  Component Value Date   TSH 1.740 10/16/2019   Lab Results  Component Value Date   CHOL 238 (H) 01/23/2020   HDL 57 01/23/2020   LDLCALC 143 (H) 01/23/2020   TRIG 212 (H) 01/23/2020   CHOLHDL 4.2 01/23/2020   Lab Results  Component Value Date   WBC 7.0 01/23/2020   HGB 14.6 01/23/2020   HCT 43.8 01/23/2020   MCV 88 01/23/2020   PLT 269 01/23/2020   Attestation Statements:   Reviewed by clinician on day of visit: allergies, medications, problem list, medical history, surgical history, family history, social history, and previous encounter notes.  I, Water quality scientist, CMA, am acting as transcriptionist for [employer], DO  I have reviewed the above  documentation for accuracy and completeness, and I agree with the above. Briscoe Deutscher, DO

## 2020-06-09 ENCOUNTER — Ambulatory Visit (INDEPENDENT_AMBULATORY_CARE_PROVIDER_SITE_OTHER): Payer: 59 | Admitting: Family Medicine

## 2020-06-09 ENCOUNTER — Other Ambulatory Visit: Payer: Self-pay

## 2020-06-09 VITALS — BP 126/76 | HR 79 | Temp 98.4°F | Ht 71.0 in | Wt 222.0 lb

## 2020-06-09 DIAGNOSIS — E559 Vitamin D deficiency, unspecified: Secondary | ICD-10-CM

## 2020-06-09 DIAGNOSIS — Z6831 Body mass index (BMI) 31.0-31.9, adult: Secondary | ICD-10-CM

## 2020-06-09 DIAGNOSIS — E538 Deficiency of other specified B group vitamins: Secondary | ICD-10-CM

## 2020-06-09 DIAGNOSIS — Z9189 Other specified personal risk factors, not elsewhere classified: Secondary | ICD-10-CM

## 2020-06-09 DIAGNOSIS — E669 Obesity, unspecified: Secondary | ICD-10-CM

## 2020-06-09 DIAGNOSIS — E8881 Metabolic syndrome: Secondary | ICD-10-CM | POA: Diagnosis not present

## 2020-06-09 DIAGNOSIS — K5909 Other constipation: Secondary | ICD-10-CM

## 2020-06-09 DIAGNOSIS — E782 Mixed hyperlipidemia: Secondary | ICD-10-CM | POA: Diagnosis not present

## 2020-06-10 ENCOUNTER — Encounter (INDEPENDENT_AMBULATORY_CARE_PROVIDER_SITE_OTHER): Payer: Self-pay | Admitting: Family Medicine

## 2020-06-10 LAB — COMPREHENSIVE METABOLIC PANEL
ALT: 19 IU/L (ref 0–44)
AST: 22 IU/L (ref 0–40)
Albumin/Globulin Ratio: 1.7 (ref 1.2–2.2)
Albumin: 4.4 g/dL (ref 3.8–4.9)
Alkaline Phosphatase: 90 IU/L (ref 44–121)
BUN/Creatinine Ratio: 14 (ref 9–20)
BUN: 18 mg/dL (ref 6–24)
Bilirubin Total: 0.3 mg/dL (ref 0.0–1.2)
CO2: 23 mmol/L (ref 20–29)
Calcium: 9.3 mg/dL (ref 8.7–10.2)
Chloride: 103 mmol/L (ref 96–106)
Creatinine, Ser: 1.27 mg/dL (ref 0.76–1.27)
GFR calc Af Amer: 72 mL/min/{1.73_m2} (ref >59)
GFR calc non Af Amer: 62 mL/min/{1.73_m2} (ref >59)
Globulin, Total: 2.6 g/dL (ref 1.5–4.5)
Glucose: 97 mg/dL (ref 65–99)
Potassium: 4.6 mmol/L (ref 3.5–5.2)
Sodium: 139 mmol/L (ref 134–144)
Total Protein: 7 g/dL (ref 6.0–8.5)

## 2020-06-10 LAB — LIPID PANEL
Chol/HDL Ratio: 2.7 ratio (ref 0.0–5.0)
Cholesterol, Total: 126 mg/dL (ref 100–199)
HDL: 47 mg/dL (ref >39)
LDL Chol Calc (NIH): 64 mg/dL (ref 0–99)
Triglycerides: 77 mg/dL (ref 0–149)
VLDL Cholesterol Cal: 15 mg/dL (ref 5–40)

## 2020-06-10 LAB — CBC WITH DIFFERENTIAL/PLATELET
Basophils Absolute: 0.1 10*3/uL (ref 0.0–0.2)
Basos: 1 %
EOS (ABSOLUTE): 0.4 10*3/uL (ref 0.0–0.4)
Eos: 6 %
Hematocrit: 42.7 % (ref 37.5–51.0)
Hemoglobin: 14.6 g/dL (ref 13.0–17.7)
Immature Grans (Abs): 0 10*3/uL (ref 0.0–0.1)
Immature Granulocytes: 0 %
Lymphocytes Absolute: 1.1 10*3/uL (ref 0.7–3.1)
Lymphs: 16 %
MCH: 29.7 pg (ref 26.6–33.0)
MCHC: 34.2 g/dL (ref 31.5–35.7)
MCV: 87 fL (ref 79–97)
Monocytes Absolute: 0.7 10*3/uL (ref 0.1–0.9)
Monocytes: 10 %
Neutrophils Absolute: 4.8 10*3/uL (ref 1.4–7.0)
Neutrophils: 67 %
Platelets: 285 10*3/uL (ref 150–450)
RBC: 4.91 x10E6/uL (ref 4.14–5.80)
RDW: 12.7 % (ref 11.6–15.4)
WBC: 7.1 10*3/uL (ref 3.4–10.8)

## 2020-06-10 LAB — INSULIN, RANDOM: INSULIN: 11 u[IU]/mL (ref 2.6–24.9)

## 2020-06-10 LAB — VITAMIN B12: Vitamin B-12: 652 pg/mL (ref 232–1245)

## 2020-06-10 LAB — VITAMIN D 25 HYDROXY (VIT D DEFICIENCY, FRACTURES): Vit D, 25-Hydroxy: 52.1 ng/mL (ref 30.0–100.0)

## 2020-06-10 LAB — LIPOPROTEIN A (LPA): Lipoprotein (a): 233.9 nmol/L — ABNORMAL HIGH (ref <75.0)

## 2020-06-10 NOTE — Progress Notes (Signed)
Chief Complaint:   OBESITY David Newman is here to discuss his progress with his obesity treatment plan along with follow-up of his obesity related diagnoses.   Today's visit was #: 16 Starting weight: 249 lbs Starting date: 10/16/2019 Today's weight: 222 lbs Today's date: 06/09/2020 Total lbs lost to date: 27 lbs Body mass index is 30.96 kg/m.  Total weight loss percentage to date: -10.84%  Interim History: David Newman is getting in 1650-1800 calories per day with Ozempic for the past 3 weeks.  He endorses constipation and has been taking Metamucil for 3-4 days.     Nutrition Plan: keeping a food journal and adhering to recommended goals of 1800-2000 calories and 135 grams of protein.  Anti-obesity medications: Ozempic 0.25 mg subcutaneously weekly. Reported side effects: Constipation. Activity: Strength training and cardio for 60 minutes 7 times per week.  Assessment/Plan:   1. Vitamin D deficiency At goal. Current vitamin D is 45.7, tested on 01/23/2020. Optimal goal > 50 ng/dL.   Plan:  [x]   Continue Vitamin D @50 ,000 IU every week. []   Continue home supplement daily. [x]   Will check vitamin D level today.  - VITAMIN D 25 Hydroxy (Vit-D Deficiency, Fractures)  2. Mixed hyperlipidemia Lipid-lowering medications: Crestor 10 mg daily.   Plan: Dietary changes: Increase soluble fiber. Decrease simple carbohydrates. Exercise changes: An average 40 minutes of moderate to vigorous-intensity aerobic activity 3 or 4 times per week.   - Comprehensive metabolic panel - Lipid panel - Lipoprotein A (LPA)  3. B12 deficiency David Newman is getting vitamin B12 injections monthly.  His vitamin B12 level was 341 on 01/23/2020.  Will check his vitamin B12 level today.   - CBC with Differential/Platelet - Vitamin B12  4. Metabolic syndrome Starting goal: Lose 7-10% of starting weight. He will continue to focus on protein-rich, low simple carbohydrate foods. We reviewed the importance of  hydration, regular exercise for stress reduction, and restorative sleep.  We will continue to check lab work every 3 months, with 10% weight loss, or should any other concerns arise.  - Insulin, random  5. Other constipation David Newman is currently taking Metamucil.  Will add MiraLAX to his regimen.    Getting to Good Bowel Health: Your goal is to have one soft bowel movement each day. Drink at least 8 glasses of water each day. Eat plenty of fiber (goal is over 30 grams each day). It is best to get most of your fiber from dietary sources which includes leafy green vegetables, fresh fruit, and whole grains. You may need to add fiber with the help of OTC fiber supplements. These include Metamucil, Citrucel, and Benefiber. If you are still having trouble, try adding Miralax. If all of these changes do not work, contact me for further direction.  6. At risk for heart disease Due to [place] current state of health and medical condition(s), he is at a higher risk for heart disease.   This puts the patient at much greater risk to subsequently develop cardiopulmonary conditions that can significantly affect patient's quality of life in a negative manner as well.    At least 8 minutes was spent on counseling David Newman about these concerns today. Initial goal is to lose at least 5-10% of starting weight to help reduce these risk factors.  We will continue to reassess these conditions on a fairly regular basis in an attempt to decrease patient's overall morbidity and mortality.  Evidence-based interventions for health behavior change were utilized today including the discussion of  self monitoring techniques, problem-solving barriers and SMART goal setting techniques.    7. Class 1 obesity with serious comorbidity and body mass index (BMI) of 31.0 to 31.9 in adult, unspecified obesity type  Course: David Newman is currently in the action stage of change. As such, his goal is to continue with weight loss efforts.    Nutrition goals: He has agreed to keeping a food journal and adhering to recommended goals of 1800-2000 calories and 135 grams of protein.  Working on [place].   Exercise goals: For substantial health benefits, adults should do at least 150 minutes (2 hours and 30 minutes) a week of moderate-intensity, or 75 minutes (1 hour and 15 minutes) a week of vigorous-intensity aerobic physical activity, or an equivalent combination of moderate- and vigorous-intensity aerobic activity. Aerobic activity should be performed in episodes of at least 10 minutes, and preferably, it should be spread throughout the week.  Behavioral modification strategies: increasing lean protein intake, decreasing simple carbohydrates, increasing vegetables and increasing water intake.  David Newman has agreed to follow-up with our clinic in 2 weeks. He was informed of the importance of frequent follow-up visits to maximize his success with intensive lifestyle modifications for his multiple health conditions.   Objective:   Blood pressure 126/76, pulse 79, temperature 98.4 F (36.9 C), temperature source Oral, height 5' 11"  (1.803 m), weight 222 lb (100.7 kg), SpO2 98 %. Body mass index is 30.96 kg/m.  General: Cooperative, alert, well developed, in no acute distress. HEENT: Conjunctivae and lids unremarkable. Cardiovascular: Regular rhythm.  Lungs: Normal work of breathing. Neurologic: No focal deficits.   Lab Results  Component Value Date   CREATININE 1.27 06/09/2020   BUN 18 06/09/2020   NA 139 06/09/2020   K 4.6 06/09/2020   CL 103 06/09/2020   CO2 23 06/09/2020   Lab Results  Component Value Date   ALT 19 06/09/2020   AST 22 06/09/2020   ALKPHOS 90 06/09/2020   BILITOT 0.3 06/09/2020   Lab Results  Component Value Date   HGBA1C 5.5 01/23/2020   Lab Results  Component Value Date   INSULIN 11.0 06/09/2020   Lab Results  Component Value Date   TSH 1.740 10/16/2019   Lab Results  Component  Value Date   CHOL 126 06/09/2020   HDL 47 06/09/2020   LDLCALC 64 06/09/2020   TRIG 77 06/09/2020   CHOLHDL 2.7 06/09/2020   Lab Results  Component Value Date   WBC 7.1 06/09/2020   HGB 14.6 06/09/2020   HCT 42.7 06/09/2020   MCV 87 06/09/2020   PLT 285 06/09/2020   Attestation Statements:   Reviewed by clinician on day of visit: allergies, medications, problem list, medical history, surgical history, family history, social history, and previous encounter notes.  I, Water quality scientist, CMA, am acting as transcriptionist for Briscoe Deutscher, DO  I have reviewed the above documentation for accuracy and completeness, and I agree with the above. Briscoe Deutscher, DO

## 2020-06-14 ENCOUNTER — Encounter (INDEPENDENT_AMBULATORY_CARE_PROVIDER_SITE_OTHER): Payer: Self-pay | Admitting: Family Medicine

## 2020-06-23 ENCOUNTER — Ambulatory Visit (INDEPENDENT_AMBULATORY_CARE_PROVIDER_SITE_OTHER): Payer: 59 | Admitting: Family Medicine

## 2020-06-23 ENCOUNTER — Other Ambulatory Visit: Payer: Self-pay | Admitting: Internal Medicine

## 2020-06-23 ENCOUNTER — Ambulatory Visit: Payer: 59 | Admitting: Internal Medicine

## 2020-06-23 ENCOUNTER — Other Ambulatory Visit: Payer: Self-pay

## 2020-06-23 ENCOUNTER — Encounter (INDEPENDENT_AMBULATORY_CARE_PROVIDER_SITE_OTHER): Payer: Self-pay | Admitting: Family Medicine

## 2020-06-23 VITALS — BP 121/75 | HR 65 | Temp 98.2°F | Ht 71.0 in | Wt 218.0 lb

## 2020-06-23 VITALS — BP 132/74 | HR 76 | Ht 71.0 in | Wt 224.8 lb

## 2020-06-23 DIAGNOSIS — K51311 Ulcerative (chronic) rectosigmoiditis with rectal bleeding: Secondary | ICD-10-CM | POA: Diagnosis not present

## 2020-06-23 DIAGNOSIS — Z9189 Other specified personal risk factors, not elsewhere classified: Secondary | ICD-10-CM

## 2020-06-23 DIAGNOSIS — E559 Vitamin D deficiency, unspecified: Secondary | ICD-10-CM

## 2020-06-23 DIAGNOSIS — E669 Obesity, unspecified: Secondary | ICD-10-CM

## 2020-06-23 DIAGNOSIS — Z6830 Body mass index (BMI) 30.0-30.9, adult: Secondary | ICD-10-CM | POA: Diagnosis not present

## 2020-06-23 DIAGNOSIS — E8881 Metabolic syndrome: Secondary | ICD-10-CM | POA: Diagnosis not present

## 2020-06-23 DIAGNOSIS — E782 Mixed hyperlipidemia: Secondary | ICD-10-CM

## 2020-06-23 MED ORDER — LIALDA 1.2 G PO TBEC: DELAYED_RELEASE_TABLET | ORAL | 3 refills | Status: DC

## 2020-06-23 MED ORDER — VITAMIN D (ERGOCALCIFEROL) 1.25 MG (50000 UNIT) PO CAPS: 50000.0000 [IU] | ORAL_CAPSULE | ORAL | 0 refills | Status: DC

## 2020-06-23 MED ORDER — MESALAMINE 4 G RE ENEM: ENEMA | RECTAL | 3 refills | Status: DC

## 2020-06-23 MED ORDER — OZEMPIC (0.25 OR 0.5 MG/DOSE) 2 MG/1.5ML ~~LOC~~ SOPN: 0.2500 mg | PEN_INJECTOR | SUBCUTANEOUS | 0 refills | Status: DC

## 2020-06-23 NOTE — Patient Instructions (Signed)
We have sent the following medications to your pharmacy for you to pick up at your convenience:  Rowasa, Lialda  Please follow up in one year

## 2020-06-23 NOTE — Progress Notes (Signed)
HISTORY OF PRESENT ILLNESS:  David Newman is a 60 y.o. 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  He was last seen in the office February 06, 2020.  At that time he was in clinical remission.  He was continued on Lialda 4.8 g daily.  He was scheduled for routine screening colonoscopy.  In the interim he developed a flare of disease for which he initiated Rowasa enema therapy.  His colonoscopy was performed April 17, 2000.  He was found to have mild active proctitis at the distal centimeters per rectum.  As well cecal patch.  Biopsies from each area were consistent with active chronic inflammatory bowel disease.  Biopsies from normal-appearing portions of the colon were normal.  Since his colonoscopy he continued on chemotherapy for about 2 to 3 weeks.  Off therapy.  Feeling well.  Has had some medication related constipation for which she is taking MiraLAX.  Does mention intermittent problems with skin eruption which seems to correlate with flares of his colitis.  He did mention this to his dermatologist.  He continues to do a good job with weight reduction as part of a structured illness program.  No new GI complaints.  He remains busy at work.  REVIEW OF SYSTEMS:  All non-GI ROS negative as otherwise stated in the HPI.  Past Medical History:  Diagnosis Date  . Anal fissure   . At risk for sleep apnea    STOP-BANG= 5    SENT TO PCP 03-27-2014  . Back pain   . CAD (coronary artery disease)   . Colon polyps    hyperplastic  . DDD (degenerative disc disease), lumbar   . GERD (gastroesophageal reflux disease)   . Gout   . High blood pressure   . Hyperlipidemia   . Joint pain   . Knee pain   . OA (osteoarthritis) of knee   . Prediabetes   . Right knee meniscal tear   . Sleep apnea    Wears CPAP.  Marland Kitchen 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[place])   . Wears contact lenses   . Wears glasses     Past Surgical History:  Procedure Laterality Date  .  COLONOSCOPY  04-24-2012  . FACIAL RECONSTRUCTION SURGERY  1973  age 64  . KNEE ARTHROSCOPY Right 03/29/2014   Procedure: RIGHT ARTHROSCOPY KNEE WITH DEBRIDMENT PARTIAL medial lateral MENISCECTOMY AND CHONDROPLASTY;  Surgeon: Sydnee Cabal, MD;  Location: [place];  Service: Orthopedics;  Laterality: Right;  . KNEE ARTHROSCOPY WITH ANTERIOR CRUCIATE LIGAMENT (ACL) REPAIR Right 1994  . KNEE ARTHROSCOPY WITH MEDIAL MENISECTOMY Left 12/28/2018   Procedure: KNEE ARTHROSCOPY WITH partial MEDIAL MENISECTOMY, debridement chondroplasty;  Surgeon: Sydnee Cabal, MD;  Location: [facility];  Service: Orthopedics;  Laterality: Left;  with knee block  . WISDOM TOOTH EXTRACTION  age 49    Social History AVISHAI REIHL  reports that he quit smoking about 13 years ago. His smoking use included cigarettes. He has a 35.00 pack-year smoking history. He has never used smokeless tobacco. He reports current alcohol use. He reports that he does not use drugs.  family history includes Alzheimer's disease in his mother; Colon cancer (age of onset: 18) in his cousin; Colon cancer (age of onset: 19) in his paternal uncle; Colon polyps (age of onset: 25) in his brother; Heart disease in his brother and father; Hyperlipidemia in his father; Hypertension in his father; Skin cancer in his father; Stroke in his father.  Allergies  Allergen Reactions  . Sulfa Antibiotics Nausea And Vomiting       PHYSICAL EXAMINATION: Vital signs: BP 132/74   Pulse 76   Ht 5' 11"  (1.803 m)   Wt 224 lb 12.8 oz (102 kg)   SpO2 97%   BMI 31.35 kg/m   Constitutional: generally well-appearing, no acute distress Psychiatric: alert and oriented x3, cooperative Eyes: extraocular movements intact, anicteric, conjunctiva pink Mouth: Mask Neck: supple no lymphadenopathy Cardiovascular: heart regular rate and rhythm, no murmur Lungs: clear to auscultation bilaterally Abdomen: soft, nontender,  nondistended, no obvious ascites, no peritoneal signs, normal bowel sounds, no organomegaly Rectal: Omitted Extremities: no clubbing, cyanosis, or lower extremity edema bilaterally Skin: no lesions on visible extremities Neuro: No focal deficits.  Cranial nerves intact  ASSESSMENT:  1.  Chronic ulcerative proctosigmoiditis.  Most recent colonoscopy with proctitis and cecal patch.  Otherwise unremarkable exam.  Currently asymptomatic on Lialda 4.8 g daily 2.  Mild constipation.  Being managed with MiraLAX   PLAN:  1.  Continue Lialda 4.8 g daily.  Medication refilled for 1 year 2.  Refill Rowasa enemas.  To have on hand. 3.  Routine GI office follow-up 1 year 4.  Routine repeat colonoscopy 5 years.  Sooner clinically indicated

## 2020-06-25 ENCOUNTER — Other Ambulatory Visit: Payer: Self-pay

## 2020-06-25 ENCOUNTER — Ambulatory Visit: Payer: 59 | Admitting: Internal Medicine

## 2020-06-25 ENCOUNTER — Encounter: Payer: Self-pay | Admitting: Internal Medicine

## 2020-06-25 VITALS — BP 114/74 | HR 65 | Ht 71.0 in | Wt 223.0 lb

## 2020-06-25 DIAGNOSIS — I2510 Atherosclerotic heart disease of native coronary artery without angina pectoris: Secondary | ICD-10-CM | POA: Diagnosis not present

## 2020-06-25 DIAGNOSIS — E7841 Elevated Lipoprotein(a): Secondary | ICD-10-CM

## 2020-06-25 DIAGNOSIS — E785 Hyperlipidemia, unspecified: Secondary | ICD-10-CM | POA: Diagnosis not present

## 2020-06-25 DIAGNOSIS — R931 Abnormal findings on diagnostic imaging of heart and coronary circulation: Secondary | ICD-10-CM | POA: Diagnosis not present

## 2020-06-25 DIAGNOSIS — Z8249 Family history of ischemic heart disease and other diseases of the circulatory system: Secondary | ICD-10-CM | POA: Diagnosis not present

## 2020-06-25 NOTE — Progress Notes (Signed)
OFFICE CONSULT NOTE  Chief Complaint:  Dyslipidemia, abnormal cardiac CT  Primary Care Physician: Aletha Halim., PA-C  HPI:  David Newman is a 60 y.o. male who is being seen today for the evaluation of dyslipidemia and abnormal cardiac CT at the request of Aletha Halim., PA-C.  This is a pleasant 60 year old male who is enrolled in the weight management center.  She was referred by Dr.Wallace for risk factor modification given an abnormal cardiac CT.  A CT coronary calcium score was performed on February 27, 2020 demonstrating in total calcium score of 336, 89th percentile for age and sex matched control.  There was some distal esophageal thickening and aortic atherosclerosis.  David Newman indicates a family history of heart disease including his brother who died at age 22 of heart disease in father who had four-vessel CABG.  He had a recent lipid profile which showed total cholesterol of 238, triglycerides 212, HDL 57 and LDL of 143.  He was then started on rosuvastatin 10 mg daily.  He is also on low-dose aspirin but no other cardiac medications.  He has a history of ulcerative colitis as well as hypertension, obstructive sleep apnea on CPAP and prediabetes.  He has recently successfully lost weight.  He reports he is made significant dietary changes and try to lower saturated fat intake.  He is a former smoker but quit 12 years ago, but had a 35-pack-year smoking history.  06/25/2020  David Newman returns today for follow-up.  He has had a marked reduction in his lipids.  He still making changes in his diet and also is taking a little higher dose of rosuvastatin more frequently.  His total cholesterol come down from 238 226, HDL is 47, triglycerides are 77 down from 212 and LDL is 64 down from 143.  He has lost another 6 pounds according to our scale.  He continues to work with the [employer] management center.  He is also worked to replete vitamin D and B12.  Warm and LP(a) which was  significantly elevated 234.  He understands after discussion today that this is genetically determined and not necessarily improved with the exercise and weight loss or statin.  PCSK9 inhibitors do lower this however at this point he would not be a candidate for it.  He might be a candidate for clinical trial.  PMHx:  Past Medical History:  Diagnosis Date  . Anal fissure   . At risk for sleep apnea    STOP-BANG= 5    SENT TO PCP 03-27-2014  . Back pain   . CAD (coronary artery disease)   . Colon polyps    hyperplastic  . DDD (degenerative disc disease), lumbar   . GERD (gastroesophageal reflux disease)   . Gout   . High blood pressure   . Hyperlipidemia   . Joint pain   . Knee pain   . OA (osteoarthritis) of knee   . Prediabetes   . Right knee meniscal tear   . Sleep apnea    Wears CPAP.  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 Wears contact lenses   . Wears glasses     Past Surgical History:  Procedure Laterality Date  . COLONOSCOPY  04-24-2012  . FACIAL RECONSTRUCTION SURGERY  1973  age 89  . KNEE ARTHROSCOPY Right 03/29/2014   Procedure: RIGHT ARTHROSCOPY KNEE WITH DEBRIDMENT PARTIAL medial lateral MENISCECTOMY AND CHONDROPLASTY;  Surgeon: Sydnee Cabal, MD;  Location: [facility];  Service: Orthopedics;  Laterality: Right;  . KNEE ARTHROSCOPY WITH ANTERIOR CRUCIATE LIGAMENT (ACL) REPAIR Right 1994  . KNEE ARTHROSCOPY WITH MEDIAL MENISECTOMY Left 12/28/2018   Procedure: KNEE ARTHROSCOPY WITH partial MEDIAL MENISECTOMY, debridement chondroplasty;  Surgeon: Sydnee Cabal, MD;  Location: [facility];  Service: Orthopedics;  Laterality: Left;  with knee block  . WISDOM TOOTH EXTRACTION  age 71    FAMHx:  Family History  Problem Relation Age of Onset  . Colon cancer Paternal Uncle 60  . Colon cancer Cousin 24  . Colon polyps Brother 54  . Heart disease Father   . Skin cancer Father   . Hypertension Father   . Hyperlipidemia Father   .  Stroke Father   . Heart disease Brother   . Alzheimer's disease Mother   . Stomach cancer Neg Hx     SOCHx:   reports that he quit smoking about 13 years ago. His smoking use included cigarettes. He has a 35.00 pack-year smoking history. He has never used smokeless tobacco. He reports current alcohol use. He reports that he does not use drugs.  ALLERGIES:  Allergies  Allergen Reactions  . Sulfa Antibiotics Nausea And Vomiting    ROS: Pertinent items noted in HPI and remainder of comprehensive ROS otherwise negative.  HOME MEDS: Current Outpatient Medications on File Prior to Visit  Medication Sig Dispense Refill  . aspirin 81 MG chewable tablet Chew 81 mg by mouth.    . calcium carbonate (OS-CAL) 1250 (500 Ca) MG chewable tablet Chew by mouth.     . calcium carbonate (TUMS - DOSED IN MG ELEMENTAL CALCIUM) 500 MG chewable tablet Chew 1 tablet by mouth as needed for indigestion or heartburn. Reported on 05/15/2015    . cyanocobalamin (,VITAMIN B-12,) 1000 MCG/ML injection 1000 mcg (1 mg) injection once per week for four weeks, followed by 1000 mcg injection once per month. 1 mL 15  . Insulin Pen Needle 32G X 4 MM MISC Use weekly with Ozempic pen 50 each 0  . LIALDA 1.2 g EC tablet TAKE 4 TABLETS (4.8 G TOTAL) BY MOUTH DAILY WITH BREAKFAST. 360 tablet 3  . LORazepam (ATIVAN) 1 MG tablet 1/2  - 1 1/2 po qhs prn    . melatonin 3 MG TABS tablet Take by mouth.    . mesalamine (ROWASA) 4 g enema INSERT 60 MLS RECTALLY TWICE DAILY HOLD FOR 30 MINUTES IF POSSIBLE. 3600 mL 3  . Naftifine HCl 2 % CREA APPLY 1 APPLICATION TO THE FEET DAILY    . NAFTIN 2 % GEL Apply 1 g topically daily.    . ondansetron (ZOFRAN ODT) 4 MG disintegrating tablet Take 1 tablet (4 mg total) by mouth every 6 (six) hours as needed. 20 tablet 0  . rosuvastatin (CRESTOR) 10 MG tablet Take 10 mg by mouth daily.    . Semaglutide,0.25 or 0.5MG/DOS, (OZEMPIC, 0.25 OR 0.5 MG/DOSE,) 2 MG/1.5ML SOPN Inject 0.25 mg into the skin  once a week. 1.5 mL 0  . Syringe/Needle, Disp, (SYRINGE 3CC/25GX1") 25G X 1" 3 ML MISC For B12 injections. 50 each 0  . Vitamin D, Ergocalciferol, (DRISDOL) 1.25 MG (50000 UNIT) CAPS capsule Take 1 capsule (50,000 Units total) by mouth every 7 (seven) days. 4 capsule 0   No current facility-administered medications on file prior to visit.    LABS/IMAGING: No results found for this or any previous visit (from the past 48 hour(s)). No results found.  LIPID PANEL:    Component Value Date/Time  CHOL 126 06/09/2020 0810   TRIG 77 06/09/2020 0810   HDL 47 06/09/2020 0810   CHOLHDL 2.7 06/09/2020 0810   LDLCALC 64 06/09/2020 0810    WEIGHTS: Wt Readings from Last 3 Encounters:  06/25/20 223 lb (101.2 kg)  06/23/20 224 lb 12.8 oz (102 kg)  06/23/20 218 lb (98.9 kg)    VITALS: Ht 5' 11"  (1.803 m)   Wt 223 lb (101.2 kg)   BMI 31.10 kg/m   EXAM: General appearance: alert and no distress Neck: no carotid bruit, no JVD and thyroid not enlarged, symmetric, no tenderness/mass/nodules Lungs: clear to auscultation bilaterally Heart: regular rate and rhythm, S1, S2 normal, no murmur, click, rub or gallop Abdomen: soft, non-tender; bowel sounds normal; no masses,  no organomegaly Extremities: extremities normal, atraumatic, no cyanosis or edema Pulses: 2+ and symmetric Skin: Skin color, texture, turgor normal. No rashes or lesions Neurologic: Grossly normal Psych: Pleasant  EKG: Normal sinus rhythm at 66-personally reviewed  ASSESSMENT: 1. 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 Aortic atherosclerosis 3. Family history of premature coronary disease 4. Hypertension 5. OSA on CPAP 6. Moderate obesity with weight loss efforts in place  PLAN: 1.   David Newman has had significant reduction in his lipids and now is at goal on rosuvastatin 10 mg daily.  He should continue to work with exercise and weight loss.  He has a high LP(a) at 234.  Although he is  not had prior coronary events, there is a very strong history of early onset heart disease.  He may be a candidate for the upcoming anti-LP(a) trial by [employer].  I will refer his name on for this.  Follow-up with me in 6 months or sooner as necessary.  Pixie Casino, MD, [facility], [place] Director of the Advanced Lipid Disorders &  Cardiovascular Risk Reduction Clinic Diplomate of the American Board of Clinical Lipidology Attending Cardiologist  Direct Dial: [phone]  Fax: [phone]  Website:  [email or web address] 06/25/2020, 8:17 AM

## 2020-06-25 NOTE — Patient Instructions (Signed)
Medication Instructions:  No changes  *If you need a refill on your cardiac medications before your next appointment, please call your pharmacy*   Lab Work: Not needed If you have labs (blood work) drawn today and your tests are completely normal, you will receive your results only by: Marland Kitchen MyChart Message (if you have MyChart) OR . A paper copy in the mail If you have any lab test that is abnormal or we need to change your treatment, we will call you to review the results.   Testing/Procedures:  Not needed  Follow-Up: At [facility] you and your health needs are our priority.  As part of our continuing mission to provide you with exceptional heart care, we have created designated Provider Care Teams.  These Care Teams include your primary Cardiologist (physician) and Advanced Practice Providers (APPs -  Physician Assistants and Nurse Practitioners) who all work together to provide you with the care you need, when you need it.     Your next appointment:   6 month(s)  The format for your next appointment:   In Person  Provider:   K. Mali Hilty, MD   Other Instructions

## 2020-06-25 NOTE — Progress Notes (Signed)
Chief Complaint:   OBESITY David Newman is here to discuss his progress with his obesity treatment plan along with follow-up of his obesity related diagnoses.   Today's visit was #: 17 Starting weight: 249 lbs Starting date: 10/16/2019 Today's weight: 218 lbs Today's date: 06/23/2020 Total lbs lost to date: 31 lbs Body mass index is 30.4 kg/m.  Total weight loss percentage to date: -12.45%  Interim History: David Newman says he is having more stress but has increased his exercise.  Nutrition Plan: keeping a food journal and adhering to recommended goals of 1800-2000 calories and 135 grams of protein for 100% of the time. Anti-obesity medications: Ozempic. Reported side effects: None. Activity: Stretching / Strength training / Cardio for 60 minutes 6-7 times per week. Stress: Elevated..  Assessment/Plan:   1. Vitamin D deficiency At goal. Current vitamin D is 52.1, tested on 06/09/2020.  Plan: Continue to take prescription Vitamin D @50 ,000 IU every week as prescribed.  Follow-up for routine testing of Vitamin D, at least 2-3 times per year to avoid over-replacement.  - Refill Vitamin D, Ergocalciferol, (DRISDOL) 1.25 MG (50000 UNIT) CAPS capsule; Take 1 capsule (50,000 Units total) by mouth every 7 (seven) days.  Dispense: 4 capsule; Refill: 0  2. Metabolic syndrome Starting goal: Lose 7-10% of starting weight. He will continue to focus on protein-rich, low simple carbohydrate foods. We reviewed the importance of hydration, regular exercise for stress reduction, and restorative sleep.  We will continue to check lab work every 3 months, with 10% weight loss, or should any other concerns arise.  - Refill Semaglutide,0.25 or 0.5MG/DOS, (OZEMPIC, 0.25 OR 0.5 MG/DOSE,) 2 MG/1.5ML SOPN; Inject 0.25 mg into the skin once a week.  Dispense: 1.5 mL; Refill: 0  3. Mixed hyperlipidemia Course:  Much improved.  Lipid-lowering medications: Crestor 10 mg daily.   Plan: Dietary changes: Increase  soluble fiber, decrease simple carbohydrates, decrease saturated fat. Exercise changes: Moderate to vigorous-intensity aerobic activity 150 minutes per week or as tolerated. We will continue to monitor along with PCP/specialists as it pertains to his weight loss journey.  Lab Results  Component Value Date   CHOL 126 06/09/2020   HDL 47 06/09/2020   LDLCALC 64 06/09/2020   TRIG 77 06/09/2020   CHOLHDL 2.7 06/09/2020   Lab Results  Component Value Date   ALT 19 06/09/2020   AST 22 06/09/2020   ALKPHOS 90 06/09/2020   BILITOT 0.3 06/09/2020   4. At risk for heart disease Due to [place] current state of health and medical condition(s), he is at a higher risk for heart disease.  This puts the patient at much greater risk to subsequently develop cardiopulmonary conditions that can significantly affect patient's quality of life in a negative manner.    At least 8 minutes were spent on counseling David Newman about these concerns today, and I stressed the importance of reversing risks factors of obesity, especially truncal and visceral fat, hypertension, hyperlipidemia, and pre-diabetes.  The initial goal is to lose at least 5-10% of starting weight to help reduce these risk factors.  Counseling:  Intensive lifestyle modifications were discussed with David Newman as the most appropriate first line of treatment.  he will continue to work on diet, exercise, and weight loss efforts.  We will continue to reassess these conditions on a fairly regular basis in an attempt to decrease the patient's overall morbidity and mortality.  Evidence-based interventions for health behavior change were utilized today including the discussion of self monitoring techniques, problem-solving  barriers, and SMART goal setting techniques.  Specifically, regarding patient's less desirable eating habits and patterns, we employed the technique of small changes when David Newman has not been able to fully commit to his prudent nutritional  plan.  5. Class 1 obesity with serious comorbidity and body mass index (BMI) of 30.0 to 30.9 in adult, unspecified obesity type  Course: David Newman is currently in the action stage of change. As such, his goal is to continue with weight loss efforts.   Nutrition goals: He has agreed to keeping a food journal and adhering to recommended goals of 1800-2200 calories and 135 grams of protein.   Exercise goals: For substantial health benefits, adults should do at least 150 minutes (2 hours and 30 minutes) a week of moderate-intensity, or 75 minutes (1 hour and 15 minutes) a week of vigorous-intensity aerobic physical activity, or an equivalent combination of moderate- and vigorous-intensity aerobic activity. Aerobic activity should be performed in episodes of at least 10 minutes, and preferably, it should be spread throughout the week.  Behavioral modification strategies: increasing lean protein intake, decreasing simple carbohydrates, increasing vegetables and increasing water intake.  David Newman has agreed to follow-up with our clinic in 2-3 weeks. He was informed of the importance of frequent follow-up visits to maximize his success with intensive lifestyle modifications for his multiple health conditions.   Objective:   Blood pressure 121/75, pulse 65, temperature 98.2 F (36.8 C), temperature source Oral, height 5' 11"  (1.803 m), weight 218 lb (98.9 kg), SpO2 97 %. Body mass index is 30.4 kg/m.  General: Cooperative, alert, well developed, in no acute distress. HEENT: Conjunctivae and lids unremarkable. Cardiovascular: Regular rhythm.  Lungs: Normal work of breathing. Neurologic: No focal deficits.   Lab Results  Component Value Date   CREATININE 1.27 06/09/2020   BUN 18 06/09/2020   NA 139 06/09/2020   K 4.6 06/09/2020   CL 103 06/09/2020   CO2 23 06/09/2020   Lab Results  Component Value Date   ALT 19 06/09/2020   AST 22 06/09/2020   ALKPHOS 90 06/09/2020   BILITOT 0.3  06/09/2020   Lab Results  Component Value Date   HGBA1C 5.5 01/23/2020   Lab Results  Component Value Date   INSULIN 11.0 06/09/2020   Lab Results  Component Value Date   TSH 1.740 10/16/2019   Lab Results  Component Value Date   CHOL 126 06/09/2020   HDL 47 06/09/2020   LDLCALC 64 06/09/2020   TRIG 77 06/09/2020   CHOLHDL 2.7 06/09/2020   Lab Results  Component Value Date   WBC 7.1 06/09/2020   HGB 14.6 06/09/2020   HCT 42.7 06/09/2020   MCV 87 06/09/2020   PLT 285 06/09/2020   Attestation Statements:   Reviewed by clinician on day of visit: allergies, medications, problem list, medical history, surgical history, family history, social history, and previous encounter notes.  I, Water quality scientist, CMA, am acting as transcriptionist for Briscoe Deutscher, DO  I have reviewed the above documentation for accuracy and completeness, and I agree with the above. Briscoe Deutscher, DO

## 2020-07-04 DIAGNOSIS — H524 Presbyopia: Secondary | ICD-10-CM | POA: Diagnosis not present

## 2020-07-09 ENCOUNTER — Other Ambulatory Visit: Payer: Self-pay

## 2020-07-09 ENCOUNTER — Ambulatory Visit (INDEPENDENT_AMBULATORY_CARE_PROVIDER_SITE_OTHER): Payer: 59 | Admitting: Family Medicine

## 2020-07-09 ENCOUNTER — Encounter (INDEPENDENT_AMBULATORY_CARE_PROVIDER_SITE_OTHER): Payer: Self-pay | Admitting: Family Medicine

## 2020-07-09 VITALS — BP 124/75 | HR 69 | Temp 98.0°F | Ht 71.0 in | Wt 218.0 lb

## 2020-07-09 DIAGNOSIS — Z9989 Dependence on other enabling machines and devices: Secondary | ICD-10-CM

## 2020-07-09 DIAGNOSIS — E8881 Metabolic syndrome: Secondary | ICD-10-CM | POA: Diagnosis not present

## 2020-07-09 DIAGNOSIS — E669 Obesity, unspecified: Secondary | ICD-10-CM

## 2020-07-09 DIAGNOSIS — Z6830 Body mass index (BMI) 30.0-30.9, adult: Secondary | ICD-10-CM

## 2020-07-09 DIAGNOSIS — E559 Vitamin D deficiency, unspecified: Secondary | ICD-10-CM | POA: Diagnosis not present

## 2020-07-09 DIAGNOSIS — Z9189 Other specified personal risk factors, not elsewhere classified: Secondary | ICD-10-CM

## 2020-07-09 DIAGNOSIS — G4733 Obstructive sleep apnea (adult) (pediatric): Secondary | ICD-10-CM | POA: Diagnosis not present

## 2020-07-09 DIAGNOSIS — R931 Abnormal findings on diagnostic imaging of heart and coronary circulation: Secondary | ICD-10-CM

## 2020-07-10 ENCOUNTER — Encounter (INDEPENDENT_AMBULATORY_CARE_PROVIDER_SITE_OTHER): Payer: Self-pay | Admitting: Family Medicine

## 2020-07-10 NOTE — Telephone Encounter (Signed)
Last OV with Dr Wallace 

## 2020-07-10 NOTE — Progress Notes (Signed)
Chief Complaint:   OBESITY David Newman is here to discuss his progress with his obesity treatment plan along with follow-up of his obesity related diagnoses.   Today's visit was #: 18 Starting weight: 249 lbs Starting date: 10/16/2019 Today's weight: 218 lbs Today's date: 07/09/2020 Total lbs lost to date: 31 lbs Body mass index is 30.4 kg/m.  Total weight loss percentage to date: -12.45%  Interim History:  + Polyphagia. Plan:  Increase Ozempic to 0.5 mg subcutaneously weekly.  Current Meal Plan: keeping a food journal and adhering to recommended goals of 1800-2200 calories and 135 grams of protein for 90% of the time.  Current Exercise Plan: Cardio/strength training for 30-45 minutes 6 days per week. Current Anti-Obesity Medications: Ozempic 0.25 mg subcutaneously weekly. Side effects: None.  Assessment/Plan:   1. OSA on CPAP OSA is a cause of systemic hypertension and is associated with an increased incidence of stroke, heart failure, atrial fibrillation, and coronary heart disease. Severe OSA increases all-cause mortality and  cardiovascular mortality.   Goal: Treatment of OSA via CPAP compliance and weight loss. . Plasma ghrelin levels (appetite or "hunger hormone") are significantly higher in OSA patients than in BMI-matched controls, but decrease to levels similar to those of obese patients without OSA after CPAP treatment.  . Weight loss improves OSA by several mechanisms, including reduction in fatty tissue in the throat (i.e. parapharyngeal fat) and the tongue. Loss of abdominal fat increases mediastinal traction on the upper airway making it less likely to collapse during sleep. . Studies have also shown that compliance with CPAP treatment improves leptin (hunger inhibitory hormone) imbalance.  2. Metabolic syndrome Starting goal: Lose 7-10% of starting weight. He will continue to focus on protein-rich, low simple carbohydrate foods. We reviewed the importance of  hydration, regular exercise for stress reduction, and restorative sleep.  We will continue to check lab work every 3 months, with 10% weight loss, or should any other concerns arise.  He is tolerating Ozempic.  Has 2 weeks left.  3. Vitamin D deficiency At goal. Current vitamin D is 52.1, tested on 06/09/2020. Optimal goal > 50 ng/dL.  He is taking vitamin D 50,000 IU weekly.  Plan: Continue to take prescription Vitamin D @50 ,000 IU every week as prescribed.  Follow-up for routine testing of Vitamin D, at least 2-3 times per year to avoid over-replacement.  4. At risk for heart disease Due to [place] current state of health and medical condition(s), he is at a higher risk for heart disease.  This puts the patient at much greater risk to subsequently develop cardiopulmonary conditions that can significantly affect patient's quality of life in a negative manner.    At least 8 minutes were spent on counseling David Newman about these concerns today, and I stressed the importance of reversing risks factors of obesity, especially truncal and visceral fat, hypertension, hyperlipidemia, and pre-diabetes.  The initial goal is to lose at least 5-10% of starting weight to help reduce these risk factors.  Counseling:  Intensive lifestyle modifications were discussed with David Newman as the most appropriate first line of treatment.  he will continue to work on diet, exercise, and weight loss efforts.  We will continue to reassess these conditions on a fairly regular basis in an attempt to decrease the patient's overall morbidity and mortality.  Evidence-based interventions for health behavior change were utilized today including the discussion of self monitoring techniques, problem-solving barriers, and SMART goal setting techniques.  Specifically, regarding patient's less desirable eating  habits and patterns, we employed the technique of small changes when David Newman has not been able to fully commit to his prudent  nutritional plan.  5. Class 1 obesity with serious comorbidity and body mass index (BMI) of 30.0 to 30.9 in adult, unspecified obesity type  Course: David Newman is currently in the action stage of change. As such, his goal is to continue with weight loss efforts.   Nutrition goals: He has agreed to keeping a food journal and adhering to recommended goals of 1800-2200 calories and 135 grams of protein.   Exercise goals: For substantial health benefits, adults should do at least 150 minutes (2 hours and 30 minutes) a week of moderate-intensity, or 75 minutes (1 hour and 15 minutes) a week of vigorous-intensity aerobic physical activity, or an equivalent combination of moderate- and vigorous-intensity aerobic activity. Aerobic activity should be performed in episodes of at least 10 minutes, and preferably, it should be spread throughout the week.  Behavioral modification strategies: increasing lean protein intake, decreasing simple carbohydrates, increasing vegetables and increasing water intake.  David Newman has agreed to follow-up with our clinic in 4 weeks. He was informed of the importance of frequent follow-up visits to maximize his success with intensive lifestyle modifications for his multiple health conditions.   Objective:   Blood pressure 124/75, pulse 69, temperature 98 F (36.7 C), temperature source Oral, height 5' 11"  (1.803 m), weight 218 lb (98.9 kg), SpO2 97 %. Body mass index is 30.4 kg/m.  General: Cooperative, alert, well developed, in no acute distress. HEENT: Conjunctivae and lids unremarkable. Cardiovascular: Regular rhythm.  Lungs: Normal work of breathing. Neurologic: No focal deficits.   Lab Results  Component Value Date   CREATININE 1.27 06/09/2020   BUN 18 06/09/2020   NA 139 06/09/2020   K 4.6 06/09/2020   CL 103 06/09/2020   CO2 23 06/09/2020   Lab Results  Component Value Date   ALT 19 06/09/2020   AST 22 06/09/2020   ALKPHOS 90 06/09/2020   BILITOT 0.3  06/09/2020   Lab Results  Component Value Date   HGBA1C 5.5 01/23/2020   Lab Results  Component Value Date   INSULIN 11.0 06/09/2020   Lab Results  Component Value Date   TSH 1.740 10/16/2019   Lab Results  Component Value Date   CHOL 126 06/09/2020   HDL 47 06/09/2020   LDLCALC 64 06/09/2020   TRIG 77 06/09/2020   CHOLHDL 2.7 06/09/2020   Lab Results  Component Value Date   WBC 7.1 06/09/2020   HGB 14.6 06/09/2020   HCT 42.7 06/09/2020   MCV 87 06/09/2020   PLT 285 06/09/2020   Attestation Statements:   Reviewed by clinician on day of visit: allergies, medications, problem list, medical history, surgical history, family history, social history, and previous encounter notes.  I, Water quality scientist, CMA, am acting as transcriptionist for Briscoe Deutscher, DO  I have reviewed the above documentation for accuracy and completeness, and I agree with the above. Briscoe Deutscher, DO

## 2020-07-14 ENCOUNTER — Telehealth: Payer: Self-pay | Admitting: Internal Medicine

## 2020-07-14 ENCOUNTER — Other Ambulatory Visit: Payer: Self-pay | Admitting: Internal Medicine

## 2020-07-14 MED ORDER — BUDESONIDE ER 9 MG PO TB24: 9.0000 mg | ORAL_TABLET | Freq: Every day | ORAL | 1 refills | Status: DC

## 2020-07-14 NOTE — Telephone Encounter (Signed)
Patient called back stating medication sent to pharmacy requires a prior authorizaition please.

## 2020-07-14 NOTE — Telephone Encounter (Signed)
Inbound call from patient requesting a call back from the nurse please.  States he is having ulcerative colitis flare-up.  Please advise.

## 2020-07-14 NOTE — Telephone Encounter (Signed)
David Newman pt calling stating he is having a  UC flare, started a little over a week ago. He is on Lialda and started using Rowasa enemas Saturday, states he should have started these sooner. He is seeing a significant about of blood. Pt reports he and Dr. Henrene Newman discussed doing a different med other than regular prednisone if he had another flare. He also started doing the rowasa in the am. As DOD please advise.

## 2020-07-14 NOTE — Telephone Encounter (Signed)
Spoke with pt and he is aware of Dr. Vena Rua recommendations. Script sent to pharmacy for pt.

## 2020-07-14 NOTE — Telephone Encounter (Signed)
Would have him continue Lialda 4.8 g daily Continue Rowasa enemas 60 mL nightly Add Uceris 9 mg daily x6 weeks  I will copy Dr. Henrene Pastor, if Uceris not helpful after appropriate time then we will need to consider prednisone (Uceris chosen over budesonide foam given the fact that at diagnosis he had colitis to the sigmoid from 0 to 25 cm)

## 2020-07-14 NOTE — Telephone Encounter (Signed)
Prior Authorization has been submitted to patient's insurance, [employer].

## 2020-07-14 NOTE — Telephone Encounter (Signed)
Noted. Thanks.

## 2020-07-16 NOTE — Telephone Encounter (Signed)
Pt called asking about status of PA for Budisonide. Pls call pt.

## 2020-07-16 NOTE — Telephone Encounter (Signed)
Patient has been advised that we have not hear back from [employer], but once we do we will contact the pharmacy.

## 2020-07-17 NOTE — Telephone Encounter (Signed)
Pt called to inform that he spoke with [facility] and was told that they have more questions for [place] so they will be faxing another form with additional questions today.

## 2020-07-22 ENCOUNTER — Encounter (INDEPENDENT_AMBULATORY_CARE_PROVIDER_SITE_OTHER): Payer: Self-pay

## 2020-07-22 ENCOUNTER — Other Ambulatory Visit (INDEPENDENT_AMBULATORY_CARE_PROVIDER_SITE_OTHER): Payer: Self-pay | Admitting: Family Medicine

## 2020-07-22 DIAGNOSIS — E8881 Metabolic syndrome: Secondary | ICD-10-CM

## 2020-07-22 DIAGNOSIS — E559 Vitamin D deficiency, unspecified: Secondary | ICD-10-CM

## 2020-07-22 NOTE — Telephone Encounter (Signed)
Patient is calling to follow up on previous message and states he received a letter from [employer] with a denial. He is requesting to speak with a nurse regarding what can be done.

## 2020-07-22 NOTE — Telephone Encounter (Signed)
Called to update the patient after contact the insurance company. I let him know that everything as far as our contact information has been updated and that they are faxing the denial to me. The company did not bother to go over any questions with me over the phone. He was understanding as advised that the next step would be to to do a letter to his insurance company.

## 2020-07-22 NOTE — Telephone Encounter (Signed)
Spoke with patient, I have advised that we never received anything from [employer] about follow up question or the denial. He gave me a phone number to call [phone] to reach out to them about the denial.

## 2020-07-22 NOTE — Telephone Encounter (Signed)
Message sent to pt.

## 2020-07-23 ENCOUNTER — Telehealth: Payer: Self-pay | Admitting: Internal Medicine

## 2020-07-23 NOTE — Telephone Encounter (Signed)
Called the insurance company once again due to not receiving denial information. They are going to fax it once again.

## 2020-07-23 NOTE — Telephone Encounter (Signed)
This medication is not a biologic, please route accordingly.

## 2020-07-24 ENCOUNTER — Other Ambulatory Visit (INDEPENDENT_AMBULATORY_CARE_PROVIDER_SITE_OTHER): Payer: Self-pay | Admitting: Family Medicine

## 2020-07-24 MED ORDER — OZEMPIC (1 MG/DOSE) 4 MG/3ML ~~LOC~~ SOPN: 1.0000 mg | PEN_INJECTOR | SUBCUTANEOUS | 0 refills | Status: DC

## 2020-07-24 MED ORDER — VITAMIN D (ERGOCALCIFEROL) 1.25 MG (50000 UNIT) PO CAPS: 50000.0000 [IU] | ORAL_CAPSULE | ORAL | 0 refills | Status: DC

## 2020-07-24 NOTE — Telephone Encounter (Signed)
Received phone call from Verita Lamb, IT consultant with [employer]. She stated that the patient had contacted her about getting the Budesonide approved and she would put in an over ride.

## 2020-07-24 NOTE — Telephone Encounter (Signed)
Appeal form and denial letter have been received this morning. Form has been faxed.

## 2020-07-24 NOTE — Telephone Encounter (Signed)
See 07/14/20 telephone note.

## 2020-07-25 ENCOUNTER — Other Ambulatory Visit (HOSPITAL_COMMUNITY): Payer: Self-pay | Admitting: Pharmacist

## 2020-07-28 ENCOUNTER — Ambulatory Visit (INDEPENDENT_AMBULATORY_CARE_PROVIDER_SITE_OTHER): Payer: 59 | Admitting: Family Medicine

## 2020-07-28 ENCOUNTER — Encounter (INDEPENDENT_AMBULATORY_CARE_PROVIDER_SITE_OTHER): Payer: Self-pay | Admitting: Family Medicine

## 2020-07-28 ENCOUNTER — Other Ambulatory Visit: Payer: Self-pay

## 2020-07-28 VITALS — BP 116/73 | HR 64 | Temp 98.0°F | Ht 71.0 in | Wt 214.0 lb

## 2020-07-28 DIAGNOSIS — Z9189 Other specified personal risk factors, not elsewhere classified: Secondary | ICD-10-CM

## 2020-07-28 DIAGNOSIS — E669 Obesity, unspecified: Secondary | ICD-10-CM | POA: Diagnosis not present

## 2020-07-28 DIAGNOSIS — K5180 Other ulcerative colitis without complications: Secondary | ICD-10-CM

## 2020-07-28 DIAGNOSIS — E538 Deficiency of other specified B group vitamins: Secondary | ICD-10-CM | POA: Diagnosis not present

## 2020-07-28 DIAGNOSIS — K5909 Other constipation: Secondary | ICD-10-CM

## 2020-07-28 DIAGNOSIS — Z6830 Body mass index (BMI) 30.0-30.9, adult: Secondary | ICD-10-CM | POA: Diagnosis not present

## 2020-07-28 DIAGNOSIS — E8881 Metabolic syndrome: Secondary | ICD-10-CM

## 2020-07-28 NOTE — Progress Notes (Signed)
Chief Complaint:   OBESITY David Newman is here to discuss his progress with his obesity treatment plan along with follow-up of his obesity related diagnoses.   Today's visit was #: 55 Starting weight: 249 lbs Starting date: 10/16/2019 Today's weight: 214 lbs Today's date: 07/28/2020 Total lbs lost to date: 35 lbs Body mass index is 29.85 kg/m.  Total weight loss percentage to date: -14.06%  Interim History: He reports a UC flare that started 2 weeks ago. Started budesonide on Friday. David Newman endorses constipation. Bowel regimen has included: MiraLAX, Metamucil, Senna, Dulcolax, and Linzess 72 mcg x 1.    Current Meal Plan: keeping a food journal and adhering to recommended goals of 1800-2200 calories and 135 grams of protein for 100% of the time.  Current Exercise Plan: Cardio/strengthening for 30-45 minutes 6 times per week. Current Anti-Obesity Medications: Ozempic 1 mg subcutaneously weekly. Side effects: Constipation.  Assessment/Plan:   1. Other constipation This problem is Worsening. Due to UC history, we will focus on resolution of flare. Plan:  Stop Ozempic.  Continue bowel regimen.  Okay to add Dulcolax at higher dose. Will ask GI for any other recommendations.   2. Other ulcerative colitis without complication (HCC) Recent flare.  Increased mesalamine enemas.  On budesonide. We will continue to monitor symptoms as they relate to his weight loss journey.  3. B12 deficiency Lab Results  Component Value Date   VITAMINB12 652 06/09/2020   At goal using vitamin B12 injections monthly. Plan:  Continue vitamin B12 injections.  4. Metabolic syndrome David Newman is taking Ozempic but may be unable to tolerate.  At this point, he has been able to lose 14% of his body weight. He is now followed by Dr. Debara Pickett at the [facility]. Plan:  We will stop Ozempic until bowel issue is resolved.    The ASCVD Risk score Mikey Bussing DC Jr., et al., 2013) failed to calculate for the following  reasons:   The valid total cholesterol range is 130 to 320 mg/dL  5. At risk for heart disease Due to [place] current state of health and medical condition(s), he is at a higher risk for heart disease.  This puts the patient at much greater risk to subsequently develop cardiopulmonary conditions that can significantly affect patient's quality of life in a negative manner.    At least 8 minutes were spent on counseling David Newman about these concerns today, and I stressed the importance of reversing risks factors of obesity, especially truncal and visceral fat, hypertension, hyperlipidemia, and pre-diabetes.  We will continue to reassess these conditions on a fairly regular basis in an attempt to decrease the patient's overall morbidity and mortality.  Evidence-based interventions for health behavior change were utilized today including the discussion of self monitoring techniques, problem-solving barriers, and SMART goal setting techniques.  Specifically, regarding patient's less desirable eating habits and patterns, we employed the technique of small changes when David Newman has not been able to fully commit to his prudent nutritional plan.  6. Class 1 obesity with serious comorbidity and body mass index (BMI) of 30.0 to 30.9 in adult, unspecified obesity type  Course: David Newman is currently in the action stage of change. As such, his goal is to continue with weight loss efforts.   Nutrition goals: He has agreed to keeping a food journal and adhering to recommended goals of 1800-2200 calories and 135 grams of protein.   Exercise goals: As is.  Behavioral modification strategies: increasing lean protein intake, decreasing simple carbohydrates, increasing  vegetables and increasing water intake.  David Newman has agreed to follow-up with our clinic in 3 weeks. He was informed of the importance of frequent follow-up visits to maximize his success with intensive lifestyle modifications for his multiple  health conditions.   Objective:   Blood pressure 116/73, pulse 64, temperature 98 F (36.7 C), temperature source Oral, height 5' 11"  (1.803 m), weight 214 lb (97.1 kg), SpO2 99 %. Body mass index is 29.85 kg/m.  General: Cooperative, alert, well developed, in no acute distress. HEENT: Conjunctivae and lids unremarkable. Cardiovascular: Regular rhythm.  Lungs: Normal work of breathing. Neurologic: No focal deficits.   Lab Results  Component Value Date   CREATININE 1.27 06/09/2020   BUN 18 06/09/2020   NA 139 06/09/2020   K 4.6 06/09/2020   CL 103 06/09/2020   CO2 23 06/09/2020   Lab Results  Component Value Date   ALT 19 06/09/2020   AST 22 06/09/2020   ALKPHOS 90 06/09/2020   BILITOT 0.3 06/09/2020   Lab Results  Component Value Date   HGBA1C 5.5 01/23/2020   Lab Results  Component Value Date   INSULIN 11.0 06/09/2020   Lab Results  Component Value Date   TSH 1.740 10/16/2019   Lab Results  Component Value Date   CHOL 126 06/09/2020   HDL 47 06/09/2020   LDLCALC 64 06/09/2020   TRIG 77 06/09/2020   CHOLHDL 2.7 06/09/2020   Lab Results  Component Value Date   WBC 7.1 06/09/2020   HGB 14.6 06/09/2020   HCT 42.7 06/09/2020   MCV 87 06/09/2020   PLT 285 06/09/2020   Attestation Statements:   Reviewed by clinician on day of visit: allergies, medications, problem list, medical history, surgical history, family history, social history, and previous encounter notes.  I, Water quality scientist, CMA, am acting as transcriptionist for Briscoe Deutscher, DO  I have reviewed the above documentation for accuracy and completeness, and I agree with the above. Briscoe Deutscher, DO

## 2020-08-11 ENCOUNTER — Other Ambulatory Visit: Payer: Self-pay

## 2020-08-11 ENCOUNTER — Encounter (INDEPENDENT_AMBULATORY_CARE_PROVIDER_SITE_OTHER): Payer: Self-pay | Admitting: Family Medicine

## 2020-08-11 ENCOUNTER — Ambulatory Visit (INDEPENDENT_AMBULATORY_CARE_PROVIDER_SITE_OTHER): Payer: 59 | Admitting: Family Medicine

## 2020-08-11 VITALS — BP 134/76 | HR 58 | Temp 98.3°F | Ht 71.0 in | Wt 220.0 lb

## 2020-08-11 DIAGNOSIS — Z9189 Other specified personal risk factors, not elsewhere classified: Secondary | ICD-10-CM | POA: Diagnosis not present

## 2020-08-11 DIAGNOSIS — E669 Obesity, unspecified: Secondary | ICD-10-CM | POA: Diagnosis not present

## 2020-08-11 DIAGNOSIS — K5903 Drug induced constipation: Secondary | ICD-10-CM | POA: Diagnosis not present

## 2020-08-11 DIAGNOSIS — Z6834 Body mass index (BMI) 34.0-34.9, adult: Secondary | ICD-10-CM | POA: Diagnosis not present

## 2020-08-11 DIAGNOSIS — E8881 Metabolic syndrome: Secondary | ICD-10-CM | POA: Diagnosis not present

## 2020-08-11 DIAGNOSIS — R632 Polyphagia: Secondary | ICD-10-CM | POA: Diagnosis not present

## 2020-08-14 ENCOUNTER — Other Ambulatory Visit (HOSPITAL_COMMUNITY): Payer: Self-pay | Admitting: Family Medicine

## 2020-08-20 NOTE — Progress Notes (Signed)
Chief Complaint:   OBESITY David Newman is here to discuss his progress with his obesity treatment plan along with follow-up of his obesity related diagnoses.   Today's visit was #: 20 Starting weight: 249 lbs Starting date: 10/16/2019 Today's weight: 220 lbs Today's date: 08/11/2020 Total lbs lost to date: 29 lbs Body mass index is 30.68 kg/m.  Total weight loss percentage to date: -11.65%  Interim History:  David Newman says his UC is much improved.  He has 1 more week of treatment.  He endorses increased polyphagia.  We discussed Vyvanse today. Current Meal Plan: keeping a food journal and adhering to recommended goals of 1800-2200 calories and 135 grams of protein for 80-85% of the time.  Current Exercise Plan: Cardio and strength training.  Assessment/Plan:   1. Metabolic syndrome Starting goal: Lose 7-10% of starting weight. He will continue to focus on protein-rich, low simple carbohydrate foods. We reviewed the importance of hydration, regular exercise for stress reduction, and restorative sleep.  We will continue to check lab work every 3 months, with 10% weight loss, or should any other concerns arise.  2. Drug-induced constipation, unable to tolerate GLP1RA David Newman was having constipation due to GLP-1 use.  He has recently stopped Ozempic.  He is also on budesonide for a UC flare.  3. Polyphagia, recently DC's Ozempic and taking Budesonide Not at goal. Current treatment: Recently stopped Ozempic due to constipation. Polyphagia refers to excessive feelings of hunger. He will continue to focus on protein-rich, low simple carbohydrate foods. We reviewed the importance of hydration, regular exercise for stress reduction, and restorative sleep.  4. At risk for impaired metabolic function Due to [place] current state of health and medical condition(s), he is at a significantly higher risk for impaired metabolic function.   At least 8 minutes was spent on counseling David Newman about  these concerns today.  This places the patient at a much greater risk to subsequently develop cardio-pulmonary conditions that can negatively affect the patient's quality of life.  I stressed the importance of reversing these risks factors.  The initial goal is to lose at least 5-10% of starting weight to help reduce risk factors.  Counseling:  Intensive lifestyle modifications discussed with David Newman as the most appropriate first line treatment.  he will continue to work on diet, exercise, and weight loss efforts.  We will continue to reassess these conditions on a fairly regular basis in an attempt to decrease the patient's overall morbidity and mortality.  5. Obesity, current BMI 30.7  Course: David Newman is currently in the action stage of change. As such, his goal is to continue with weight loss efforts.   Nutrition goals: He has agreed to keeping a food journal and adhering to recommended goals of 1800-2200 calories and 135 grams of protein.   Exercise goals: For substantial health benefits, adults should do at least 150 minutes (2 hours and 30 minutes) a week of moderate-intensity, or 75 minutes (1 hour and 15 minutes) a week of vigorous-intensity aerobic physical activity, or an equivalent combination of moderate- and vigorous-intensity aerobic activity. Aerobic activity should be performed in episodes of at least 10 minutes, and preferably, it should be spread throughout the week.  Behavioral modification strategies: increasing lean protein intake, decreasing simple carbohydrates, increasing vegetables, increasing water intake, decreasing liquid calories and decreasing alcohol intake.  David Newman has agreed to follow-up with our clinic in 3 weeks. He was informed of the importance of frequent follow-up visits to maximize his success with intensive  lifestyle modifications for his multiple health conditions.   Objective:   Blood pressure 134/76, pulse (!) 58, temperature 98.3 F (36.8 C),  temperature source Oral, height 5' 11"  (1.803 m), weight 220 lb (99.8 kg), SpO2 97 %. Body mass index is 30.68 kg/m.  General: Cooperative, alert, well developed, in no acute distress. HEENT: Conjunctivae and lids unremarkable. Cardiovascular: Regular rhythm.  Lungs: Normal work of breathing. Neurologic: No focal deficits.   Lab Results  Component Value Date   CREATININE 1.27 06/09/2020   BUN 18 06/09/2020   NA 139 06/09/2020   K 4.6 06/09/2020   CL 103 06/09/2020   CO2 23 06/09/2020   Lab Results  Component Value Date   ALT 19 06/09/2020   AST 22 06/09/2020   ALKPHOS 90 06/09/2020   BILITOT 0.3 06/09/2020   Lab Results  Component Value Date   HGBA1C 5.5 01/23/2020   Lab Results  Component Value Date   INSULIN 11.0 06/09/2020   Lab Results  Component Value Date   TSH 1.740 10/16/2019   Lab Results  Component Value Date   CHOL 126 06/09/2020   HDL 47 06/09/2020   LDLCALC 64 06/09/2020   TRIG 77 06/09/2020   CHOLHDL 2.7 06/09/2020   Lab Results  Component Value Date   WBC 7.1 06/09/2020   HGB 14.6 06/09/2020   HCT 42.7 06/09/2020   MCV 87 06/09/2020   PLT 285 06/09/2020   Attestation Statements:   Reviewed by clinician on day of visit: allergies, medications, problem list, medical history, surgical history, family history, social history, and previous encounter notes.  I, Water quality scientist, CMA, am acting as transcriptionist for Briscoe Deutscher, DO  I have reviewed the above documentation for accuracy and completeness, and I agree with the above. Briscoe Deutscher, DO

## 2020-08-27 MED FILL — Lorazepam Tab 1 MG: ORAL | 30 days supply | Qty: 45 | Fill #0 | Status: AC

## 2020-08-28 ENCOUNTER — Other Ambulatory Visit (HOSPITAL_COMMUNITY): Payer: Self-pay

## 2020-09-04 ENCOUNTER — Other Ambulatory Visit (HOSPITAL_COMMUNITY): Payer: Self-pay

## 2020-09-04 ENCOUNTER — Other Ambulatory Visit: Payer: Self-pay

## 2020-09-04 ENCOUNTER — Ambulatory Visit (INDEPENDENT_AMBULATORY_CARE_PROVIDER_SITE_OTHER): Payer: 59 | Admitting: Family Medicine

## 2020-09-04 ENCOUNTER — Encounter (INDEPENDENT_AMBULATORY_CARE_PROVIDER_SITE_OTHER): Payer: Self-pay | Admitting: Family Medicine

## 2020-09-04 VITALS — BP 148/87 | HR 71 | Temp 98.1°F | Ht 71.0 in | Wt 216.0 lb

## 2020-09-04 DIAGNOSIS — Z6834 Body mass index (BMI) 34.0-34.9, adult: Secondary | ICD-10-CM | POA: Diagnosis not present

## 2020-09-04 DIAGNOSIS — R632 Polyphagia: Secondary | ICD-10-CM | POA: Diagnosis not present

## 2020-09-04 DIAGNOSIS — E669 Obesity, unspecified: Secondary | ICD-10-CM | POA: Diagnosis not present

## 2020-09-04 DIAGNOSIS — J329 Chronic sinusitis, unspecified: Secondary | ICD-10-CM | POA: Diagnosis not present

## 2020-09-04 DIAGNOSIS — B9689 Other specified bacterial agents as the cause of diseases classified elsewhere: Secondary | ICD-10-CM | POA: Diagnosis not present

## 2020-09-04 DIAGNOSIS — E538 Deficiency of other specified B group vitamins: Secondary | ICD-10-CM

## 2020-09-04 DIAGNOSIS — K5180 Other ulcerative colitis without complications: Secondary | ICD-10-CM

## 2020-09-04 DIAGNOSIS — Z9189 Other specified personal risk factors, not elsewhere classified: Secondary | ICD-10-CM | POA: Diagnosis not present

## 2020-09-04 DIAGNOSIS — E559 Vitamin D deficiency, unspecified: Secondary | ICD-10-CM | POA: Diagnosis not present

## 2020-09-04 MED ORDER — VITAMIN D (ERGOCALCIFEROL) 1.25 MG (50000 UNIT) PO CAPS
1.0000 | ORAL_CAPSULE | ORAL | 0 refills | Status: DC
  Filled 2020-09-04: qty 4, 28d supply, fill #0

## 2020-09-04 MED ORDER — CYANOCOBALAMIN 1000 MCG/ML IJ SOLN
1000.0000 ug | INTRAMUSCULAR | 15 refills | Status: DC
  Filled 2020-09-04: qty 4, 28d supply, fill #0

## 2020-09-04 MED ORDER — AMOXICILLIN 875 MG PO TABS
875.0000 mg | ORAL_TABLET | Freq: Two times a day (BID) | ORAL | 0 refills | Status: AC
  Filled 2020-09-04: qty 20, 10d supply, fill #0

## 2020-09-05 ENCOUNTER — Other Ambulatory Visit (HOSPITAL_COMMUNITY): Payer: Self-pay

## 2020-09-05 MED FILL — Mesalamine Enema 4 GM: RECTAL | 28 days supply | Qty: 3360 | Fill #0 | Status: AC

## 2020-09-05 MED FILL — Budesonide Tab ER 24HR 9 MG: ORAL | 30 days supply | Qty: 30 | Fill #0 | Status: AC

## 2020-09-08 ENCOUNTER — Other Ambulatory Visit (HOSPITAL_COMMUNITY): Payer: Self-pay

## 2020-09-08 NOTE — Progress Notes (Signed)
Chief Complaint:   OBESITY David Newman is here to discuss his progress with his obesity treatment plan along with follow-up of his obesity related diagnoses.   Today's visit was #: 21 Starting weight: 249 lbs Starting date: 10/16/2019 Today's weight: 216 lbs Today's date: 09/04/2020 Total lbs lost to date: 33 lbs Body mass index is 30.13 kg/m.  Total weight loss percentage to date: -13.25%  Interim History:  David Newman currently has sinusitis.  Current Meal Plan: keeping a food journal and adhering to recommended goals of 1800-2200 calories and 135 grams of protein for 80% of the time.  Current Exercise Plan: Cardio/strength training for 30-45 minutes 5-6 times per week.  Assessment/Plan:   1. Vitamin D deficiency At goal. Current vitamin D is 52.1, tested on 06/09/2020. Optimal goal > 50 ng/dL.  She is taking vitamin D 50,000 IU weekly.  Plan: Continue to take prescription Vitamin D @50 ,000 IU every week as prescribed.  Follow-up for routine testing of Vitamin D, at least 2-3 times per year to avoid over-replacement.  - Refill Vitamin D, Ergocalciferol, (DRISDOL) 1.25 MG (50000 UNIT) CAPS capsule; Take 1 capsule (50,000 Units total) by mouth every 7 (seven) days.  Dispense: 4 capsule; Refill: 0  2. B12 deficiency Lab Results  Component Value Date   VITAMINB12 652 06/09/2020   Supplementation: Vitamin B12 1000 mcg/mL every 30 days.   Plan:  Continue current treatment.  Will refill today.   - Refill cyanocobalamin (,VITAMIN B-12,) 1000 MCG/ML injection; Inject 1 mL (1,000 mcg total) into the skin once a week for 4 weeks then 1 mL every 30 (thirty) days.  Dispense: 1 mL; Refill: 15  3. Other ulcerative colitis without complication ([place]) Doing well.  Off budesonide.  Felt a significant increase in appetite and increased edema when taking.  Wonders if too strong.  4. Polyphagia Not at goal. Current treatment: None.  He does not tolerate a GLP-1 due to constipation.    Polyphagia refers to excessive feelings of hunger. He will continue to focus on protein-rich, low simple carbohydrate foods. We reviewed the importance of hydration, regular exercise for stress reduction, and restorative sleep.  5. Bacterial sinusitis New.  Seasonal allergies.  He has had sinus pressure for >10 days.  Plan:  Will start him on amoxicillin 875 mg twice daily for 10 days for sinusitis.  - Start amoxicillin (AMOXIL) 875 MG tablet; Take 1 tablet (875 mg total) by mouth 2 (two) times daily for 10 days.  Dispense: 20 tablet; Refill: 0  6. At high risk for fluid overload David Newman is at a higher than average risk for fluid retention due to obesity and medications. Reviewed: no chest pain on exertion, no dyspnea at rest, and no swelling of ankles.  David Newman was given approximately 15 minutes of fluid retention prevention counseling today. He is 60 y.o. male and has risk factors for fluid retention including obesity. We discussed intensive lifestyle modifications today with an emphasis on specific weight loss instructions, proper nutrition and exercise strategies.   7. Obesity, current BMI 30.2  Course: David Newman is currently in the action stage of change. As such, his goal is to continue with weight loss efforts.   Nutrition goals: He has agreed to keeping a food journal and adhering to recommended goals of 1800-2200 calories and 135 grams of protein.   Exercise goals: For substantial health benefits, adults should do at least 150 minutes (2 hours and 30 minutes) a week of moderate-intensity, or 75 minutes (1 hour  and 15 minutes) a week of vigorous-intensity aerobic physical activity, or an equivalent combination of moderate- and vigorous-intensity aerobic activity. Aerobic activity should be performed in episodes of at least 10 minutes, and preferably, it should be spread throughout the week.  Behavioral modification strategies: increasing lean protein intake, decreasing simple  carbohydrates, increasing vegetables and increasing water intake.  David Newman has agreed to follow-up with our clinic in 2-3 weeks. He was informed of the importance of frequent follow-up visits to maximize his success with intensive lifestyle modifications for his multiple health conditions.   Objective:   Blood pressure (!) 148/87, pulse 71, temperature 98.1 F (36.7 C), temperature source Oral, height 5' 11"  (1.803 m), weight 216 lb (98 kg), SpO2 98 %. Body mass index is 30.13 kg/m.  General: Cooperative, alert, well developed, in no acute distress. HEENT: Conjunctivae and lids unremarkable. Cardiovascular: Regular rhythm.  Lungs: Normal work of breathing. Neurologic: No focal deficits.   Lab Results  Component Value Date   CREATININE 1.27 06/09/2020   BUN 18 06/09/2020   NA 139 06/09/2020   K 4.6 06/09/2020   CL 103 06/09/2020   CO2 23 06/09/2020   Lab Results  Component Value Date   ALT 19 06/09/2020   AST 22 06/09/2020   ALKPHOS 90 06/09/2020   BILITOT 0.3 06/09/2020   Lab Results  Component Value Date   HGBA1C 5.5 01/23/2020   Lab Results  Component Value Date   INSULIN 11.0 06/09/2020   Lab Results  Component Value Date   TSH 1.740 10/16/2019   Lab Results  Component Value Date   CHOL 126 06/09/2020   HDL 47 06/09/2020   LDLCALC 64 06/09/2020   TRIG 77 06/09/2020   CHOLHDL 2.7 06/09/2020   Lab Results  Component Value Date   WBC 7.1 06/09/2020   HGB 14.6 06/09/2020   HCT 42.7 06/09/2020   MCV 87 06/09/2020   PLT 285 06/09/2020   Attestation Statements:   Reviewed by clinician on day of visit: allergies, medications, problem list, medical history, surgical history, family history, social history, and previous encounter notes.  I, Water quality scientist, CMA, am acting as transcriptionist for Briscoe Deutscher, DO  I have reviewed the above documentation for accuracy and completeness, and I agree with the above. Briscoe Deutscher, DO

## 2020-09-25 ENCOUNTER — Encounter (INDEPENDENT_AMBULATORY_CARE_PROVIDER_SITE_OTHER): Payer: Self-pay | Admitting: Family Medicine

## 2020-09-25 ENCOUNTER — Other Ambulatory Visit (HOSPITAL_COMMUNITY): Payer: Self-pay

## 2020-09-25 ENCOUNTER — Ambulatory Visit (INDEPENDENT_AMBULATORY_CARE_PROVIDER_SITE_OTHER): Payer: 59 | Admitting: Family Medicine

## 2020-09-25 ENCOUNTER — Other Ambulatory Visit: Payer: Self-pay

## 2020-09-25 VITALS — BP 144/77 | HR 81 | Temp 98.5°F | Ht 71.0 in | Wt 222.0 lb

## 2020-09-25 DIAGNOSIS — E65 Localized adiposity: Secondary | ICD-10-CM | POA: Diagnosis not present

## 2020-09-25 DIAGNOSIS — Z9989 Dependence on other enabling machines and devices: Secondary | ICD-10-CM | POA: Diagnosis not present

## 2020-09-25 DIAGNOSIS — Z6834 Body mass index (BMI) 34.0-34.9, adult: Secondary | ICD-10-CM | POA: Diagnosis not present

## 2020-09-25 DIAGNOSIS — Z9189 Other specified personal risk factors, not elsewhere classified: Secondary | ICD-10-CM

## 2020-09-25 DIAGNOSIS — G4733 Obstructive sleep apnea (adult) (pediatric): Secondary | ICD-10-CM

## 2020-09-25 DIAGNOSIS — E538 Deficiency of other specified B group vitamins: Secondary | ICD-10-CM | POA: Diagnosis not present

## 2020-09-25 DIAGNOSIS — E559 Vitamin D deficiency, unspecified: Secondary | ICD-10-CM | POA: Diagnosis not present

## 2020-09-25 DIAGNOSIS — E669 Obesity, unspecified: Secondary | ICD-10-CM

## 2020-09-25 MED ORDER — "SYRINGE 25G X 1"" 3 ML MISC"
0 refills | Status: DC
  Filled 2020-09-25: qty 50, 50d supply, fill #0

## 2020-09-25 MED ORDER — VITAMIN D (ERGOCALCIFEROL) 1.25 MG (50000 UNIT) PO CAPS
1.0000 | ORAL_CAPSULE | ORAL | 0 refills | Status: DC
  Filled 2020-09-25: qty 4, 28d supply, fill #0

## 2020-09-25 MED FILL — Mesalamine Tab Delayed Release 1.2 GM: ORAL | 90 days supply | Qty: 360 | Fill #0 | Status: AC

## 2020-09-26 ENCOUNTER — Other Ambulatory Visit (HOSPITAL_COMMUNITY): Payer: Self-pay

## 2020-09-29 ENCOUNTER — Other Ambulatory Visit (HOSPITAL_COMMUNITY): Payer: Self-pay

## 2020-09-29 MED ORDER — CARESTART COVID-19 HOME TEST VI KIT
PACK | 0 refills | Status: DC
  Filled 2020-09-29: qty 4, 4d supply, fill #0

## 2020-10-01 NOTE — Progress Notes (Signed)
Chief Complaint:   OBESITY David Newman is here to discuss his progress with his obesity treatment plan along with follow-up of his obesity related diagnoses.   Today's visit was #: 22 Starting weight: 249 lbs Starting date: 10/16/2019 Today's weight: 222 lbs Today's date: 09/25/2020 Weight change since last visit: +4 lbs Total lbs lost to date: 27 lbs Body mass index is 30.96 kg/m.  Total weight loss percentage to date: -10.84%  Current Meal Plan: keeping a food journal and adhering to recommended goals of 1800-2200 calories and 135 grams of protein for most of the time.  Current Exercise Plan: Cardo/strength training/walking for 40 minutes 3-4 times per week. Current Anti-Obesity Medications: Ozempic 1 mg subcutaneously weekly. Side effects: None.  Assessment/Plan:   Meds ordered this encounter  Medications  . Syringe/Needle, Disp, (SYRINGE 3CC/25GX1") 25G X 1" 3 ML MISC    Sig: For B12 injections.    Dispense:  50 each    Refill:  0  . Vitamin D, Ergocalciferol, (DRISDOL) 1.25 MG (50000 UNIT) CAPS capsule    Sig: Take 1 capsule (50,000 Units total) by mouth every 7 (seven) days.    Dispense:  4 capsule    Refill:  0    1. B12 deficiency Lab Results  Component Value Date   VITAMINB12 652 06/09/2020   Supplementation: vitamin B12 1,000 mcg/mL IM every month.   Plan:  Continue current treatment.  Will refill syringes today.   - Refill Syringe/Needle, Disp, (SYRINGE 3CC/25GX1") 25G X 1" 3 ML MISC; For B12 injections.  Dispense: 50 each; Refill: 0  2. Vitamin D deficiency At goal. Current vitamin D is 52.1, tested on 06/09/2020. Optimal goal > 50 ng/dL.  She is taking vitamin D 50,000 IU weekly.  Plan: Continue to take prescription Vitamin D @50 ,000 IU every week as prescribed.  Follow-up for routine testing of Vitamin D, at least 2-3 times per year to avoid over-replacement.  - Refill Vitamin D, Ergocalciferol, (DRISDOL) 1.25 MG (50000 UNIT) CAPS capsule; Take 1  capsule (50,000 Units total) by mouth every 7 (seven) days.  Dispense: 4 capsule; Refill: 0  3. OSA on CPAP OSA is a cause of systemic hypertension and is associated with an increased incidence of stroke, heart failure, atrial fibrillation, and coronary heart disease. Severe OSA increases all-cause mortality and  cardiovascular mortality.   Goal: Treatment of OSA via CPAP compliance and weight loss. . Plasma ghrelin levels (appetite or "hunger hormone") are significantly higher in OSA patients than in BMI-matched controls, but decrease to levels similar to those of obese patients without OSA after CPAP treatment.  . Weight loss improves OSA by several mechanisms, including reduction in fatty tissue in the throat (i.e. parapharyngeal fat) and the tongue. Loss of abdominal fat increases mediastinal traction on the upper airway making it less likely to collapse during sleep. . Studies have also shown that compliance with CPAP treatment improves leptin (hunger inhibitory hormone) imbalance.  4. Visceral obesity Current visceral fat rating: 31.0. Visceral fat rating should be < 13. Visceral adipose tissue is a hormonally active component of total body fat. This body composition phenotype is associated with medical disorders such as metabolic syndrome, cardiovascular disease and several malignancies including prostate, breast, and colorectal cancers. Starting goal: Lose 7-10% of starting weight.   5. At risk for anxiety David Newman was given approximately 8 minutes of anxiety risk counseling today.  She is at higher risk for anxiety due to his daughter living with him this  summer. The patient has risk factors for this condition.  We discussed the importance of a healthy work/life balance, a healthy relationship with food, and a good support system.  We discussed various strategies to help cope with these emotions as well.  I recommended counseling, meditation/prayer, healthy eating habits, sleep  hygiene, and exercising to help manage these feelings.   6. Obesity, current BMI 31  Course: David Newman is currently in the action stage of change. As such, his goal is to continue with weight loss efforts.   Nutrition goals: He has agreed to keeping a food journal and adhering to recommended goals of 1800-2200 calories and 135 grams of protein.   Exercise goals: As is.  Behavioral modification strategies: increasing lean protein intake, decreasing simple carbohydrates, increasing vegetables and increasing water intake.  David Newman has agreed to follow-up with our clinic in 3 weeks. He was informed of the importance of frequent follow-up visits to maximize his success with intensive lifestyle modifications for his multiple health conditions.   Objective:   Blood pressure (!) 144/77, pulse 81, temperature 98.5 F (36.9 C), temperature source Oral, height 5' 11"  (1.803 m), weight 222 lb (100.7 kg), SpO2 96 %. Body mass index is 30.96 kg/m.  General: Cooperative, alert, well developed, in no acute distress. HEENT: Conjunctivae and lids unremarkable. Cardiovascular: Regular rhythm.  Lungs: Normal work of breathing. Neurologic: No focal deficits.   Lab Results  Component Value Date   CREATININE 1.27 06/09/2020   BUN 18 06/09/2020   NA 139 06/09/2020   K 4.6 06/09/2020   CL 103 06/09/2020   CO2 23 06/09/2020   Lab Results  Component Value Date   ALT 19 06/09/2020   AST 22 06/09/2020   ALKPHOS 90 06/09/2020   BILITOT 0.3 06/09/2020   Lab Results  Component Value Date   HGBA1C 5.5 01/23/2020   Lab Results  Component Value Date   INSULIN 11.0 06/09/2020   Lab Results  Component Value Date   TSH 1.740 10/16/2019   Lab Results  Component Value Date   CHOL 126 06/09/2020   HDL 47 06/09/2020   LDLCALC 64 06/09/2020   TRIG 77 06/09/2020   CHOLHDL 2.7 06/09/2020   Lab Results  Component Value Date   WBC 7.1 06/09/2020   HGB 14.6 06/09/2020   HCT 42.7 06/09/2020   MCV  87 06/09/2020   PLT 285 06/09/2020   Attestation Statements:   Reviewed by clinician on day of visit: allergies, medications, problem list, medical history, surgical history, family history, social history, and previous encounter notes.  I, Water quality scientist, CMA, am acting as transcriptionist for Briscoe Deutscher, DO  I have reviewed the above documentation for accuracy and completeness, and I agree with the above. Briscoe Deutscher, DO

## 2020-10-16 ENCOUNTER — Other Ambulatory Visit (HOSPITAL_COMMUNITY): Payer: Self-pay

## 2020-10-16 ENCOUNTER — Ambulatory Visit (INDEPENDENT_AMBULATORY_CARE_PROVIDER_SITE_OTHER): Payer: 59 | Admitting: Family Medicine

## 2020-10-16 ENCOUNTER — Encounter (INDEPENDENT_AMBULATORY_CARE_PROVIDER_SITE_OTHER): Payer: Self-pay | Admitting: Family Medicine

## 2020-10-16 ENCOUNTER — Other Ambulatory Visit: Payer: Self-pay

## 2020-10-16 VITALS — BP 138/83 | HR 67 | Temp 98.4°F | Ht 71.0 in | Wt 224.0 lb

## 2020-10-16 DIAGNOSIS — Z6834 Body mass index (BMI) 34.0-34.9, adult: Secondary | ICD-10-CM | POA: Diagnosis not present

## 2020-10-16 DIAGNOSIS — E669 Obesity, unspecified: Secondary | ICD-10-CM | POA: Diagnosis not present

## 2020-10-16 DIAGNOSIS — E559 Vitamin D deficiency, unspecified: Secondary | ICD-10-CM

## 2020-10-16 DIAGNOSIS — Z9189 Other specified personal risk factors, not elsewhere classified: Secondary | ICD-10-CM

## 2020-10-16 DIAGNOSIS — R632 Polyphagia: Secondary | ICD-10-CM

## 2020-10-16 DIAGNOSIS — E8881 Metabolic syndrome: Secondary | ICD-10-CM

## 2020-10-16 MED ORDER — VITAMIN D (ERGOCALCIFEROL) 1.25 MG (50000 UNIT) PO CAPS
1.0000 | ORAL_CAPSULE | ORAL | 0 refills | Status: DC
  Filled 2020-10-16 – 2020-10-22 (×2): qty 4, 28d supply, fill #0

## 2020-10-22 ENCOUNTER — Other Ambulatory Visit (HOSPITAL_COMMUNITY): Payer: Self-pay

## 2020-10-22 NOTE — Progress Notes (Signed)
Chief Complaint:   OBESITY David Newman is here to discuss his progress with his obesity treatment plan along with follow-up of his obesity related diagnoses.   Today's visit was #: 23 Starting weight: 249 lbs Starting date: 10/16/2019 Today's weight: 224 lbs Today's date: 10/16/2020 Weight change since last visit: +2 lbs Total lbs lost to date: 25 lbs Body mass index is 31.24 kg/m.  Total weight loss percentage to date: -10.04%  Interim History:  David Newman says that he overindulged while on vacation.   Current Meal Plan: keeping a food journal and adhering to recommended goals of 1800-2200 calories and 135 grams of protein for 50% of the time.  Current Exercise Plan: Cardio/strength/walking for 40 minutes 2-3 times per week.  Assessment/Plan:   Meds ordered this encounter  Medications   Vitamin D, Ergocalciferol, (DRISDOL) 1.25 MG (50000 UNIT) CAPS capsule    Sig: Take 1 capsule (50,000 Units total) by mouth every 7 (seven) days.    Dispense:  4 capsule    Refill:  0    1. Vitamin D deficiency At goal. Current vitamin D is 52.1, tested on 06/09/2020. He is taking vitamin D 50,000 IU weekly.   Plan: Continue to take prescription Vitamin D @50 ,000 IU every week as prescribed.  Follow-up for routine testing of Vitamin D, at least 2-3 times per year to avoid over-replacement.  - Refill Vitamin D, Ergocalciferol, (DRISDOL) 1.25 MG (50000 UNIT) CAPS capsule; Take 1 capsule (50,000 Units total) by mouth every 7 (seven) days.  Dispense: 4 capsule; Refill: 0  2. Polyphagia Controlled. Current treatment: None. Polyphagia refers to excessive feelings of hunger. He will continue to focus on protein-rich, low simple carbohydrate foods. We reviewed the importance of hydration, regular exercise for stress reduction, and restorative sleep.  3. Metabolic syndrome Meeting goal of 10% of total weight loss. He will continue to focus on protein-rich, low simple carbohydrate foods. We reviewed the  importance of hydration, regular exercise for stress reduction, and restorative sleep.  We will continue to check lab work every 3 months, with 10% weight loss, or should any other concerns arise.  4. At risk for heart disease Due to [place] current state of health and medical condition(s), he is at a higher risk for heart disease.  This puts the patient at much greater risk to subsequently develop cardiopulmonary conditions that can significantly affect patient's quality of life in a negative manner.    At least 8 minutes were spent on counseling David Newman about these concerns today. Evidence-based interventions for health behavior change were utilized today including the discussion of self monitoring techniques, problem-solving barriers, and SMART goal setting techniques.  Specifically, regarding patient's less desirable eating habits and patterns, we employed the technique of small changes when David Newman has not been able to fully commit to his prudent nutritional plan.  5. Obesity, current BMI 31.4  Course: David Newman is currently in the action stage of change. As such, his goal is to continue with weight loss efforts.   Nutrition goals: He has agreed to keeping a food journal and adhering to recommended goals of 1800-2200 calories and 135 grams of protein.   Exercise goals:  As is.  Behavioral modification strategies: increasing lean protein intake, decreasing simple carbohydrates, increasing vegetables and increasing water intake.  David Newman has agreed to follow-up with our clinic in 3 weeks. He was informed of the importance of frequent follow-up visits to maximize his success with intensive lifestyle modifications for his multiple health conditions.  Objective:   Blood pressure 138/83, pulse 67, temperature 98.4 F (36.9 C), temperature source Oral, height 5' 11"  (1.803 m), weight 224 lb (101.6 kg), SpO2 98 %. Body mass index is 31.24 kg/m.  General: Cooperative, alert, well  developed, in no acute distress. HEENT: Conjunctivae and lids unremarkable. Cardiovascular: Regular rhythm.  Lungs: Normal work of breathing. Neurologic: No focal deficits.   Lab Results  Component Value Date   CREATININE 1.27 06/09/2020   BUN 18 06/09/2020   NA 139 06/09/2020   K 4.6 06/09/2020   CL 103 06/09/2020   CO2 23 06/09/2020   Lab Results  Component Value Date   ALT 19 06/09/2020   AST 22 06/09/2020   ALKPHOS 90 06/09/2020   BILITOT 0.3 06/09/2020   Lab Results  Component Value Date   HGBA1C 5.5 01/23/2020   Lab Results  Component Value Date   INSULIN 11.0 06/09/2020   Lab Results  Component Value Date   TSH 1.740 10/16/2019   Lab Results  Component Value Date   CHOL 126 06/09/2020   HDL 47 06/09/2020   LDLCALC 64 06/09/2020   TRIG 77 06/09/2020   CHOLHDL 2.7 06/09/2020   Lab Results  Component Value Date   WBC 7.1 06/09/2020   HGB 14.6 06/09/2020   HCT 42.7 06/09/2020   MCV 87 06/09/2020   PLT 285 06/09/2020   Attestation Statements:   Reviewed by clinician on day of visit: allergies, medications, problem list, medical history, surgical history, family history, social history, and previous encounter notes.  I, Water quality scientist, CMA, am acting as transcriptionist for Briscoe Deutscher, DO  I have reviewed the above documentation for accuracy and completeness, and I agree with the above. Briscoe Deutscher, DO

## 2020-10-23 ENCOUNTER — Other Ambulatory Visit (HOSPITAL_COMMUNITY): Payer: Self-pay

## 2020-10-23 MED ORDER — LORAZEPAM 1 MG PO TABS
0.5000 mg | ORAL_TABLET | Freq: Every evening | ORAL | 5 refills | Status: DC | PRN
  Filled 2020-10-23: qty 45, 30d supply, fill #0
  Filled 2020-11-25: qty 45, 30d supply, fill #1
  Filled 2020-12-28: qty 45, 30d supply, fill #2
  Filled 2021-01-29: qty 45, 30d supply, fill #3
  Filled 2021-03-03: qty 45, 30d supply, fill #4

## 2020-10-31 ENCOUNTER — Ambulatory Visit: Payer: 59

## 2020-10-31 NOTE — Progress Notes (Signed)
   Covid-19 Vaccination Clinic  Name:  David Newman    MRN: [id number] DOB: May 31, 1960  10/31/2020  David Newman was observed post Covid-19 immunization for 15 minutes without incident. He was provided with Vaccine Information Sheet and instruction to access the V-Safe system.   David Newman was instructed to call 911 with any severe reactions post vaccine: Difficulty breathing  Swelling of face and throat  A fast heartbeat  A bad rash all over body  Dizziness and weakness

## 2020-11-03 ENCOUNTER — Other Ambulatory Visit (HOSPITAL_BASED_OUTPATIENT_CLINIC_OR_DEPARTMENT_OTHER): Payer: Self-pay

## 2020-11-03 MED ORDER — COVID-19 MRNA VAC-TRIS(PFIZER) 30 MCG/0.3ML IM SUSP
INTRAMUSCULAR | 0 refills | Status: DC
  Filled 2020-11-03: qty 0.3, 1d supply, fill #0

## 2020-11-05 ENCOUNTER — Ambulatory Visit (INDEPENDENT_AMBULATORY_CARE_PROVIDER_SITE_OTHER): Payer: 59 | Admitting: Family Medicine

## 2020-11-05 ENCOUNTER — Other Ambulatory Visit: Payer: Self-pay

## 2020-11-05 ENCOUNTER — Encounter (INDEPENDENT_AMBULATORY_CARE_PROVIDER_SITE_OTHER): Payer: Self-pay | Admitting: Family Medicine

## 2020-11-05 VITALS — BP 124/76 | HR 65 | Temp 98.5°F | Ht 71.0 in | Wt 224.0 lb

## 2020-11-05 DIAGNOSIS — E8881 Metabolic syndrome: Secondary | ICD-10-CM

## 2020-11-05 DIAGNOSIS — E669 Obesity, unspecified: Secondary | ICD-10-CM

## 2020-11-05 DIAGNOSIS — Z9189 Other specified personal risk factors, not elsewhere classified: Secondary | ICD-10-CM

## 2020-11-05 DIAGNOSIS — R632 Polyphagia: Secondary | ICD-10-CM | POA: Diagnosis not present

## 2020-11-05 DIAGNOSIS — Z79899 Other long term (current) drug therapy: Secondary | ICD-10-CM

## 2020-11-05 DIAGNOSIS — K5180 Other ulcerative colitis without complications: Secondary | ICD-10-CM

## 2020-11-05 DIAGNOSIS — Z6834 Body mass index (BMI) 34.0-34.9, adult: Secondary | ICD-10-CM | POA: Diagnosis not present

## 2020-11-05 NOTE — Progress Notes (Signed)
Chief Complaint:   OBESITY David Newman is here to discuss his progress with his obesity treatment plan along with follow-up of his obesity related diagnoses. See Medical Weight Management Flowsheet for complete bioelectrical impedance results.  Today's visit was #: 24 Starting weight: 249 lbs Starting date: 10/16/2019 Today's weight: 224 lbs Today's date: 11/05/2020 Weight change since last visit: 0 Total lbs lost to date: 25 lbs Body mass index is 31.24 kg/m.  Total weight loss percentage to date: -10.04%  Interim History: David Newman says he has started exercising at lunch.  He goes out with his coworkers every few weeks.  He says he is still worried about his daughter.  He had ETOH last weekend, but still not often.  Nutrition Plan: keeping a food journal and adhering to recommended goals of 1800-2200 calories and 135 grams of protein 85% of the time. Activity:  Strength training/cardio/walking for 40-60 minutes 5-6 times per week.  Assessment/Plan:   1. Polyphagia Not optimized. Current treatment: None. Polyphagia refers to excessive feelings of hunger.   Plan:  He would like to retry Ozempic.  Still has at home.  Okay 0.25 mg subcutaneously weekly.  Precautions reviewed.  He will continue to focus on protein-rich, low simple carbohydrate foods. We reviewed the importance of hydration, regular exercise for stress reduction, and restorative sleep.  2. Metabolic syndrome Starting goal: Lose 7-10% of starting weight. He will continue to focus on protein-rich, low simple carbohydrate foods. We reviewed the importance of hydration, regular exercise for stress reduction, and restorative sleep.  We will continue to check lab work every 3 months, with 10% weight loss, or should any other concerns arise.  3. Medication management UC flare each year after flu vaccine x5 years.  Will give letter to report this.   4. Other ulcerative colitis without complication ([place]) Doing well, but has a  flare after getting the flu vaccine.  5. At risk for heart disease Due to [place] current state of health and medical condition(s), he is at a higher risk for heart disease.  This puts the patient at much greater risk to subsequently develop cardiopulmonary conditions that can significantly affect patient's quality of life in a negative manner.    At least 8 minutes were spent on counseling David Newman about these concerns today. Evidence-based interventions for health behavior change were utilized today including the discussion of self monitoring techniques, problem-solving barriers, and SMART goal setting techniques.  Specifically, regarding patient's less desirable eating habits and patterns, we employed the technique of small changes when David Newman has not been able to fully commit to his prudent nutritional plan.  6. Obesity, current BMI 31.3  Course: David Newman is currently in the action stage of change. As such, his goal is to continue with weight loss efforts.   Nutrition goals: He has agreed to keeping a food journal and adhering to recommended goals of 1800-2200 calories and 135 grams of protein.   Exercise goals:  As is.  Behavioral modification strategies: increasing lean protein intake, decreasing simple carbohydrates, increasing vegetables, and increasing water intake.  David Newman has agreed to follow-up with our clinic in 4 weeks. He was informed of the importance of frequent follow-up visits to maximize his success with intensive lifestyle modifications for his multiple health conditions.   Objective:   Blood pressure 124/76, pulse 65, temperature 98.5 F (36.9 C), temperature source Oral, height 5' 11"  (1.803 m), weight 224 lb (101.6 kg), SpO2 96 %. Body mass index is 31.24 kg/m.  General:  Cooperative, alert, well developed, in no acute distress. HEENT: Conjunctivae and lids unremarkable. Cardiovascular: Regular rhythm.  Lungs: Normal work of breathing. Neurologic: No focal  deficits.   Lab Results  Component Value Date   CREATININE 1.27 06/09/2020   BUN 18 06/09/2020   NA 139 06/09/2020   K 4.6 06/09/2020   CL 103 06/09/2020   CO2 23 06/09/2020   Lab Results  Component Value Date   ALT 19 06/09/2020   AST 22 06/09/2020   ALKPHOS 90 06/09/2020   BILITOT 0.3 06/09/2020   Lab Results  Component Value Date   HGBA1C 5.5 01/23/2020   Lab Results  Component Value Date   INSULIN 11.0 06/09/2020   Lab Results  Component Value Date   TSH 1.740 10/16/2019   Lab Results  Component Value Date   CHOL 126 06/09/2020   HDL 47 06/09/2020   LDLCALC 64 06/09/2020   TRIG 77 06/09/2020   CHOLHDL 2.7 06/09/2020   Lab Results  Component Value Date   WBC 7.1 06/09/2020   HGB 14.6 06/09/2020   HCT 42.7 06/09/2020   MCV 87 06/09/2020   PLT 285 06/09/2020   Attestation Statements:   Reviewed by clinician on day of visit: allergies, medications, problem list, medical history, surgical history, family history, social history, and previous encounter notes.  I, Water quality scientist, CMA, am acting as transcriptionist for Briscoe Deutscher, DO  I have reviewed the above documentation for accuracy and completeness, and I agree with the above. Briscoe Deutscher, DO

## 2020-11-07 ENCOUNTER — Ambulatory Visit: Payer: 59

## 2020-11-18 ENCOUNTER — Encounter (INDEPENDENT_AMBULATORY_CARE_PROVIDER_SITE_OTHER): Payer: Self-pay | Admitting: Family Medicine

## 2020-11-19 ENCOUNTER — Telehealth (INDEPENDENT_AMBULATORY_CARE_PROVIDER_SITE_OTHER): Payer: 59 | Admitting: Family Medicine

## 2020-11-19 ENCOUNTER — Encounter (INDEPENDENT_AMBULATORY_CARE_PROVIDER_SITE_OTHER): Payer: Self-pay

## 2020-11-19 ENCOUNTER — Other Ambulatory Visit (INDEPENDENT_AMBULATORY_CARE_PROVIDER_SITE_OTHER): Payer: Self-pay

## 2020-11-19 ENCOUNTER — Other Ambulatory Visit (HOSPITAL_COMMUNITY): Payer: Self-pay

## 2020-11-19 DIAGNOSIS — E559 Vitamin D deficiency, unspecified: Secondary | ICD-10-CM

## 2020-11-19 MED ORDER — VITAMIN D (ERGOCALCIFEROL) 1.25 MG (50000 UNIT) PO CAPS
50000.0000 [IU] | ORAL_CAPSULE | ORAL | 0 refills | Status: DC
  Filled 2020-11-19: qty 4, 28d supply, fill #0

## 2020-11-19 MED FILL — Rosuvastatin Calcium Tab 10 MG: ORAL | 90 days supply | Qty: 90 | Fill #0 | Status: AC

## 2020-11-25 ENCOUNTER — Other Ambulatory Visit (HOSPITAL_COMMUNITY): Payer: Self-pay

## 2020-11-25 MED FILL — Naftifine HCl Gel 2%: CUTANEOUS | 30 days supply | Qty: 45 | Fill #0 | Status: CN

## 2020-11-25 MED FILL — Naftifine HCl Gel 2%: CUTANEOUS | 30 days supply | Qty: 45 | Fill #0 | Status: AC

## 2020-11-26 ENCOUNTER — Other Ambulatory Visit (HOSPITAL_COMMUNITY): Payer: Self-pay

## 2020-12-10 ENCOUNTER — Ambulatory Visit (INDEPENDENT_AMBULATORY_CARE_PROVIDER_SITE_OTHER): Payer: 59 | Admitting: Family Medicine

## 2020-12-10 ENCOUNTER — Other Ambulatory Visit: Payer: Self-pay

## 2020-12-10 ENCOUNTER — Encounter (INDEPENDENT_AMBULATORY_CARE_PROVIDER_SITE_OTHER): Payer: Self-pay | Admitting: Family Medicine

## 2020-12-10 VITALS — BP 134/73 | HR 69 | Temp 98.3°F | Ht 71.0 in | Wt 227.0 lb

## 2020-12-10 DIAGNOSIS — E8881 Metabolic syndrome: Secondary | ICD-10-CM | POA: Diagnosis not present

## 2020-12-10 DIAGNOSIS — E669 Obesity, unspecified: Secondary | ICD-10-CM | POA: Diagnosis not present

## 2020-12-10 DIAGNOSIS — Z6834 Body mass index (BMI) 34.0-34.9, adult: Secondary | ICD-10-CM

## 2020-12-10 DIAGNOSIS — F418 Other specified anxiety disorders: Secondary | ICD-10-CM | POA: Diagnosis not present

## 2020-12-10 DIAGNOSIS — R632 Polyphagia: Secondary | ICD-10-CM | POA: Diagnosis not present

## 2020-12-16 NOTE — Progress Notes (Signed)
Chief Complaint:   OBESITY David Newman is here to discuss his progress with his obesity treatment plan along with follow-up of his obesity related diagnoses.   Today's visit was #: 25  Starting weight: 249 lbs Starting date: 10/16/2019 Today's weight: 227 lbs Today's date: 12/10/2020 Weight change since last visit: +3 lbs Total lbs lost to date: 22 lbs Body mass index is 31.66 kg/m.  Total weight loss percentage to date: -8.84%  Interim History:  David Newman has been tolerating the lower dose of Ozempic well.  He says he has had increased stress lately due to his daughter.  He has recently been on vacation.  Current Meal Plan: keeping a food journal and adhering to recommended goals of 1800-2200 calories and 135 protein for 50% of the time.  Current Exercise Plan: Increased walking. Current Anti-Obesity Medications: Ozempic 0.25 mg subcutaneously weekly. Side effects: None.  Assessment/Plan:   1. Polyphagia Controlled. Current treatment: Ozempic 0.25 mg subcutaneously weekly. Polyphagia refers to excessive feelings of hunger. He will continue to focus on protein-rich, low simple carbohydrate foods. We reviewed the importance of hydration, regular exercise for stress reduction, and restorative sleep.  2. Metabolic syndrome Starting goal met: Lose 7-10% of starting weight. He will continue to focus on protein-rich, low simple carbohydrate foods. We reviewed the importance of hydration, regular exercise for stress reduction, and restorative sleep.  We will continue to check lab work every 3 months, with 10% weight loss, or should any other concerns arise.  3. Situational anxiety Behavior modification techniques were discussed today to help David Newman deal with his anxiety.   4. Obesity, current BMI 31.7  Course: David Newman is currently in the action stage of change. As such, his goal is to continue with weight loss efforts.   Nutrition goals: He has agreed to keeping a food journal and  adhering to recommended goals of 1800-2200 calories and 135 grams of protein.   Exercise goals:  As is.  Behavioral modification strategies: increasing lean protein intake, decreasing simple carbohydrates, and increasing vegetables.  David Newman has agreed to follow-up with our clinic in 4 weeks. He was informed of the importance of frequent follow-up visits to maximize his success with intensive lifestyle modifications for his multiple health conditions.   Objective:   Blood pressure 134/73, pulse 69, temperature 98.3 F (36.8 C), temperature source Oral, height _0  (1.803 m), weight 227 lb (103 kg), SpO2 97 %. Body mass index is 31.66 kg/m.  General: Cooperative, alert, well developed, in no acute distress. HEENT: Conjunctivae and lids unremarkable. Cardiovascular: Regular rhythm.  Lungs: Normal work of breathing. Neurologic: No focal deficits.   Lab Results  Component Value Date   CREATININE 1.27 06/09/2020   BUN 18 06/09/2020   NA 139 06/09/2020   K 4.6 06/09/2020   CL 103 06/09/2020   CO2 23 06/09/2020   Lab Results  Component Value Date   ALT 19 06/09/2020   AST 22 06/09/2020   ALKPHOS 90 06/09/2020   BILITOT 0.3 06/09/2020   Lab Results  Component Value Date   HGBA1C 5.5 01/23/2020   Lab Results  Component Value Date   INSULIN 11.0 06/09/2020   Lab Results  Component Value Date   TSH 1.740 10/16/2019   Lab Results  Component Value Date   CHOL 126 06/09/2020   HDL 47 06/09/2020   LDLCALC 64 06/09/2020   TRIG 77 06/09/2020   CHOLHDL 2.7 06/09/2020   Lab Results  Component Value Date   VD25OH 52.1  06/09/2020   VD25OH 45.7 01/23/2020   VD25OH 16.4 (L) 10/16/2019   Lab Results  Component Value Date   WBC 7.1 06/09/2020   HGB 14.6 06/09/2020   HCT 42.7 06/09/2020   MCV 87 06/09/2020   PLT 285 06/09/2020   Attestation Statements:   Reviewed by clinician on day of visit: allergies, medications, problem list, medical history, surgical history,  family history, social history, and previous encounter notes.  I, Water quality scientist, CMA, am acting as transcriptionist for Briscoe Deutscher, DO  I have reviewed the above documentation for accuracy and completeness, and I agree with the above. Briscoe Deutscher, DO

## 2020-12-18 MED FILL — Mesalamine Tab Delayed Release 1.2 GM: ORAL | 90 days supply | Qty: 360 | Fill #1 | Status: CN

## 2020-12-19 ENCOUNTER — Other Ambulatory Visit (HOSPITAL_COMMUNITY): Payer: Self-pay

## 2020-12-19 MED FILL — Mesalamine Tab Delayed Release 1.2 GM: ORAL | 30 days supply | Qty: 120 | Fill #1 | Status: AC

## 2020-12-29 ENCOUNTER — Other Ambulatory Visit (HOSPITAL_COMMUNITY): Payer: Self-pay

## 2020-12-31 ENCOUNTER — Other Ambulatory Visit: Payer: Self-pay

## 2020-12-31 ENCOUNTER — Encounter (INDEPENDENT_AMBULATORY_CARE_PROVIDER_SITE_OTHER): Payer: Self-pay | Admitting: Family Medicine

## 2020-12-31 ENCOUNTER — Ambulatory Visit (INDEPENDENT_AMBULATORY_CARE_PROVIDER_SITE_OTHER): Payer: 59 | Admitting: Family Medicine

## 2020-12-31 VITALS — BP 127/79 | HR 70 | Temp 98.0°F | Ht 71.0 in | Wt 228.0 lb

## 2020-12-31 DIAGNOSIS — Z9189 Other specified personal risk factors, not elsewhere classified: Secondary | ICD-10-CM | POA: Diagnosis not present

## 2020-12-31 DIAGNOSIS — Z6834 Body mass index (BMI) 34.0-34.9, adult: Secondary | ICD-10-CM

## 2020-12-31 DIAGNOSIS — R632 Polyphagia: Secondary | ICD-10-CM | POA: Diagnosis not present

## 2020-12-31 DIAGNOSIS — E8881 Metabolic syndrome: Secondary | ICD-10-CM | POA: Diagnosis not present

## 2020-12-31 DIAGNOSIS — E669 Obesity, unspecified: Secondary | ICD-10-CM

## 2020-12-31 DIAGNOSIS — E559 Vitamin D deficiency, unspecified: Secondary | ICD-10-CM | POA: Diagnosis not present

## 2021-01-01 ENCOUNTER — Other Ambulatory Visit (HOSPITAL_COMMUNITY): Payer: Self-pay

## 2021-01-01 MED ORDER — VITAMIN D (ERGOCALCIFEROL) 1.25 MG (50000 UNIT) PO CAPS
50000.0000 [IU] | ORAL_CAPSULE | ORAL | 0 refills | Status: DC
  Filled 2021-01-01: qty 4, 28d supply, fill #0

## 2021-01-05 NOTE — Progress Notes (Signed)
Chief Complaint:   OBESITY David Newman is here to discuss his progress with his obesity treatment plan along with follow-up of his obesity related diagnoses.   Today's visit was #: 65 Starting weight: 249 lbs Starting date: 10/16/2019 Today's weight: 228 lbs Today's date: 12/31/2020 Weight change since last visit: +1 lb Total lbs lost to date: 21 lbs Body mass index is 31.8 kg/m.  Total weight loss percentage to date: -8.43%  Current Meal Plan: keeping a food journal and adhering to recommended goals of 1800-2200 calories and 135 grams of protein for 50% of the time.  Current Exercise Plan: Walking/cardio/strength training for 30-40 minutes 5 times per week.  Interim History:  David Newman's body fat percentage is 29.2 (down from last visit).  He had labs done 6 months ago.  He says he was at the beach last week.  He says he is now, "back on the path of righteousness".  Plan:  Check labs at next visit.  Assessment/Plan:   1. Vitamin D deficiency At goal.  She is taking vitamin D 50,000 IU weekly.  Plan: Continue to take prescription Vitamin D @50 ,000 IU every week as prescribed.  Follow-up for routine testing of Vitamin D, at least 2-3 times per year to avoid over-replacement.  Lab Results  Component Value Date   VD25OH 52.1 06/09/2020   VD25OH 45.7 01/23/2020   VD25OH 16.4 (L) 10/16/2019   - Refill Vitamin D, Ergocalciferol, (DRISDOL) 1.25 MG (50000 UNIT) CAPS capsule; Take 1 capsule (50,000 Units total) by mouth every 7 (seven) days.  Dispense: 4 capsule; Refill: 0  2. Metabolic syndrome Starting goal MET: Lose 7-10% of starting weight. He will continue to focus on protein-rich, low simple carbohydrate foods. We reviewed the importance of hydration, regular exercise for stress reduction, and restorative sleep.  We will continue to check lab work every 3 months, with 10% weight loss, or should any other concerns arise.  3. Polyphagia Controlled. Current treatment: None. He  will continue to focus on protein-rich, low simple carbohydrate foods. We reviewed the importance of hydration, regular exercise for stress reduction, and restorative sleep.  4. At risk for heart disease Due to David Newman current state of health and medical condition(s), he is at a higher risk for heart disease.  This puts the patient at much greater risk to subsequently develop cardiopulmonary conditions that can significantly affect patient's quality of life in a negative manner.    At least 8 minutes were spent on counseling David Newman about these concerns today. Evidence-based interventions for health behavior change were utilized today including the discussion of self monitoring techniques, problem-solving barriers, and SMART goal setting techniques.  Specifically, regarding patient's less desirable eating habits and patterns, we employed the technique of small changes when David Newman has not been able to fully commit to his prudent nutritional plan.  5. Obesity, current BMI 31.8  Course: David Newman is currently in the action stage of change. As such, his goal is to continue with weight loss efforts.   Nutrition goals: He has agreed to keeping a food journal and adhering to recommended goals of 1800-2200 calories and 135 grams of protein.   Exercise goals:  As is.  Behavioral modification strategies: increasing water intake, decreasing alcohol intake, and emotional eating strategies.  David Newman has agreed to follow-up with our clinic in 4 weeks. He was informed of the importance of frequent follow-up visits to maximize his success with intensive lifestyle modifications for his multiple health conditions.   Objective:  Blood pressure 127/79, pulse 70, temperature 98 F (36.7 C), temperature source Oral, height 5' 11"  (1.803 m), weight 228 lb (103.4 kg), SpO2 96 %. Body mass index is 31.8 kg/m.  General: Cooperative, alert, well developed, in no acute distress. HEENT: Conjunctivae and lids  unremarkable. Cardiovascular: Regular rhythm.  Lungs: Normal work of breathing. Neurologic: No focal deficits.   Lab Results  Component Value Date   CREATININE 1.27 06/09/2020   BUN 18 06/09/2020   NA 139 06/09/2020   K 4.6 06/09/2020   CL 103 06/09/2020   CO2 23 06/09/2020   Lab Results  Component Value Date   ALT 19 06/09/2020   AST 22 06/09/2020   ALKPHOS 90 06/09/2020   BILITOT 0.3 06/09/2020   Lab Results  Component Value Date   HGBA1C 5.5 01/23/2020   Lab Results  Component Value Date   INSULIN 11.0 06/09/2020   Lab Results  Component Value Date   TSH 1.740 10/16/2019   Lab Results  Component Value Date   CHOL 126 06/09/2020   HDL 47 06/09/2020   LDLCALC 64 06/09/2020   TRIG 77 06/09/2020   CHOLHDL 2.7 06/09/2020   Lab Results  Component Value Date   VD25OH 52.1 06/09/2020   VD25OH 45.7 01/23/2020   VD25OH 16.4 (L) 10/16/2019   Lab Results  Component Value Date   WBC 7.1 06/09/2020   HGB 14.6 06/09/2020   HCT 42.7 06/09/2020   MCV 87 06/09/2020   PLT 285 06/09/2020   Attestation Statements:   Reviewed by clinician on day of visit: allergies, medications, problem list, medical history, surgical history, family history, social history, and previous encounter notes.  I, Water quality scientist, CMA, am acting as transcriptionist for Briscoe Deutscher, DO  I have reviewed the above documentation for accuracy and completeness, and I agree with the above. Briscoe Deutscher, DO

## 2021-01-29 ENCOUNTER — Ambulatory Visit (INDEPENDENT_AMBULATORY_CARE_PROVIDER_SITE_OTHER): Payer: 59 | Admitting: Family Medicine

## 2021-01-29 ENCOUNTER — Other Ambulatory Visit: Payer: Self-pay

## 2021-01-29 ENCOUNTER — Encounter (INDEPENDENT_AMBULATORY_CARE_PROVIDER_SITE_OTHER): Payer: Self-pay | Admitting: Family Medicine

## 2021-01-29 ENCOUNTER — Other Ambulatory Visit (HOSPITAL_COMMUNITY): Payer: Self-pay

## 2021-01-29 VITALS — BP 135/82 | HR 66 | Temp 98.0°F | Ht 71.0 in | Wt 230.0 lb

## 2021-01-29 DIAGNOSIS — F439 Reaction to severe stress, unspecified: Secondary | ICD-10-CM | POA: Diagnosis not present

## 2021-01-29 DIAGNOSIS — R7303 Prediabetes: Secondary | ICD-10-CM | POA: Diagnosis not present

## 2021-01-29 DIAGNOSIS — Z9189 Other specified personal risk factors, not elsewhere classified: Secondary | ICD-10-CM

## 2021-01-29 DIAGNOSIS — E669 Obesity, unspecified: Secondary | ICD-10-CM | POA: Diagnosis not present

## 2021-01-29 DIAGNOSIS — Z6834 Body mass index (BMI) 34.0-34.9, adult: Secondary | ICD-10-CM

## 2021-01-29 DIAGNOSIS — E782 Mixed hyperlipidemia: Secondary | ICD-10-CM | POA: Diagnosis not present

## 2021-01-29 DIAGNOSIS — E559 Vitamin D deficiency, unspecified: Secondary | ICD-10-CM | POA: Diagnosis not present

## 2021-01-29 DIAGNOSIS — E538 Deficiency of other specified B group vitamins: Secondary | ICD-10-CM | POA: Diagnosis not present

## 2021-01-29 MED ORDER — VITAMIN D (ERGOCALCIFEROL) 1.25 MG (50000 UNIT) PO CAPS
50000.0000 [IU] | ORAL_CAPSULE | ORAL | 0 refills | Status: DC
  Filled 2021-01-29: qty 4, 28d supply, fill #0

## 2021-01-29 MED ORDER — CYANOCOBALAMIN 1000 MCG/ML IJ SOLN
1000.0000 ug | INTRAMUSCULAR | 15 refills | Status: DC
  Filled 2021-01-29: qty 4, 28d supply, fill #0

## 2021-01-29 MED ORDER — TIRZEPATIDE 2.5 MG/0.5ML ~~LOC~~ SOAJ
2.5000 mg | SUBCUTANEOUS | 0 refills | Status: DC
  Filled 2021-01-29: qty 2, 28d supply, fill #0

## 2021-01-29 MED ORDER — "SYRINGE 25G X 1"" 3 ML MISC"
0 refills | Status: DC
  Filled 2021-01-29: qty 50, 50d supply, fill #0

## 2021-01-29 MED FILL — Mesalamine Tab Delayed Release 1.2 GM: ORAL | 90 days supply | Qty: 360 | Fill #2 | Status: AC

## 2021-01-29 NOTE — Progress Notes (Signed)
Chief Complaint:   OBESITY David Newman is here to discuss his progress with his obesity treatment plan along with follow-up of his obesity related diagnoses.   Today's visit was #: 37 Starting weight: 249 lbs Starting date: 10/16/2019 Today's weight: 230 lbs Today's date: 01/29/2021 Weight change since last visit: +2 lbs Total lbs lost to date: 19 lbs Body mass index is 32.08 kg/m.  Total weight loss percentage to date: -7.63%  Current Meal Plan: keeping a food journal and adhering to recommended goals of 1800-2200 calories and 135 grams of protein for 70-80% of the time.  Current Exercise Plan: Walking/cardio/strength training for 30-40 minutes 2-3 times per week.  Interim History:  David Newman says he has had increased stress with is daughter and with work.  He says he has less motivation and has not been exercising.  He reports drinking more alcohol - celebratory.  He says he is tolerating Ozempic 0.25 mg subcutaneously weekly.  Assessment/Plan:   1. Prediabetes, with polyphagia Not at goal. Goal is HgbA1c < 5.7.  Medication: Ozempic 0.25 mg subcutaneously weekly.    Plan:  Stop Ozempic and start Mounjaro 2.5 mg subcutaneously weekly.  He will continue to focus on protein-rich, low simple carbohydrate foods. We reviewed the importance of hydration, regular exercise for stress reduction, and restorative sleep.  Will check labs today.  Lab Results  Component Value Date   HGBA1C 5.5 01/23/2020   Lab Results  Component Value Date   INSULIN 11.0 06/09/2020   - CBC with Differential/Platelet - Comprehensive metabolic panel - Insulin, random - Hemoglobin A1c - Start tirzepatide [facility]) 2.5 MG/0.5ML Pen; Inject 2.5 mg into the skin once a week.  Dispense: 2 mL; Refill: 0  2. B12 deficiency Lab Results  Component Value Date   VITAMINB12 652 06/09/2020   Supplementation:  Vitamin B12 injection monthly.   Plan:  Refill vitamin B12 at current dose today.  Also refill  supplies.  Will check vitamin B12 level today.  - Refill cyanocobalamin (,VITAMIN B-12,) 1000 MCG/ML injection; Inject 1 mL (1,000 mcg total) into the skin once a week for 4 weeks then 1 mL every 30 (thirty) days.  Dispense: 1 mL; Refill: 15 - Refill Syringe/Needle, Disp, (SYRINGE 3CC/25GX1") 25G X 1" 3 ML MISC; For B12 injections.  Dispense: 50 each; Refill: 0 - Vitamin B12  3. Vitamin D deficiency At goal.  He is taking vitamin D 50,000 IU weekly.  Plan: Continue to take prescription Vitamin D @50 ,000 IU every week as prescribed.  Follow-up for routine testing of Vitamin D, at least 2-3 times per year to avoid over-replacement.  Will check vitamin D level today.  Lab Results  Component Value Date   VD25OH 52.1 06/09/2020   VD25OH 45.7 01/23/2020   VD25OH 16.4 (L) 10/16/2019   - Refill Vitamin D, Ergocalciferol, (DRISDOL) 1.25 MG (50000 UNIT) CAPS capsule; Take 1 capsule (50,000 Units total) by mouth every 7 (seven) days.  Dispense: 4 capsule; Refill: 0 - VITAMIN D 25 Hydroxy (Vit-D Deficiency, Fractures)  4. Mixed hyperlipidemia Course: At goal. Lipid-lowering medications: Crestor 10 mg daily.   Plan: Dietary changes: Increase soluble fiber, decrease simple carbohydrates, decrease saturated fat. Exercise changes: Moderate to vigorous-intensity aerobic activity 150 minutes per week or as tolerated. We will continue to monitor along with PCP/specialists as it pertains to his weight loss journey.  Will check labs today.  Lab Results  Component Value Date   CHOL 126 06/09/2020   HDL 47 06/09/2020  LDLCALC 64 06/09/2020   TRIG 77 06/09/2020   CHOLHDL 2.7 06/09/2020   Lab Results  Component Value Date   ALT 19 06/09/2020   AST 22 06/09/2020   ALKPHOS 90 06/09/2020   BILITOT 0.3 06/09/2020   - Lipid panel - Lipoprotein A (LPA)  5. Situational stress David Newman reports being under increased stress recently due to his daughter and work stress.  6. At risk for heart disease Due  to [place] current state of health and medical condition(s), he is at a higher risk for heart disease.  This puts the patient at much greater risk to subsequently develop cardiopulmonary conditions that can significantly affect patient's quality of life in a negative manner.    At least 8 minutes were spent on counseling David Newman about these concerns today. Evidence-based interventions for health behavior change were utilized today including the discussion of self monitoring techniques, problem-solving barriers, and SMART goal setting techniques.  Specifically, regarding patient's less desirable eating habits and patterns, we employed the technique of small changes when David Newman has not been able to fully commit to his prudent nutritional plan.  7. Obesity, current BMI 32.1  Course: David Newman is currently in the action stage of change. As such, his goal is to continue with weight loss efforts.   Nutrition goals: He has agreed to keeping a food journal and adhering to recommended goals of 1800-2200 calories and 135 grams of protein.   Exercise goals:  As is.  Behavioral modification strategies: increasing lean protein intake, decreasing simple carbohydrates, increasing vegetables, increasing water intake, decreasing liquid calories, decreasing alcohol intake, decreasing sodium intake, and increasing high fiber foods.  David Newman has agreed to follow-up with our clinic in 4 weeks. He was informed of the importance of frequent follow-up visits to maximize his success with intensive lifestyle modifications for his multiple health conditions.   Objective:   Blood pressure 135/82, pulse 66, temperature 98 F (36.7 C), temperature source Oral, height 5' 11"  (1.803 m), weight 230 lb (104.3 kg), SpO2 98 %. Body mass index is 32.08 kg/m.  General: Cooperative, alert, well developed, in no acute distress. HEENT: Conjunctivae and lids unremarkable. Cardiovascular: Regular rhythm.  Lungs: Normal work of  breathing. Neurologic: No focal deficits.   Lab Results  Component Value Date   CREATININE 1.27 06/09/2020   BUN 18 06/09/2020   NA 139 06/09/2020   K 4.6 06/09/2020   CL 103 06/09/2020   CO2 23 06/09/2020   Lab Results  Component Value Date   ALT 19 06/09/2020   AST 22 06/09/2020   ALKPHOS 90 06/09/2020   BILITOT 0.3 06/09/2020   Lab Results  Component Value Date   HGBA1C 5.5 01/23/2020   Lab Results  Component Value Date   INSULIN 11.0 06/09/2020   Lab Results  Component Value Date   TSH 1.740 10/16/2019   Lab Results  Component Value Date   CHOL 126 06/09/2020   HDL 47 06/09/2020   LDLCALC 64 06/09/2020   TRIG 77 06/09/2020   CHOLHDL 2.7 06/09/2020   Lab Results  Component Value Date   VD25OH 52.1 06/09/2020   VD25OH 45.7 01/23/2020   VD25OH 16.4 (L) 10/16/2019   Lab Results  Component Value Date   WBC 7.1 06/09/2020   HGB 14.6 06/09/2020   HCT 42.7 06/09/2020   MCV 87 06/09/2020   PLT 285 06/09/2020   Attestation Statements:   Reviewed by clinician on day of visit: allergies, medications, problem list, medical history, surgical history, family history,  social history, and previous encounter notes.  I, Water quality scientist, CMA, am acting as transcriptionist for Briscoe Deutscher, DO  I have reviewed the above documentation for accuracy and completeness, and I agree with the above. Briscoe Deutscher, DO

## 2021-01-30 LAB — CBC WITH DIFFERENTIAL/PLATELET
Basophils Absolute: 0.1 10*3/uL (ref 0.0–0.2)
Basos: 1 %
EOS (ABSOLUTE): 0.3 10*3/uL (ref 0.0–0.4)
Eos: 5 %
Hematocrit: 43.5 % (ref 37.5–51.0)
Hemoglobin: 15.1 g/dL (ref 13.0–17.7)
Immature Grans (Abs): 0.1 10*3/uL (ref 0.0–0.1)
Immature Granulocytes: 1 %
Lymphocytes Absolute: 1.3 10*3/uL (ref 0.7–3.1)
Lymphs: 22 %
MCH: 30.4 pg (ref 26.6–33.0)
MCHC: 34.7 g/dL (ref 31.5–35.7)
MCV: 88 fL (ref 79–97)
Monocytes Absolute: 0.6 10*3/uL (ref 0.1–0.9)
Monocytes: 11 %
Neutrophils Absolute: 3.5 10*3/uL (ref 1.4–7.0)
Neutrophils: 60 %
Platelets: 232 10*3/uL (ref 150–450)
RBC: 4.96 x10E6/uL (ref 4.14–5.80)
RDW: 12.7 % (ref 11.6–15.4)
WBC: 5.7 10*3/uL (ref 3.4–10.8)

## 2021-01-30 LAB — LIPID PANEL
Chol/HDL Ratio: 3.1 ratio (ref 0.0–5.0)
Cholesterol, Total: 173 mg/dL (ref 100–199)
HDL: 56 mg/dL (ref >39)
LDL Chol Calc (NIH): 92 mg/dL (ref 0–99)
Triglycerides: 141 mg/dL (ref 0–149)
VLDL Cholesterol Cal: 25 mg/dL (ref 5–40)

## 2021-01-30 LAB — COMPREHENSIVE METABOLIC PANEL
ALT: 29 IU/L (ref 0–44)
AST: 25 IU/L (ref 0–40)
Albumin/Globulin Ratio: 2 (ref 1.2–2.2)
Albumin: 4.8 g/dL (ref 3.8–4.9)
Alkaline Phosphatase: 85 IU/L (ref 44–121)
BUN/Creatinine Ratio: 18 (ref 9–20)
BUN: 19 mg/dL (ref 6–24)
Bilirubin Total: 0.5 mg/dL (ref 0.0–1.2)
CO2: 22 mmol/L (ref 20–29)
Calcium: 9.7 mg/dL (ref 8.7–10.2)
Chloride: 103 mmol/L (ref 96–106)
Creatinine, Ser: 1.06 mg/dL (ref 0.76–1.27)
Globulin, Total: 2.4 g/dL (ref 1.5–4.5)
Glucose: 104 mg/dL — ABNORMAL HIGH (ref 65–99)
Potassium: 4.4 mmol/L (ref 3.5–5.2)
Sodium: 139 mmol/L (ref 134–144)
Total Protein: 7.2 g/dL (ref 6.0–8.5)
eGFR: 81 mL/min/{1.73_m2} (ref >59)

## 2021-01-30 LAB — INSULIN, RANDOM: INSULIN: 18.1 u[IU]/mL (ref 2.6–24.9)

## 2021-01-30 LAB — LIPOPROTEIN A (LPA): Lipoprotein (a): 238.6 nmol/L — ABNORMAL HIGH (ref <75.0)

## 2021-01-30 LAB — HEMOGLOBIN A1C
Est. average glucose Bld gHb Est-mCnc: 111 mg/dL
Hgb A1c MFr Bld: 5.5 % (ref 4.8–5.6)

## 2021-01-30 LAB — VITAMIN B12: Vitamin B-12: 770 pg/mL (ref 232–1245)

## 2021-01-30 LAB — VITAMIN D 25 HYDROXY (VIT D DEFICIENCY, FRACTURES): Vit D, 25-Hydroxy: 76.3 ng/mL (ref 30.0–100.0)

## 2021-02-15 ENCOUNTER — Other Ambulatory Visit (HOSPITAL_COMMUNITY): Payer: Self-pay

## 2021-02-16 ENCOUNTER — Ambulatory Visit (INDEPENDENT_AMBULATORY_CARE_PROVIDER_SITE_OTHER): Payer: 59 | Admitting: Family Medicine

## 2021-02-16 ENCOUNTER — Other Ambulatory Visit: Payer: Self-pay

## 2021-02-16 ENCOUNTER — Other Ambulatory Visit (HOSPITAL_COMMUNITY): Payer: Self-pay

## 2021-02-16 ENCOUNTER — Encounter (INDEPENDENT_AMBULATORY_CARE_PROVIDER_SITE_OTHER): Payer: Self-pay | Admitting: Family Medicine

## 2021-02-16 VITALS — BP 130/81 | HR 68 | Temp 98.2°F | Ht 71.0 in | Wt 232.0 lb

## 2021-02-16 DIAGNOSIS — R7303 Prediabetes: Secondary | ICD-10-CM

## 2021-02-16 DIAGNOSIS — Z6834 Body mass index (BMI) 34.0-34.9, adult: Secondary | ICD-10-CM

## 2021-02-16 DIAGNOSIS — E669 Obesity, unspecified: Secondary | ICD-10-CM | POA: Diagnosis not present

## 2021-02-16 DIAGNOSIS — E559 Vitamin D deficiency, unspecified: Secondary | ICD-10-CM | POA: Diagnosis not present

## 2021-02-16 MED ORDER — TIRZEPATIDE 2.5 MG/0.5ML ~~LOC~~ SOAJ
2.5000 mg | SUBCUTANEOUS | 0 refills | Status: DC
Start: 2021-02-16 — End: 2021-02-16
  Filled 2021-02-16: qty 2, 28d supply, fill #0

## 2021-02-16 MED ORDER — VITAMIN D (ERGOCALCIFEROL) 1.25 MG (50000 UNIT) PO CAPS
50000.0000 [IU] | ORAL_CAPSULE | ORAL | 0 refills | Status: DC
  Filled 2021-02-16 – 2021-03-13 (×2): qty 4, 28d supply, fill #0

## 2021-02-16 MED ORDER — ROSUVASTATIN CALCIUM 10 MG PO TABS
10.0000 mg | ORAL_TABLET | Freq: Every day | ORAL | 0 refills | Status: DC
  Filled 2021-02-16: qty 90, 90d supply, fill #0

## 2021-02-16 MED ORDER — TIRZEPATIDE 5 MG/0.5ML ~~LOC~~ SOAJ
5.0000 mg | SUBCUTANEOUS | 0 refills | Status: DC
  Filled 2021-02-16: qty 2, 28d supply, fill #0

## 2021-02-16 NOTE — Progress Notes (Signed)
Chief Complaint:   OBESITY David Newman is here to discuss his progress with his obesity treatment plan along with follow-up of his obesity related diagnoses. See Medical Weight Management Flowsheet for complete bioelectrical impedance results.  Today's visit was #: 28 Starting weight: 249 lbs Starting date: 10/16/2019 Weight change since last visit: +2 lbs Total lbs lost to date: 17 lbs Total weight loss percentage to date: -6.83%  Nutrition Plan: Keeping a food journal and adhering to recommended goals of 1800-2200 calories and 135 grams of protein daily for 50% of the time. Activity: Walking 1-1.5 miles/strength training for 30-40 minutes 1-3 times per week. Anti-obesity medications: Mounjaro 2.5 mg subcutaneously weekly. Reported side effects: None.  Interim History: David Newman had his 3rd dose of Mounjaro 2.5 mg today.  He has been drinking more ETOH recently.  He is still not exercising.  He endorses situational stress.  Assessment/Plan:   1. Vitamin D deficiency At goal.  He is taking vitamin D 50,000 IU weekly.  Plan: Decrease prescription Vitamin D @50 ,000 IU to every 2 weeks.  Follow-up for routine testing of Vitamin D, at least 2-3 times per year to avoid over-replacement.  Lab Results  Component Value Date   VD25OH 76.3 01/29/2021   VD25OH 52.1 06/09/2020   VD25OH 45.7 01/23/2020   - Decrease Vitamin D, Ergocalciferol, (DRISDOL) 1.25 MG (50000 UNIT) CAPS capsule; Take 1 capsule (50,000 Units total) by mouth every 14 days.  Dispense: 4 capsule; Refill: 0  2. Prediabetes, with polyphagia Not at goal. Goal is HgbA1c < 5.7.  Medication: Mounjaro 2.5 mg subcutaneously weekly.    Plan:  Increase Mounjaro to 5 mg subcutaneously weekly.  He will continue to focus on protein-rich, low simple carbohydrate foods. We reviewed the importance of hydration, regular exercise for stress reduction, and restorative sleep.   Lab Results  Component Value Date   HGBA1C 5.5 01/29/2021    Lab Results  Component Value Date   INSULIN 18.1 01/29/2021   INSULIN 11.0 06/09/2020   -  Increase tirzepatide (MOUNJARO) 5 MG/0.5ML Pen; Inject 5 mg into the skin once a week.  Dispense: 2 mL; Refill: 0  3. Obesity, current BMI 32.4  Course: David Newman is currently in the action stage of change. As such, his goal is to continue with weight loss efforts.   Nutrition goals: He has agreed to keeping a food journal and adhering to recommended goals of 1800-2200 calories and 138 grams of protein.   Exercise goals:  As is.  Behavioral modification strategies: increasing lean protein intake, decreasing simple carbohydrates, increasing vegetables, increasing water intake, decreasing alcohol intake, and emotional eating strategies.  David Newman has agreed to follow-up with our clinic in 3 weeks. He was informed of the importance of frequent follow-up visits to maximize his success with intensive lifestyle modifications for his multiple health conditions.   Objective:   Blood pressure 130/81, pulse 68, temperature 98.2 F (36.8 C), temperature source Oral, height 5' 11"  (1.803 m), weight 232 lb (105.2 kg), SpO2 96 %. Body mass index is 32.36 kg/m.  General: Cooperative, alert, well developed, in no acute distress. HEENT: Conjunctivae and lids unremarkable. Cardiovascular: Regular rhythm.  Lungs: Normal work of breathing. Neurologic: No focal deficits.   Lab Results  Component Value Date   CREATININE 1.06 01/29/2021   BUN 19 01/29/2021   NA 139 01/29/2021   K 4.4 01/29/2021   CL 103 01/29/2021   CO2 22 01/29/2021   Lab Results  Component Value Date  ALT 29 01/29/2021   AST 25 01/29/2021   ALKPHOS 85 01/29/2021   BILITOT 0.5 01/29/2021   Lab Results  Component Value Date   HGBA1C 5.5 01/29/2021   HGBA1C 5.5 01/23/2020   Lab Results  Component Value Date   INSULIN 18.1 01/29/2021   INSULIN 11.0 06/09/2020   Lab Results  Component Value Date   TSH 1.740 10/16/2019    Lab Results  Component Value Date   CHOL 173 01/29/2021   HDL 56 01/29/2021   LDLCALC 92 01/29/2021   TRIG 141 01/29/2021   CHOLHDL 3.1 01/29/2021   Lab Results  Component Value Date   VD25OH 76.3 01/29/2021   VD25OH 52.1 06/09/2020   VD25OH 45.7 01/23/2020   Lab Results  Component Value Date   WBC 5.7 01/29/2021   HGB 15.1 01/29/2021   HCT 43.5 01/29/2021   MCV 88 01/29/2021   PLT 232 01/29/2021   Attestation Statements:   Reviewed by clinician on day of visit: allergies, medications, problem list, medical history, surgical history, family history, social history, and previous encounter notes.  I, Water quality scientist, CMA, am acting as transcriptionist for Briscoe Deutscher, DO  I have reviewed the above documentation for accuracy and completeness, and I agree with the above. Briscoe Deutscher, DO

## 2021-03-04 ENCOUNTER — Other Ambulatory Visit (HOSPITAL_COMMUNITY): Payer: Self-pay

## 2021-03-08 ENCOUNTER — Encounter (INDEPENDENT_AMBULATORY_CARE_PROVIDER_SITE_OTHER): Payer: Self-pay

## 2021-03-09 ENCOUNTER — Encounter (INDEPENDENT_AMBULATORY_CARE_PROVIDER_SITE_OTHER): Payer: Self-pay

## 2021-03-09 ENCOUNTER — Ambulatory Visit (INDEPENDENT_AMBULATORY_CARE_PROVIDER_SITE_OTHER): Payer: 59 | Admitting: Family Medicine

## 2021-03-13 ENCOUNTER — Other Ambulatory Visit (HOSPITAL_COMMUNITY): Payer: Self-pay

## 2021-03-20 ENCOUNTER — Ambulatory Visit: Payer: 59 | Attending: Internal Medicine

## 2021-03-20 DIAGNOSIS — Z23 Encounter for immunization: Secondary | ICD-10-CM

## 2021-03-20 NOTE — Progress Notes (Signed)
   Covid-19 Vaccination Clinic  Name:  David Newman    MRN: [id number] DOB: Aug 07, 1960  03/20/2021  [name] was observed post Covid-19 immunization for 15 minutes without incident. He was provided with Vaccine Information Sheet and instruction to access the V-Safe system.   [name] was instructed to call 911 with any severe reactions post vaccine: Difficulty breathing  Swelling of face and throat  A fast heartbeat  A bad rash all over body  Dizziness and weakness   Immunizations Administered     Name Date Dose VIS Date Route   Pfizer Covid-19 Vaccine Bivalent Booster 03/20/2021  9:08 AM 0.3 mL 01/14/2021 Intramuscular   Manufacturer: [place]   Lot: [id number]   [place]: [phone]

## 2021-03-31 ENCOUNTER — Other Ambulatory Visit: Payer: Self-pay

## 2021-03-31 ENCOUNTER — Encounter (INDEPENDENT_AMBULATORY_CARE_PROVIDER_SITE_OTHER): Payer: Self-pay | Admitting: Family Medicine

## 2021-03-31 ENCOUNTER — Other Ambulatory Visit (HOSPITAL_COMMUNITY): Payer: Self-pay

## 2021-03-31 ENCOUNTER — Ambulatory Visit (INDEPENDENT_AMBULATORY_CARE_PROVIDER_SITE_OTHER): Payer: 59 | Admitting: Family Medicine

## 2021-03-31 VITALS — BP 149/82 | HR 64 | Temp 97.0°F | Ht 71.0 in | Wt 234.0 lb

## 2021-03-31 DIAGNOSIS — F439 Reaction to severe stress, unspecified: Secondary | ICD-10-CM | POA: Diagnosis not present

## 2021-03-31 DIAGNOSIS — R7303 Prediabetes: Secondary | ICD-10-CM | POA: Diagnosis not present

## 2021-03-31 DIAGNOSIS — E65 Localized adiposity: Secondary | ICD-10-CM

## 2021-03-31 DIAGNOSIS — Z6834 Body mass index (BMI) 34.0-34.9, adult: Secondary | ICD-10-CM | POA: Diagnosis not present

## 2021-03-31 DIAGNOSIS — E669 Obesity, unspecified: Secondary | ICD-10-CM

## 2021-03-31 MED ORDER — TIRZEPATIDE 7.5 MG/0.5ML ~~LOC~~ SOAJ
7.5000 mg | SUBCUTANEOUS | 1 refills | Status: DC
  Filled 2021-03-31: qty 2, 28d supply, fill #0

## 2021-03-31 NOTE — Progress Notes (Signed)
Chief Complaint:   OBESITY David Newman is here to discuss his progress with his obesity treatment plan along with follow-up of his obesity related diagnoses. See Medical Weight Management Flowsheet for complete bioelectrical impedance results.  Today's visit was #: 61 Starting weight: 249 lbs Starting date: 10/16/2019 Weight change since last visit: +2 lbs Total lbs lost to date: 15 lbs Total weight loss percentage to date: -6.02%  Nutrition Plan: Keeping a food journal and adhering to recommended goals of 1800-2200 calories and 138 grams of protein daily for 50% of the time. Activity: Cardio/weights for 40 minutes 3 times per week.  Anti-obesity medications: Mounjaro 5 mg subcutaneously weekly. Reported side effects: None.  Interim History: David Newman is tolerating Mounjaro without side effects.  No constipation.  He has not had his flu shot, and so far, no UC flare.  He says that work and home are very stressful.  He reports that it is hard to get motivated to the meal plan and exercise.  Assessment/Plan:   1. Prediabetes, with polyphagia At goal. Goal is HgbA1c < 5.7.  Medication: Mounjaro 5 mg subcutaneously weekly.    Plan:  Increase Mounjaro to 7.5 mg subcutaneously weekly.  New prescription sent to pharmacy today.  He will continue to focus on protein-rich, low simple carbohydrate foods. We reviewed the importance of hydration, regular exercise for stress reduction, and restorative sleep.   Lab Results  Component Value Date   HGBA1C 5.5 01/29/2021   Lab Results  Component Value Date   INSULIN 18.1 01/29/2021   INSULIN 11.0 06/09/2020   2. Visceral obesity Current visceral fat rating: 18. Visceral fat rating goal is < 13. Visceral adipose tissue is a hormonally active component of total body fat. This body composition phenotype is associated with medical disorders such as metabolic syndrome, cardiovascular disease, and several malignancies including prostate, breast, and  colorectal cancers. Starting goal: Lose 7-10% of starting weight.   3. Situational stress David Newman reports being under increased stress recently at work and at home. Motivational interviewing as well as evidence-based interventions for health behavior change were utilized today including the discussion of self monitoring techniques, problem-solving barriers and SMART goal setting techniques.  Specifically regarding patient's less desirable eating habits and patterns, we employed the technique of small changes when he cannot fully commit to his prudent nutritional plan.  4. Obesity, current BMI 32.7  Course: David Newman is currently in the action stage of change. As such, his goal is to continue with weight loss efforts.   Nutrition goals: He has agreed to keeping a food journal and adhering to recommended goals of 1800-2200 calories and 135 grams of protein.   Exercise goals:  As is.  Behavioral modification strategies: increasing lean protein intake, decreasing simple carbohydrates, increasing vegetables, increasing water intake, and emotional eating strategies.  David Newman has agreed to follow-up with our clinic in 4 weeks. He was informed of the importance of frequent follow-up visits to maximize his success with intensive lifestyle modifications for his multiple health conditions.   Objective:   Blood pressure (!) 149/82, pulse 64, temperature (!) 97 F (36.1 C), height 5' 11"  (1.803 m), weight 234 lb (106.1 kg), SpO2 97 %. Body mass index is 32.64 kg/m.  General: Cooperative, alert, well developed, in no acute distress. HEENT: Conjunctivae and lids unremarkable. Cardiovascular: Regular rhythm.  Lungs: Normal work of breathing. Neurologic: No focal deficits.   Lab Results  Component Value Date   CREATININE 1.06 01/29/2021   BUN 19 01/29/2021  NA 139 01/29/2021   K 4.4 01/29/2021   CL 103 01/29/2021   CO2 22 01/29/2021   Lab Results  Component Value Date   ALT 29 01/29/2021    AST 25 01/29/2021   ALKPHOS 85 01/29/2021   BILITOT 0.5 01/29/2021   Lab Results  Component Value Date   HGBA1C 5.5 01/29/2021   HGBA1C 5.5 01/23/2020   Lab Results  Component Value Date   INSULIN 18.1 01/29/2021   INSULIN 11.0 06/09/2020   Lab Results  Component Value Date   TSH 1.740 10/16/2019   Lab Results  Component Value Date   CHOL 173 01/29/2021   HDL 56 01/29/2021   LDLCALC 92 01/29/2021   TRIG 141 01/29/2021   CHOLHDL 3.1 01/29/2021   Lab Results  Component Value Date   VD25OH 76.3 01/29/2021   VD25OH 52.1 06/09/2020   VD25OH 45.7 01/23/2020   Lab Results  Component Value Date   WBC 5.7 01/29/2021   HGB 15.1 01/29/2021   HCT 43.5 01/29/2021   MCV 88 01/29/2021   PLT 232 01/29/2021   Attestation Statements:   Reviewed by clinician on day of visit: allergies, medications, problem list, medical history, surgical history, family history, social history, and previous encounter notes.  I, Water quality scientist, CMA, am acting as transcriptionist for Briscoe Deutscher, DO  I have reviewed the above documentation for accuracy and completeness, and I agree with the above. -  Briscoe Deutscher, DO, MS, FAAFP, DABOM - Family and Bariatric Medicine.

## 2021-04-07 ENCOUNTER — Other Ambulatory Visit (HOSPITAL_BASED_OUTPATIENT_CLINIC_OR_DEPARTMENT_OTHER): Payer: Self-pay

## 2021-04-07 ENCOUNTER — Other Ambulatory Visit (HOSPITAL_COMMUNITY): Payer: Self-pay

## 2021-04-07 DIAGNOSIS — Z0000 Encounter for general adult medical examination without abnormal findings: Secondary | ICD-10-CM | POA: Diagnosis not present

## 2021-04-07 DIAGNOSIS — H6121 Impacted cerumen, right ear: Secondary | ICD-10-CM | POA: Diagnosis not present

## 2021-04-07 DIAGNOSIS — Z125 Encounter for screening for malignant neoplasm of prostate: Secondary | ICD-10-CM | POA: Diagnosis not present

## 2021-04-07 DIAGNOSIS — K5190 Ulcerative colitis, unspecified, without complications: Secondary | ICD-10-CM | POA: Diagnosis not present

## 2021-04-07 DIAGNOSIS — F5102 Adjustment insomnia: Secondary | ICD-10-CM | POA: Diagnosis not present

## 2021-04-07 DIAGNOSIS — E7800 Pure hypercholesterolemia, unspecified: Secondary | ICD-10-CM | POA: Diagnosis not present

## 2021-04-07 MED ORDER — ROSUVASTATIN CALCIUM 10 MG PO TABS
10.0000 mg | ORAL_TABLET | Freq: Every day | ORAL | 3 refills | Status: DC
  Filled 2021-04-07 – 2021-05-19 (×2): qty 90, 90d supply, fill #0
  Filled 2021-08-04: qty 90, 90d supply, fill #1
  Filled 2021-11-10: qty 90, 90d supply, fill #2
  Filled 2022-02-05: qty 90, 90d supply, fill #3

## 2021-04-07 MED ORDER — PFIZER COVID-19 VAC BIVALENT 30 MCG/0.3ML IM SUSP
INTRAMUSCULAR | 0 refills | Status: DC
  Filled 2021-04-07: qty 0.3, 1d supply, fill #0

## 2021-04-07 MED ORDER — LORAZEPAM 1 MG PO TABS
0.5000 mg | ORAL_TABLET | Freq: Every evening | ORAL | 5 refills | Status: DC | PRN
  Filled 2021-04-07: qty 45, 30d supply, fill #0
  Filled 2021-06-10: qty 45, 30d supply, fill #1
  Filled 2021-07-15: qty 45, 30d supply, fill #2
  Filled 2021-08-19: qty 45, 30d supply, fill #3
  Filled 2021-09-22: qty 45, 30d supply, fill #4

## 2021-04-14 ENCOUNTER — Other Ambulatory Visit: Payer: Self-pay

## 2021-04-14 ENCOUNTER — Other Ambulatory Visit (HOSPITAL_COMMUNITY): Payer: Self-pay

## 2021-04-14 ENCOUNTER — Encounter (INDEPENDENT_AMBULATORY_CARE_PROVIDER_SITE_OTHER): Payer: Self-pay | Admitting: Family Medicine

## 2021-04-14 ENCOUNTER — Ambulatory Visit (INDEPENDENT_AMBULATORY_CARE_PROVIDER_SITE_OTHER): Payer: 59 | Admitting: Family Medicine

## 2021-04-14 VITALS — BP 129/85 | HR 74 | Temp 98.3°F | Ht 71.0 in | Wt 229.0 lb

## 2021-04-14 DIAGNOSIS — R7303 Prediabetes: Secondary | ICD-10-CM

## 2021-04-14 DIAGNOSIS — E559 Vitamin D deficiency, unspecified: Secondary | ICD-10-CM | POA: Diagnosis not present

## 2021-04-14 DIAGNOSIS — E669 Obesity, unspecified: Secondary | ICD-10-CM | POA: Diagnosis not present

## 2021-04-14 DIAGNOSIS — Z6834 Body mass index (BMI) 34.0-34.9, adult: Secondary | ICD-10-CM | POA: Diagnosis not present

## 2021-04-14 DIAGNOSIS — F439 Reaction to severe stress, unspecified: Secondary | ICD-10-CM | POA: Diagnosis not present

## 2021-04-14 MED ORDER — TIRZEPATIDE 5 MG/0.5ML ~~LOC~~ SOAJ
5.0000 mg | SUBCUTANEOUS | 0 refills | Status: DC
  Filled 2021-04-14: qty 2, 28d supply, fill #0

## 2021-04-14 MED ORDER — VITAMIN D (ERGOCALCIFEROL) 1.25 MG (50000 UNIT) PO CAPS
50000.0000 [IU] | ORAL_CAPSULE | ORAL | 0 refills | Status: DC
  Filled 2021-04-14: qty 4, 28d supply, fill #0

## 2021-04-14 MED ORDER — TIRZEPATIDE 7.5 MG/0.5ML ~~LOC~~ SOAJ
7.5000 mg | SUBCUTANEOUS | 0 refills | Status: DC
  Filled 2021-04-14: qty 6, 84d supply, fill #0

## 2021-04-15 NOTE — Progress Notes (Signed)
Chief Complaint:   OBESITY David Newman is here to discuss his progress with his obesity treatment plan along with follow-up of his obesity related diagnoses. See Medical Weight Management Flowsheet for complete bioelectrical impedance results.  Today's visit was #: 90 Starting weight: 249 lbs Starting date: 10/16/2019 Weight change since last visit: 5 lbs Total lbs lost to date: 20 lbs Total weight loss percentage to date: -8.03%  Nutrition Plan: Keeping a food journal and adhering to recommended goals of 1800-2200 calories and 135 grams of protein daily for 60% of the time. Activity: Cardio/strength training for 40 minutes 2-3 times per week.  Anti-obesity medications: Mounjaro 7.5 mg subcutaneously weekly. Reported side effects: None.  Interim History: Augustine's Mounjaro was increased to 7.5 mg at last visit.  He says it has been helpful.  He endorses very mild constipation, which is controlled with OTC treatment.  Assessment/Plan:   1. Vitamin D deficiency At goal. He is taking vitamin D 50,000 IU weekly.  Plan: Continue to take prescription Vitamin D @50 ,000 IU every week as prescribed.  Follow-up for routine testing of Vitamin D, at least 2-3 times per year to avoid over-replacement.  Lab Results  Component Value Date   VD25OH 76.3 01/29/2021   VD25OH 52.1 06/09/2020   VD25OH 45.7 01/23/2020   - Refill Vitamin D, Ergocalciferol, (DRISDOL) 1.25 MG (50000 UNIT) CAPS capsule; Take 1 capsule (50,000 Units total) by mouth every 7 (seven) days.  Dispense: 4 capsule; Refill: 0  2. Prediabetes, with polyphagia At goal. Goal is HgbA1c < 5.7.  Medication: Mounjaro 7.5 mg subcutaneously weekly.    Plan:  Continue Mounjaro 7.5 mg .subuc weekly.  Will send in refill for 5 mg and 7.5 mg today (7.5 mg dose not available).  He will continue to focus on protein-rich, low simple carbohydrate foods. We reviewed the importance of hydration, regular exercise for stress reduction, and  restorative sleep.   Lab Results  Component Value Date   HGBA1C 5.5 01/29/2021   Lab Results  Component Value Date   INSULIN 18.1 01/29/2021   INSULIN 11.0 06/09/2020   - Refill tirzepatide (MOUNJARO) 5 MG/0.5ML Pen; Inject 5 mg into the skin once a week for 28 days.  Dispense: 2 mL; Refill: 0 - Refill tirzepatide (MOUNJARO) 7.5 MG/0.5ML Pen; Inject 7.5 mg into the skin once a week.  Dispense: 6 mL; Refill: 0  3. Situational stress David Newman reports being under increased stress recently at work and at home. Motivational interviewing as well as evidence-based interventions for health behavior change were utilized today including the discussion of self monitoring techniques, problem-solving barriers and SMART goal setting techniques.  Specifically regarding patient's less desirable eating habits and patterns, we employed the technique of small changes when he cannot fully commit to his prudent nutritional plan.  4. Obesity, current BMI 32.1  Course: David Newman is currently in the action stage of change. As such, his goal is to continue with weight loss efforts.   Nutrition goals: He has agreed to keeping a food journal and adhering to recommended goals of 1800-2200 calories and 135 grams of protein.   Exercise goals:  As is.  Behavioral modification strategies: increasing lean protein intake, decreasing simple carbohydrates, increasing vegetables, and increasing water intake.  David Newman has agreed to follow-up with our clinic in 4 weeks. He was informed of the importance of frequent follow-up visits to maximize his success with intensive lifestyle modifications for his multiple health conditions.   Objective:   Blood pressure 129/85,  pulse 74, temperature 98.3 F (36.8 C), temperature source Oral, height 5' 11"  (1.803 m), weight 229 lb (103.9 kg), SpO2 96 %. Body mass index is 31.94 kg/m.  General: Cooperative, alert, well developed, in no acute distress. HEENT: Conjunctivae and lids  unremarkable. Cardiovascular: Regular rhythm.  Lungs: Normal work of breathing. Neurologic: No focal deficits.   Lab Results  Component Value Date   CREATININE 1.06 01/29/2021   BUN 19 01/29/2021   NA 139 01/29/2021   K 4.4 01/29/2021   CL 103 01/29/2021   CO2 22 01/29/2021   Lab Results  Component Value Date   ALT 29 01/29/2021   AST 25 01/29/2021   ALKPHOS 85 01/29/2021   BILITOT 0.5 01/29/2021   Lab Results  Component Value Date   HGBA1C 5.5 01/29/2021   HGBA1C 5.5 01/23/2020   Lab Results  Component Value Date   INSULIN 18.1 01/29/2021   INSULIN 11.0 06/09/2020   Lab Results  Component Value Date   TSH 1.740 10/16/2019   Lab Results  Component Value Date   CHOL 173 01/29/2021   HDL 56 01/29/2021   LDLCALC 92 01/29/2021   TRIG 141 01/29/2021   CHOLHDL 3.1 01/29/2021   Lab Results  Component Value Date   VD25OH 76.3 01/29/2021   VD25OH 52.1 06/09/2020   VD25OH 45.7 01/23/2020   Lab Results  Component Value Date   WBC 5.7 01/29/2021   HGB 15.1 01/29/2021   HCT 43.5 01/29/2021   MCV 88 01/29/2021   PLT 232 01/29/2021   Attestation Statements:   Reviewed by clinician on day of visit: allergies, medications, problem list, medical history, surgical history, family history, social history, and previous encounter notes.  I, Water quality scientist, CMA, am acting as transcriptionist for Briscoe Deutscher, DO  I have reviewed the above documentation for accuracy and completeness, and I agree with the above. -  Briscoe Deutscher, DO, MS, FAAFP, DABOM - Family and Bariatric Medicine.

## 2021-04-28 DIAGNOSIS — S40012A Contusion of left shoulder, initial encounter: Secondary | ICD-10-CM | POA: Diagnosis not present

## 2021-04-28 DIAGNOSIS — M25512 Pain in left shoulder: Secondary | ICD-10-CM | POA: Diagnosis not present

## 2021-05-04 MED FILL — Mesalamine Tab Delayed Release 1.2 GM: ORAL | 90 days supply | Qty: 360 | Fill #3 | Status: CN

## 2021-05-05 ENCOUNTER — Other Ambulatory Visit: Payer: Self-pay | Admitting: Internal Medicine

## 2021-05-05 ENCOUNTER — Other Ambulatory Visit (HOSPITAL_COMMUNITY): Payer: Self-pay

## 2021-05-05 ENCOUNTER — Other Ambulatory Visit: Payer: Self-pay

## 2021-05-05 ENCOUNTER — Ambulatory Visit (INDEPENDENT_AMBULATORY_CARE_PROVIDER_SITE_OTHER): Payer: 59 | Admitting: Family Medicine

## 2021-05-05 ENCOUNTER — Encounter (INDEPENDENT_AMBULATORY_CARE_PROVIDER_SITE_OTHER): Payer: Self-pay | Admitting: Family Medicine

## 2021-05-05 VITALS — BP 135/83 | HR 78 | Temp 98.2°F | Ht 71.0 in | Wt 230.0 lb

## 2021-05-05 DIAGNOSIS — F439 Reaction to severe stress, unspecified: Secondary | ICD-10-CM

## 2021-05-05 DIAGNOSIS — K51311 Ulcerative (chronic) rectosigmoiditis with rectal bleeding: Secondary | ICD-10-CM

## 2021-05-05 DIAGNOSIS — E669 Obesity, unspecified: Secondary | ICD-10-CM

## 2021-05-05 DIAGNOSIS — Z6834 Body mass index (BMI) 34.0-34.9, adult: Secondary | ICD-10-CM | POA: Diagnosis not present

## 2021-05-05 DIAGNOSIS — R7303 Prediabetes: Secondary | ICD-10-CM | POA: Diagnosis not present

## 2021-05-05 DIAGNOSIS — E65 Localized adiposity: Secondary | ICD-10-CM | POA: Diagnosis not present

## 2021-05-05 MED ORDER — LIALDA 1.2 G PO TBEC
4.8000 g | DELAYED_RELEASE_TABLET | Freq: Every day | ORAL | 3 refills | Status: DC
  Filled 2021-05-05: qty 360, 90d supply, fill #0
  Filled 2021-08-03: qty 360, 90d supply, fill #1
  Filled 2021-11-10: qty 360, 90d supply, fill #2
  Filled 2022-02-05: qty 360, 90d supply, fill #3

## 2021-05-05 MED ORDER — TIRZEPATIDE 5 MG/0.5ML ~~LOC~~ SOAJ
5.0000 mg | SUBCUTANEOUS | 0 refills | Status: DC
  Filled 2021-05-05: qty 2, 28d supply, fill #0

## 2021-05-06 ENCOUNTER — Other Ambulatory Visit (HOSPITAL_COMMUNITY): Payer: Self-pay

## 2021-05-06 MED ORDER — CARESTART COVID-19 HOME TEST VI KIT
PACK | 0 refills | Status: DC
  Filled 2021-05-06 – 2021-05-19 (×2): qty 4, 4d supply, fill #0

## 2021-05-06 NOTE — Progress Notes (Signed)
Chief Complaint:   OBESITY David Newman is here to discuss his progress with his obesity treatment plan along with follow-up of his obesity related diagnoses. See Medical Weight Management Flowsheet for complete bioelectrical impedance results.  Today's visit was #: 53 Starting weight: 249 lbs Starting date: 10/16/2019 Weight change since last visit: +1 lb Total lbs lost to date: 19 lbs Total weight loss percentage to date: -7.63%  Nutrition Plan: Keeping a food journal and adhering to recommended goals of 1800-2200 calories and 135 grams of protein daily for 50% of the time. Activity: Cardio/strength training for 40 minutes 2-3 times per week.  Anti-obesity medications: Mounjaro 7.5 mg subcutaneously weekly. Reported side effects: Constipation.  Interim History: David Newman has some fluid retention.  He endorses increased constipation with 7.5 mg dose of Mounjaro.  Assessment/Plan:   1. Visceral obesity Current visceral fat rating: 17. Visceral fat rating goal is < 13. Visceral adipose tissue is a hormonally active component of total body fat. This body composition phenotype is associated with medical disorders such as metabolic syndrome, cardiovascular disease, and several malignancies including prostate, breast, and colorectal cancers. Starting goal: Lose 7-10% of starting weight.   2. Prediabetes, with polyphagia At goal. Goal is HgbA1c < 5.7.  Medication: Mounjaro 7.5 mg subcutaneously weekly.    Plan: Decrease Mounjaro to 5 mg subcutaneously weekly due to increased constipation on 7.5 mg dose.  He will continue to focus on protein-rich, low simple carbohydrate foods. We reviewed the importance of hydration, regular exercise for stress reduction, and restorative sleep.   Lab Results  Component Value Date   HGBA1C 5.5 01/29/2021   Lab Results  Component Value Date   INSULIN 18.1 01/29/2021   INSULIN 11.0 06/09/2020   - Decrease tirzepatide (MOUNJARO) 5 MG/0.5ML Pen; Inject 5  mg into the skin once a week.  Dispense: 2 mL; Refill: 0  3. Situational stress David Newman reports being under increased stress recently at work and at home. Motivational interviewing as well as evidence-based interventions for health behavior change were utilized today including the discussion of self monitoring techniques, problem-solving barriers and SMART goal setting techniques.  Specifically regarding patient's less desirable eating habits and patterns, we employed the technique of small changes when he cannot fully commit to his prudent nutritional plan.  4. Obesity, current BMI 32.2  Course: David Newman is currently in the action stage of change. As such, his goal is to continue with weight loss efforts.   Nutrition goals: He has agreed to keeping a food journal and adhering to recommended goals of 1800-2200 calories and 135 grams of protein.   Exercise goals:  As is.  Behavioral modification strategies: increasing lean protein intake, decreasing simple carbohydrates, increasing vegetables, and increasing water intake.  David Newman has agreed to follow-up with our clinic in 4-6 weeks. He was informed of the importance of frequent follow-up visits to maximize his success with intensive lifestyle modifications for his multiple health conditions.   Objective:   Blood pressure 135/83, pulse 78, temperature 98.2 F (36.8 C), temperature source Oral, height 5' 11"  (1.803 m), weight 230 lb (104.3 kg), SpO2 97 %. Body mass index is 32.08 kg/m.  General: Cooperative, alert, well developed, in no acute distress. HEENT: Conjunctivae and lids unremarkable. Cardiovascular: Regular rhythm.  Lungs: Normal work of breathing. Neurologic: No focal deficits.   Lab Results  Component Value Date   CREATININE 1.06 01/29/2021   BUN 19 01/29/2021   NA 139 01/29/2021   K 4.4 01/29/2021  CL 103 01/29/2021   CO2 22 01/29/2021   Lab Results  Component Value Date   ALT 29 01/29/2021   AST 25 01/29/2021    ALKPHOS 85 01/29/2021   BILITOT 0.5 01/29/2021   Lab Results  Component Value Date   HGBA1C 5.5 01/29/2021   HGBA1C 5.5 01/23/2020   Lab Results  Component Value Date   INSULIN 18.1 01/29/2021   INSULIN 11.0 06/09/2020   Lab Results  Component Value Date   TSH 1.740 10/16/2019   Lab Results  Component Value Date   CHOL 173 01/29/2021   HDL 56 01/29/2021   LDLCALC 92 01/29/2021   TRIG 141 01/29/2021   CHOLHDL 3.1 01/29/2021   Lab Results  Component Value Date   VD25OH 76.3 01/29/2021   VD25OH 52.1 06/09/2020   VD25OH 45.7 01/23/2020   Lab Results  Component Value Date   WBC 5.7 01/29/2021   HGB 15.1 01/29/2021   HCT 43.5 01/29/2021   MCV 88 01/29/2021   PLT 232 01/29/2021   Attestation Statements:   Reviewed by clinician on day of visit: allergies, medications, problem list, medical history, surgical history, family history, social history, and previous encounter notes.  I, Water quality scientist, CMA, am acting as transcriptionist for Briscoe Deutscher, DO  I have reviewed the above documentation for accuracy and completeness, and I agree with the above. -  Briscoe Deutscher, DO, MS, FAAFP, DABOM - Family and Bariatric Medicine.

## 2021-05-07 DIAGNOSIS — Z20822 Contact with and (suspected) exposure to covid-19: Secondary | ICD-10-CM | POA: Diagnosis not present

## 2021-05-07 DIAGNOSIS — R0989 Other specified symptoms and signs involving the circulatory and respiratory systems: Secondary | ICD-10-CM | POA: Diagnosis not present

## 2021-05-07 DIAGNOSIS — Z87891 Personal history of nicotine dependence: Secondary | ICD-10-CM | POA: Diagnosis not present

## 2021-05-07 DIAGNOSIS — J029 Acute pharyngitis, unspecified: Secondary | ICD-10-CM | POA: Diagnosis not present

## 2021-05-07 DIAGNOSIS — R059 Cough, unspecified: Secondary | ICD-10-CM | POA: Diagnosis not present

## 2021-05-07 DIAGNOSIS — R0981 Nasal congestion: Secondary | ICD-10-CM | POA: Diagnosis not present

## 2021-05-07 DIAGNOSIS — B349 Viral infection, unspecified: Secondary | ICD-10-CM | POA: Diagnosis not present

## 2021-05-15 ENCOUNTER — Other Ambulatory Visit (HOSPITAL_COMMUNITY): Payer: Self-pay

## 2021-05-19 ENCOUNTER — Other Ambulatory Visit (HOSPITAL_COMMUNITY): Payer: Self-pay

## 2021-05-26 ENCOUNTER — Encounter (INDEPENDENT_AMBULATORY_CARE_PROVIDER_SITE_OTHER): Payer: Self-pay | Admitting: Family Medicine

## 2021-05-26 ENCOUNTER — Other Ambulatory Visit (HOSPITAL_COMMUNITY): Payer: Self-pay

## 2021-05-26 ENCOUNTER — Ambulatory Visit (INDEPENDENT_AMBULATORY_CARE_PROVIDER_SITE_OTHER): Payer: 59 | Admitting: Family Medicine

## 2021-05-26 ENCOUNTER — Other Ambulatory Visit: Payer: Self-pay

## 2021-05-26 VITALS — BP 147/88 | HR 71 | Temp 98.0°F | Ht 71.0 in | Wt 231.0 lb

## 2021-05-26 DIAGNOSIS — E559 Vitamin D deficiency, unspecified: Secondary | ICD-10-CM

## 2021-05-26 DIAGNOSIS — M25512 Pain in left shoulder: Secondary | ICD-10-CM

## 2021-05-26 DIAGNOSIS — R202 Paresthesia of skin: Secondary | ICD-10-CM

## 2021-05-26 DIAGNOSIS — E669 Obesity, unspecified: Secondary | ICD-10-CM

## 2021-05-26 DIAGNOSIS — R7303 Prediabetes: Secondary | ICD-10-CM | POA: Diagnosis not present

## 2021-05-26 DIAGNOSIS — R200 Anesthesia of skin: Secondary | ICD-10-CM

## 2021-05-26 DIAGNOSIS — Z6832 Body mass index (BMI) 32.0-32.9, adult: Secondary | ICD-10-CM | POA: Diagnosis not present

## 2021-05-26 DIAGNOSIS — E65 Localized adiposity: Secondary | ICD-10-CM

## 2021-05-26 MED ORDER — CYCLOBENZAPRINE HCL 5 MG PO TABS
5.0000 mg | ORAL_TABLET | Freq: Three times a day (TID) | ORAL | 1 refills | Status: DC | PRN
Start: 2021-05-26 — End: 2021-07-13
  Filled 2021-05-26: qty 20, 7d supply, fill #0
  Filled 2021-06-10: qty 20, 7d supply, fill #1

## 2021-05-26 MED ORDER — TIRZEPATIDE 5 MG/0.5ML ~~LOC~~ SOAJ
5.0000 mg | SUBCUTANEOUS | 0 refills | Status: DC
  Filled 2021-05-26 – 2021-06-10 (×2): qty 2, 28d supply, fill #0

## 2021-05-26 MED ORDER — VITAMIN D (ERGOCALCIFEROL) 1.25 MG (50000 UNIT) PO CAPS
50000.0000 [IU] | ORAL_CAPSULE | ORAL | 0 refills | Status: DC
  Filled 2021-05-26: qty 4, 28d supply, fill #0

## 2021-05-27 ENCOUNTER — Ambulatory Visit: Payer: 59 | Admitting: Family Medicine

## 2021-05-27 ENCOUNTER — Other Ambulatory Visit (HOSPITAL_COMMUNITY): Payer: Self-pay

## 2021-05-27 ENCOUNTER — Ambulatory Visit: Payer: Self-pay

## 2021-05-27 ENCOUNTER — Ambulatory Visit (INDEPENDENT_AMBULATORY_CARE_PROVIDER_SITE_OTHER): Payer: 59

## 2021-05-27 VITALS — BP 148/98 | HR 70 | Ht 71.0 in | Wt 238.0 lb

## 2021-05-27 DIAGNOSIS — M542 Cervicalgia: Secondary | ICD-10-CM

## 2021-05-27 DIAGNOSIS — M25512 Pain in left shoulder: Secondary | ICD-10-CM | POA: Diagnosis not present

## 2021-05-27 DIAGNOSIS — R202 Paresthesia of skin: Secondary | ICD-10-CM

## 2021-05-27 MED ORDER — GABAPENTIN 300 MG PO CAPS
300.0000 mg | ORAL_CAPSULE | Freq: Three times a day (TID) | ORAL | 3 refills | Status: DC | PRN
  Filled 2021-05-27: qty 90, 30d supply, fill #0

## 2021-05-27 NOTE — Progress Notes (Signed)
I, [name], LAT, ATC acting as a scribe for Lynne Leader, MD.  Subjective:    CC: L shoulder pain  HPI: Pt is a 61 y/o male c/o L shoulder pain ongoing since 12/12. MOI: [place] and was seen at an UC at [facility], landing on his L shoulder and catching his head on the wall. 10-12 days he started to get numbness/tingling into R thumb. Pt is R-hand dominate. Pt noticed atrophy in the R thenar eminence. Pt locates pain to the superior aspect of the L shoulder and into the sternal end of the clavicle. Pt also has pain along the L-side of the neck and numbness into the R thumb and R carpals, radial aspect.  Neck pain: yes Radiates: yes UE Numbness/tingling: yes UE Weakness: no Aggravates: laying on his L side Treatments tried: naproxen, prednisone, flexeril, rest  Pertinent review of Systems: No fevers or chills  Relevant historical information: Sleep apnea   Objective:    Vitals:   05/27/21 1601  BP: (!) 148/98  Pulse: 70  SpO2: 97%   General: Well Developed, well nourished, and in no acute distress.   MSK: Cspine: Normal appearing.  Non-tender midline.  TTP perispinal muscles.  Decreased ROM.  Positive Spurling's test right side. Pulses reflexes and sensation distally.  Intact strength is intact distally except noted below.  Left shoulder: Normal-appearing Tender to palpation AC joint joint. Strength abduction 4/5 external rotation internal rotation intact. Mildly positive Hawkins and Neer's test.  Positive crossover arm compression test.  Lab and Radiology Results No results found for this or any previous visit (from the past 72 hour(s)). DG Cervical Spine Complete  Result Date: 05/27/2021 CLINICAL DATA:  Left side neck pain EXAM: CERVICAL SPINE - COMPLETE 4+ VIEW COMPARISON:  None. FINDINGS: Advanced diffuse degenerative facet disease. Moderate degenerative disc disease in the lower cervical spine from C4-5 through C6-7. No  fracture. Normal alignment. Prevertebral soft tissues are normal. Moderate multilevel bilateral neural foraminal narrowing. IMPRESSION: Degenerative disc and facet disease. Multilevel bilateral neural foraminal narrowing. No acute bony abnormality. Electronically Signed   By: Rolm Baptise M.D.   On: 05/27/2021 21:46   I, Lynne Leader, personally (independently) visualized and performed the interpretation of the images attached in this note.    Impression and Recommendations:    Assessment and Plan: 61 y.o. male with Right hand paresthesia and mild neck pain after a fall.  Concern for C6 radiculopathy. Plan for Xray cpsine as above and trial of gabapentin and PT. If not improving or if worsening will obtain MRI Cspine.   Left shoulder pain. Apparently had negative xray at urgent care. Exam is mostly reassuring. Plan for PT.   Recheck in 6 weeks. Marland Kitchen  PDMP not reviewed this encounter. Orders Placed This Encounter  Procedures   DG Cervical Spine Complete    Standing Status:   Future    Number of Occurrences:   1    Standing Expiration Date:   05/27/2022    Order Specific Question:   Reason for Exam (SYMPTOM  OR DIAGNOSIS REQUIRED)    Answer:   neck pain    Order Specific Question:   Preferred imaging location?    Answer:   [facility]   Ambulatory referral to Physical Therapy    Referral Priority:   Routine    Referral Type:   Physical Medicine    Referral Reason:   Specialty Services Required    Requested  Specialty:   Physical Therapy    Number of Visits Requested:   1   Meds ordered this encounter  Medications   gabapentin (NEURONTIN) 300 MG capsule    Sig: Take 1 capsule (300 mg total) by mouth 3 (three) times daily as needed.    Dispense:  90 capsule    Refill:  3    Discussed warning signs or symptoms. Please see discharge instructions. Patient expresses understanding.   The above documentation has been reviewed and is accurate and complete Lynne Leader, M.D.

## 2021-05-27 NOTE — Progress Notes (Signed)
Chief Complaint:   OBESITY David Newman is here to discuss his progress with his obesity treatment plan along with follow-up of his obesity related diagnoses. See Medical Weight Management Flowsheet for complete bioelectrical impedance results.  Today's visit was #: 28 Starting weight: 249 lbs Starting date: 10/16/2019 Weight change since last visit: +1 lb Total lbs lost to date: 18 lbs Total weight loss percentage to date: -7.23%  Nutrition Plan: Keeping a food journal and adhering to recommended goals of 1800-2200 calories and 135 grams of protein daily for 50% of the time. Activity: Walking/cardio for 15-20 minutes 5 times per week.  Anti-obesity medications: Mounjaro 7.5 mg subcutaneously weekly. Reported side effects: None.  Interim History: David Newman is having left shoulder pain status post fall.  Xray within normal limits at [facility] next day.  He says that his right thumb is starting to go numb.  He is taking Aleve, prednisone 10 mg.  Assessment/Plan:   1. Acute pain of left shoulder Status post fall.  Plan:  Start Flexeril 5 mg three times daily as needed for muscle spasms.  Will place referral to Sports Medicine (appointment this week if possible).  - Start cyclobenzaprine (FLEXERIL) 5 MG tablet; Take 1 tablet (5 mg total) by mouth 3 (three) times daily as needed for muscle spasms.  Dispense: 20 tablet; Refill: 1 - Ambulatory referral to Sports Medicine  2. Prediabetes, with polyphagia At goal. Goal is HgbA1c < 5.7.  Medication: Mounjaro 7.5 mg subcutaneously weekly.    Plan:  Decrease Mounjaro to 5 mg subcutaneously weekly.  He feels more comfortable at 5 mg.  Will send to pharmacy today. He will continue to focus on protein-rich, low simple carbohydrate foods. We reviewed the importance of hydration, regular exercise for stress reduction, and restorative sleep.   Lab Results  Component Value Date   HGBA1C 5.5 01/29/2021   Lab Results  Component Value Date   INSULIN 18.1  01/29/2021   INSULIN 11.0 06/09/2020   - Decrease tirzepatide (MOUNJARO) 5 MG/0.5ML Pen; Inject 5 mg into the skin once a week.  Dispense: 2 mL; Refill: 0  3. Vitamin D deficiency At goal. He is taking vitamin D 50,000 IU weekly.  Plan: Continue to take prescription Vitamin D @50 ,000 IU every week as prescribed.  Follow-up for routine testing of Vitamin D, at least 2-3 times per year to avoid over-replacement.  Lab Results  Component Value Date   VD25OH 76.3 01/29/2021   VD25OH 52.1 06/09/2020   VD25OH 45.7 01/23/2020   - Refill Vitamin D, Ergocalciferol, (DRISDOL) 1.25 MG (50000 UNIT) CAPS capsule; Take 1 capsule (50,000 Units total) by mouth every 7 (seven) days.  Dispense: 4 capsule; Refill: 0  4. Visceral obesity Current visceral fat rating: 17. Visceral fat rating goal is < 13. Visceral adipose tissue is a hormonally active component of total body fat. This body composition phenotype is associated with medical disorders such as metabolic syndrome, cardiovascular disease, and several malignancies including prostate, breast, and colorectal cancers. Starting goal: Lose 7-10% of starting weight.   5. Numbness and tingling of right thumb Referral to Sports Medicine as above. Will follow along.  6. Obesity, current BMI 32.2  Course: David Newman is currently in the action stage of change. As such, his goal is to continue with weight loss efforts.   Nutrition goals: He has agreed to keeping a food journal and adhering to recommended goals of 1800-2200 calories and 135 grams of protein.   Exercise goals:  As is.  Behavioral modification strategies: increasing lean protein intake, decreasing simple carbohydrates, increasing vegetables, and increasing water intake.  David Newman has agreed to follow-up with our clinic in 4 weeks. He was informed of the importance of frequent follow-up visits to maximize his success with intensive lifestyle modifications for his multiple health conditions.    Objective:   Blood pressure (!) 147/88, pulse 71, temperature 98 F (36.7 C), temperature source Oral, height 5' 11"  (1.803 m), weight 231 lb (104.8 kg), SpO2 98 %. Body mass index is 32.22 kg/m.  General: Cooperative, alert, well developed, in no acute distress. HEENT: Conjunctivae and lids unremarkable. Cardiovascular: Regular rhythm.  Lungs: Normal work of breathing. Neurologic: No focal deficits.   Lab Results  Component Value Date   CREATININE 1.06 01/29/2021   BUN 19 01/29/2021   NA 139 01/29/2021   K 4.4 01/29/2021   CL 103 01/29/2021   CO2 22 01/29/2021   Lab Results  Component Value Date   ALT 29 01/29/2021   AST 25 01/29/2021   ALKPHOS 85 01/29/2021   BILITOT 0.5 01/29/2021   Lab Results  Component Value Date   HGBA1C 5.5 01/29/2021   HGBA1C 5.5 01/23/2020   Lab Results  Component Value Date   INSULIN 18.1 01/29/2021   INSULIN 11.0 06/09/2020   Lab Results  Component Value Date   TSH 1.740 10/16/2019   Lab Results  Component Value Date   CHOL 173 01/29/2021   HDL 56 01/29/2021   LDLCALC 92 01/29/2021   TRIG 141 01/29/2021   CHOLHDL 3.1 01/29/2021   Lab Results  Component Value Date   VD25OH 76.3 01/29/2021   VD25OH 52.1 06/09/2020   VD25OH 45.7 01/23/2020   Lab Results  Component Value Date   WBC 5.7 01/29/2021   HGB 15.1 01/29/2021   HCT 43.5 01/29/2021   MCV 88 01/29/2021   PLT 232 01/29/2021   Attestation Statements:   Reviewed by clinician on day of visit: allergies, medications, problem list, medical history, surgical history, family history, social history, and previous encounter notes.  I, Water quality scientist, CMA, am acting as transcriptionist for Briscoe Deutscher, DO  I have reviewed the above documentation for accuracy and completeness, and I agree with the above. -  Briscoe Deutscher, DO, MS, FAAFP, DABOM - Family and Bariatric Medicine.

## 2021-05-27 NOTE — Patient Instructions (Addendum)
Thank you for coming in today.   Please get an Xray today before you leave   I've sent the Gabapentin to your pharmacy.  I've referred you to Physical Therapy.  Let us know if you don't hear from them in one week.   Recheck back in 6 weeks

## 2021-05-28 NOTE — Progress Notes (Signed)
Cervical spine x-ray shows some arthritis changes.  Multiple areas where nerves could get pinched are present.  We will see this better with an MRI if needed in the future.

## 2021-06-09 ENCOUNTER — Other Ambulatory Visit: Payer: Self-pay

## 2021-06-09 ENCOUNTER — Ambulatory Visit: Payer: 59 | Attending: Family Medicine | Admitting: Rehabilitative and Restorative Service Providers"

## 2021-06-09 DIAGNOSIS — Z9181 History of falling: Secondary | ICD-10-CM | POA: Diagnosis not present

## 2021-06-09 DIAGNOSIS — R293 Abnormal posture: Secondary | ICD-10-CM

## 2021-06-09 DIAGNOSIS — W19XXXD Unspecified fall, subsequent encounter: Secondary | ICD-10-CM | POA: Diagnosis not present

## 2021-06-09 DIAGNOSIS — S6991XD Unspecified injury of right wrist, hand and finger(s), subsequent encounter: Secondary | ICD-10-CM | POA: Insufficient documentation

## 2021-06-09 DIAGNOSIS — R200 Anesthesia of skin: Secondary | ICD-10-CM | POA: Diagnosis not present

## 2021-06-09 DIAGNOSIS — M542 Cervicalgia: Secondary | ICD-10-CM

## 2021-06-09 DIAGNOSIS — M25512 Pain in left shoulder: Secondary | ICD-10-CM | POA: Insufficient documentation

## 2021-06-09 NOTE — Patient Instructions (Signed)
Access Code: [email or web address] URL: [email or web address] Date: 06/09/2021 Prepared by: Rudell Cobb  Exercises First Rib Mobilization with Strap - 2 x daily - 7 x weekly - 1 sets - 2 reps - 20-30 seconds hold Sternocleidomastoid Stretch - 2 x daily - 7 x weekly - 1 sets - 2-3 reps - 20-30 seconds hold Doorway Pec Stretch at 60 Elevation - 2 x daily - 7 x weekly - 1 sets - 2 reps - 20-30 seconds hold Shoulder External Rotation and Scapular Retraction - 2 x daily - 7 x weekly - 1 sets - 15 reps - 3 seconds hold

## 2021-06-10 ENCOUNTER — Other Ambulatory Visit (HOSPITAL_COMMUNITY): Payer: Self-pay

## 2021-06-10 ENCOUNTER — Encounter: Payer: Self-pay | Admitting: Rehabilitative and Restorative Service Providers"

## 2021-06-10 NOTE — Therapy (Addendum)
[place], [place], [address] Phone: [phone]   Fax:  [phone]  Physical Therapy Evaluation  Patient Details  Name: David Newman MRN: [id number] Date of Birth: Jun 27, 1960 Referring Provider (PT): Lynne Leader, MD   Encounter Date: 06/09/2021   PT End of Session - 06/09/21 1300     Visit Number 1    Number of Visits 12    Date for PT Re-Evaluation 07/21/21    Authorization Type UMR    PT Start Time 0933    PT Stop Time 1020    PT Time Calculation (min) 47 min             Past Medical History:  Diagnosis Date   Anal fissure    At risk for sleep apnea    STOP-BANG= 5    SENT TO PCP 03-27-2014   Back pain    CAD (coronary artery disease)    Colon polyps    hyperplastic   DDD (degenerative disc disease), lumbar    GERD (gastroesophageal reflux disease)    Gout    High blood pressure    Hyperlipidemia    Joint pain    Knee pain    OA (osteoarthritis) of knee    Prediabetes    Right knee meniscal tear    Sleep apnea    Wears CPAP.   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[place])    Wears contact lenses    Wears glasses     Past Surgical History:  Procedure Laterality Date   COLONOSCOPY  04-24-2012   FACIAL RECONSTRUCTION SURGERY  1973  age 28   KNEE ARTHROSCOPY Right 03/29/2014   Procedure: RIGHT ARTHROSCOPY KNEE WITH DEBRIDMENT PARTIAL medial lateral MENISCECTOMY AND CHONDROPLASTY;  Surgeon: Sydnee Cabal, MD;  Location: [facility];  Service: Orthopedics;  Laterality: Right;   KNEE ARTHROSCOPY WITH ANTERIOR CRUCIATE LIGAMENT (ACL) REPAIR Right 1994   KNEE ARTHROSCOPY WITH MEDIAL MENISECTOMY Left 12/28/2018   Procedure: KNEE ARTHROSCOPY WITH partial MEDIAL MENISECTOMY, debridement chondroplasty;  Surgeon: Sydnee Cabal, MD;  Location: [facility];  Service: Orthopedics;  Laterality: Left;  with knee block   WISDOM TOOTH EXTRACTION  age 49     There were no vitals filed for this visit.   Subjective Assessment - 06/09/21 0937     Subjective The patient had a fall 04/27/21 slipping on a wet basement floor.  He fell on the L shoulder and hit his head against a door frame.  He had initial pain on the L side.  10-12 days post fall he began with tingling down into the right thumb. He has constant tingling in the R thumb. He notes soreness in L AC joint and sternal/clavicular joint is mildly sore.  Tightness in L upper trap.  He is having a hard time sleeping on the left side.    Diagnostic tests Degenerative disc and facet disease. Multilevel bilateral neural  foraminal narrowing. No acute bony abnormality.    Patient Stated Goals Decrease tightness in neck/shoulder, decrease in numbness and tingling in the R thumb.    Currently in Pain? Yes    Pain Score 3    at it's worst   Pain Location Shoulder    Pain Orientation Left    Pain Descriptors / Indicators Aching;Tightness    Pain Type Acute pain;Chronic pain    Pain Radiating Towards radiating numbness into the right thmb    Pain Onset More than a month ago  Pain Frequency Constant    Aggravating Factors  laying on the left side    Pain Relieving Factors pain has improved since onset (first couple of weeks more severe pain)                [facility] PT Assessment - 06/09/21 0946       Assessment   Medical Diagnosis M25.512 (ICD-10-CM) - Left shoulder pain, unspecified chronicity  M54.2 (ICD-10-CM) - Neck pain on left side    Referring Provider (PT) Lynne Leader, MD    Onset Date/Surgical Date 04/27/21    Hand Dominance Right    Prior Therapy none      Precautions   Precautions None      Restrictions   Weight Bearing Restrictions No      Balance Screen   Has the patient fallen in the past 6 months Yes    How many times? 1--at onset- slipping    Has the patient had a decrease in activity level because of a fear of falling?  No    Is the patient reluctant to leave  their home because of a fear of falling?  No      Home Ecologist residence      Prior Function   Level of Independence Independent    Vocation Full time employment    Vocation Requirements at computer, IT      Observation/Other Assessments   Focus on Therapeutic Outcomes (FOTO)  57%      Sensation   Light Touch Impaired Detail    Additional Comments R side tingling in thumb  / seems to be more isolated to the thumb since beginning neurontin      ROM / Strength   AROM / PROM / Strength AROM;Strength      AROM   Overall AROM  Deficits    Overall AROM Comments pain with rotation and sidebending "left side is sore"    AROM Assessment Site Cervical    Cervical Flexion 40    Cervical Extension 40    Cervical - Right Side Bend 20    Cervical - Left Side Bend 28    Cervical - Right Rotation 50    Cervical - Left Rotation 50      Strength   Overall Strength Deficits    Overall Strength Comments --    Strength Assessment Site Shoulder;Elbow;Hand    Right/Left Shoulder Right;Left    Right Shoulder Flexion 5/5    Right Shoulder ABduction 5/5    Left Shoulder Flexion 5/5    Left Shoulder ABduction 5/5    Right/Left Elbow Right;Left    Right Elbow Flexion 5/5    Right Elbow Extension 5/5    Left Elbow Flexion 5/5    Left Elbow Extension 5/5    Right/Left hand Right;Left    Right Hand Grip (lbs) 125    Right Hand Lateral Pinch 17 lbs    Left Hand Grip (lbs) 115    Left Hand Lateral Pinch 15 lbs      Palpation   Spinal mobility hypomobility c-spine    Palpation comment myofascial trigger points along bilat upper trap, levator, R scalenes, cervical paraspinals bilaterally; Patient has some tightness with AC ant-post mobilizations and sternoclavicular mobs, also notes tenderness at costosternal joints      Special Tests    Special Tests Cervical    Cervical Tests Dictraction      Distraction Test   Comment pain with compression, improved  with  distraction                           [facility] Adult PT Treatment/Exercise - 06/10/21 1044       Exercises   Exercises Shoulder;Neck      Neck Exercises: Stretches   Other Neck Stretches seated first rib depression stretch with belt    Other Neck Stretches SCM stretch      Shoulder Exercises: Standing   External Rotation Strengthening;Both;10 reps    External Rotation Limitations with postural cues      Shoulder Exercises: Stretch   Corner Stretch Limitations door frame stretch                     PT Education - 06/09/21 1026     Education Details HEP    Person(s) Educated Patient    Methods Explanation;Demonstration;Handout    Comprehension Verbalized understanding;Returned demonstration                 PT Long Term Goals - 06/10/21 1048       PT LONG TERM GOAL #1   Title The patinet will be indep with HEP.    Time 6    Period Weeks    Target Date 07/21/21      PT LONG TERM GOAL #2   Title The patient will imrpove functional status score from 57% up to 71% to demo dec'd limitation.    Time 6    Period Weeks    Target Date 07/21/21      PT LONG TERM GOAL #3   Title The patient will report resolution of R thumb numbness and tingling.    Time 6    Period Weeks    Target Date 07/21/21      PT LONG TERM GOAL #4   Title The patient will tolerate laying on his left side without increased pain.    Time 6    Period Weeks    Target Date 07/21/21      PT LONG TERM GOAL #5   Title The patient will demo improving posture and verbalize strategies to improve mechanics in work environment.    Time 6    Period Weeks    Target Date 07/21/21                   Plan - 06/10/21 1050     Clinical Impression Statement The patient is a 61 year old male presenting to OP physical therapy with onset of R thumb numbness and tingling s/p fall on the L side in December.  He presents to OP PT today with impairments in cervical ROM, joint  mobility in C-spine, myofascial trigger points, muscle shortening, pain with palpation and radicular symptoms.  PT to address deficits in order to reduce radicular symptoms, reduce pain, and improve postural strengthening.    Examination-Activity Limitations Sleep;Lift    Examination-Participation Restrictions Occupation    Stability/Clinical Decision Making Stable/Uncomplicated    Clinical Decision Making Low    Rehab Potential Good    PT Frequency 2x / week    PT Duration 6 weeks    PT Treatment/Interventions Taping;Dry needling;ADLs/Self Care Home Management;Patient/family education;Cryotherapy;Electrical Stimulation;Moist Heat;Traction;Therapeutic exercise;Therapeutic activities;Manual techniques;Passive range of motion    PT Next Visit Plan DN for cervical paraspinals and upper trap; postural stretching, scapular strengthening/posterior shoulder girdle strengthening, progress HEP    PT Home Exercise Plan [email or web address]    Consulted and Agree  with Plan of Care Patient             Patient will benefit from skilled therapeutic intervention in order to improve the following deficits and impairments:  Pain, Hypomobility, Impaired flexibility, Decreased range of motion, Impaired tone, Increased fascial restricitons, Postural dysfunction  Visit Diagnosis: Cervicalgia  Abnormal posture     Problem List Patient Active Problem List   Diagnosis Date Noted   Aortic atherosclerosis ([place]), Cardiac CT 02/27/20 03/04/2020   Esophageal thickening, Cardiac CT 02/27/20 03/04/2020   Abnormal screening cardiac CT, 02/27/20, score 336, 89th% 03/04/2020   Visceral obesity 03/04/2020   Mixed hyperlipidemia, at goal, Rx Crestor 73/71/0626   Metabolic syndrome 94/85/4627   Vitamin D deficiency, at goal with supplementation 11/13/2019   B12 deficiency, at goal with supplementation 11/13/2019   Prediabetes 11/13/2019   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   Low back pain 04/20/2018   Class 1  obesity with serious comorbidity and body mass index (BMI) of 32.0 to 32.9 in adult 08/05/2017   OSA on CPAP 04/05/2017   Ulcerative colitis ([place]) 11/04/2015   Adjustment insomnia 11/04/2015   Allergic rhinitis 11/03/2015   S/P right knee arthroscopy 03/29/2014    Sheri Gatchel, PT 06/10/2021, 10:57 AM  [facility] [place], [place], [address] Phone: [phone]   Fax:  [phone]  Name: David Newman MRN: [id number] Date of Birth: Jul 29, 1960

## 2021-06-11 ENCOUNTER — Encounter: Payer: Self-pay | Admitting: Rehabilitative and Restorative Service Providers"

## 2021-06-11 ENCOUNTER — Ambulatory Visit: Payer: 59 | Admitting: Rehabilitative and Restorative Service Providers"

## 2021-06-11 ENCOUNTER — Other Ambulatory Visit: Payer: Self-pay

## 2021-06-11 DIAGNOSIS — M25512 Pain in left shoulder: Secondary | ICD-10-CM | POA: Diagnosis not present

## 2021-06-11 DIAGNOSIS — M542 Cervicalgia: Secondary | ICD-10-CM

## 2021-06-11 DIAGNOSIS — R200 Anesthesia of skin: Secondary | ICD-10-CM | POA: Diagnosis not present

## 2021-06-11 DIAGNOSIS — S6991XD Unspecified injury of right wrist, hand and finger(s), subsequent encounter: Secondary | ICD-10-CM | POA: Diagnosis not present

## 2021-06-11 DIAGNOSIS — R293 Abnormal posture: Secondary | ICD-10-CM

## 2021-06-11 DIAGNOSIS — Z9181 History of falling: Secondary | ICD-10-CM | POA: Diagnosis not present

## 2021-06-11 NOTE — Therapy (Signed)
[place], [place], [address] Phone: [phone]   Fax:  [phone]  Physical Therapy Treatment  Patient Details  Name: David Newman MRN: [id number] Date of Birth: Feb 28, 1961 Referring Provider (PT): Lynne Leader, MD   Encounter Date: 06/11/2021   PT End of Session - 06/11/21 0850     Visit Number 2    Number of Visits 12    Date for PT Re-Evaluation 07/21/21    Authorization Type UMR    PT Start Time 0848    PT Stop Time 0934    PT Time Calculation (min) 46 min    Activity Tolerance Patient tolerated treatment well             Past Medical History:  Diagnosis Date   Anal fissure    At risk for sleep apnea    STOP-BANG= 5    SENT TO PCP 03-27-2014   Back pain    CAD (coronary artery disease)    Colon polyps    hyperplastic   DDD (degenerative disc disease), lumbar    GERD (gastroesophageal reflux disease)    Gout    High blood pressure    Hyperlipidemia    Joint pain    Knee pain    OA (osteoarthritis) of knee    Prediabetes    Right knee meniscal tear    Sleep apnea    Wears CPAP.   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[place])    Wears contact lenses    Wears glasses     Past Surgical History:  Procedure Laterality Date   COLONOSCOPY  04-24-2012   FACIAL RECONSTRUCTION SURGERY  1973  age 78   KNEE ARTHROSCOPY Right 03/29/2014   Procedure: RIGHT ARTHROSCOPY KNEE WITH DEBRIDMENT PARTIAL medial lateral MENISCECTOMY AND CHONDROPLASTY;  Surgeon: Sydnee Cabal, MD;  Location: [facility];  Service: Orthopedics;  Laterality: Right;   KNEE ARTHROSCOPY WITH ANTERIOR CRUCIATE LIGAMENT (ACL) REPAIR Right 1994   KNEE ARTHROSCOPY WITH MEDIAL MENISECTOMY Left 12/28/2018   Procedure: KNEE ARTHROSCOPY WITH partial MEDIAL MENISECTOMY, debridement chondroplasty;  Surgeon: Sydnee Cabal, MD;  Location: [facility];  Service: Orthopedics;  Laterality: Left;  with  knee block   WISDOM TOOTH EXTRACTION  age 43    There were no vitals filed for this visit.   Subjective Assessment - 06/11/21 0850     Subjective Patient reports that he was sore following the initial evaluation and treatment. No pain but has numbness in the Rt thumb. Has increased pain and tightness in the Lt shoulder area when sleeping on the Lt side. Decreaesd tightness following DN and manual work    Currently in Pain? No/denies    Pain Score 0-No pain                               [facility] Adult PT Treatment/Exercise - 06/11/21 0001       Neck Exercises: Standing   Neck Retraction 10 reps;5 secs    Neck Retraction Limitations back along noodle    Other Standing Exercises scap squeeze 10 sec x 10 foam roll along spine      Neck Exercises: Seated   Neck Retraction 5 reps;3 secs    Shoulder Rolls Backwards;10 reps      Moist Heat Therapy   Number Minutes Moist Heat 10 Minutes    Moist Heat Location Cervical;Shoulder      Electrical Stimulation  Electrical Stimulation Location bilat cervical and anterior sjoulder/clavicular area    Electrical Stimulation Action TENS    Electrical Stimulation Parameters to tolerance    Electrical Stimulation Goals Pain;Tone      Manual Therapy   Manual therapy comments skilled palpation to assess response to DN and manual work    Joint Mobilization cervical PA mobs Grade II/III    Soft tissue mobilization deep tissue work through the ant/lat cervical musculature; pecs; upper traps    Myofascial Release anterior chest    Manual Traction cervical traction 10-20 sec x 3 reps              Trigger Point Dry Needling - 06/11/21 0001     Consent Given? Yes    Education Handout Provided Previously provided    Muscles Treated Head and Neck Sternocleidomastoid;Upper trapezius;Suboccipitals;Oblique capitus;Scalenes;Cervical multifidi;Semispinalis capitus;Splenius capitus    Other Dry Needling bilat    Sternocleidomastoid  Response Palpable increased muscle length;Twitch response elicited    Scalenes Response Palpable increased muscle length;Twitch reponse elicited    Pectoralis Major Response Palpable increased muscle length;Twitch response elicited    Pectoralis Minor Response Palpable increased muscle length;Twitch response elicited                   PT Education - 06/11/21 0931     Education Details TENS HEP    Person(s) Educated Patient    Methods Explanation;Demonstration;Tactile cues;Verbal cues;Handout    Comprehension Verbalized understanding;Returned demonstration;Verbal cues required;Tactile cues required                 PT Long Term Goals - 06/10/21 1048       PT LONG TERM GOAL #1   Title The patinet will be indep with HEP.    Time 6    Period Weeks    Target Date 07/21/21      PT LONG TERM GOAL #2   Title The patient will imrpove functional status score from 57% up to 71% to demo dec'd limitation.    Time 6    Period Weeks    Target Date 07/21/21      PT LONG TERM GOAL #3   Title The patient will report resolution of R thumb numbness and tingling.    Time 6    Period Weeks    Target Date 07/21/21      PT LONG TERM GOAL #4   Title The patient will tolerate laying on his left side without increased pain.    Time 6    Period Weeks    Target Date 07/21/21      PT LONG TERM GOAL #5   Title The patient will demo improving posture and verbalize strategies to improve mechanics in work environment.    Time 6    Period Weeks    Target Date 07/21/21                   Plan - 06/11/21 0929     Clinical Impression Statement Sore from initial treatment and HEP. Continued tightness noted through the scaleni; pecs; upper traps; cervical and thoracic paraspinals. Tolerated DN well with good release of muscular tightness and report of improvement with less tightness and discomfort following treatment    Rehab Potential Good    PT Frequency 2x / week    PT  Duration 6 weeks    PT Treatment/Interventions Taping;Dry needling;ADLs/Self Care Home Management;Patient/family education;Cryotherapy;Electrical Stimulation;Moist Heat;Traction;Therapeutic exercise;Therapeutic activities;Manual techniques;Passive range of motion    PT  Next Visit Plan DN for cervical paraspinals and upper trap; postural stretching, scapular strengthening/posterior shoulder girdle strengthening, progress HEP    PT Home Exercise Plan [email or web address]    Consulted and Agree with Plan of Care Patient             Patient will benefit from skilled therapeutic intervention in order to improve the following deficits and impairments:     Visit Diagnosis: Cervicalgia  Abnormal posture     Problem List Patient Active Problem List   Diagnosis Date Noted   Aortic atherosclerosis ([address]), Cardiac CT 02/27/20 03/04/2020   Esophageal thickening, Cardiac CT 02/27/20 03/04/2020   Abnormal screening cardiac CT, 02/27/20, score 336, 89th% 03/04/2020   Visceral obesity 03/04/2020   Mixed hyperlipidemia, at goal, Rx Crestor 27/07/5007   Metabolic syndrome 38/18/2993   Vitamin D deficiency, at goal with supplementation 11/13/2019   B12 deficiency, at goal with supplementation 11/13/2019   Prediabetes 11/13/2019   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   Low back pain 04/20/2018   Class 1 obesity with serious comorbidity and body mass index (BMI) of 32.0 to 32.9 in adult 08/05/2017   OSA on CPAP 04/05/2017   Ulcerative colitis ([place]) 11/04/2015   Adjustment insomnia 11/04/2015   Allergic rhinitis 11/03/2015   S/P right knee arthroscopy 03/29/2014    Jane Broughton Nilda Simmer, PT, MPH  06/11/2021, 10:17 AM  [facility] [place], [place], [address] Phone: [phone]   Fax:  [phone]  Name: RONNY KORFF MRN: [id number] Date of Birth: 12/12/1960

## 2021-06-11 NOTE — Patient Instructions (Addendum)
TENS UNIT: This is helpful for muscle pain and spasm.   Search and Purchase a TENS 7000 2nd edition at www.tenspros.com. It should be less than $30.     TENS unit instructions: Do not shower or bathe with the unit on Turn the unit off before removing electrodes or batteries If the electrodes lose stickiness add a drop of water to the electrodes after they are disconnected from the unit and place on plastic sheet. If you continued to have difficulty, call the TENS unit company to purchase more electrodes. Do not apply lotion on the skin area prior to use. Make sure the skin is clean and dry as this will help prolong the life of the electrodes. After use, always check skin for unusual red areas, rash or other skin difficulties. If there are any skin problems, does not apply electrodes to the same area. Never remove the electrodes from the unit by pulling the wires. Do not use the TENS unit or electrodes other than as directed. Do not change electrode placement without consultating your therapist or physician. Keep 2 fingers with between each electrode.  Access Code: [email or web address] URL: [email or web address] Date: 06/11/2021 Prepared by: Gillermo Murdoch  Exercises First Rib Mobilization with Strap - 2 x daily - 7 x weekly - 1 sets - 2 reps - 20-30 seconds hold Sternocleidomastoid Stretch - 2 x daily - 7 x weekly - 1 sets - 2-3 reps - 20-30 seconds hold Doorway Pec Stretch at 60 Elevation - 2 x daily - 7 x weekly - 1 sets - 2 reps - 20-30 seconds hold Shoulder External Rotation and Scapular Retraction - 2 x daily - 7 x weekly - 1 sets - 15 reps - 3 seconds hold Seated Cervical Retraction - 2 x daily - 7 x weekly - 1-2 sets - 5-10 reps - 10 sec hold Seated Scapular Retraction - 2 x daily - 7 x weekly - 1-2 sets - 10 reps - 10 sec hold

## 2021-06-16 ENCOUNTER — Encounter (INDEPENDENT_AMBULATORY_CARE_PROVIDER_SITE_OTHER): Payer: Self-pay | Admitting: Family Medicine

## 2021-06-16 ENCOUNTER — Other Ambulatory Visit: Payer: Self-pay

## 2021-06-16 ENCOUNTER — Ambulatory Visit (INDEPENDENT_AMBULATORY_CARE_PROVIDER_SITE_OTHER): Payer: 59 | Admitting: Family Medicine

## 2021-06-16 VITALS — BP 139/84 | HR 81 | Temp 98.0°F | Ht 71.0 in | Wt 232.0 lb

## 2021-06-16 DIAGNOSIS — M5090 Cervical disc disorder, unspecified, unspecified cervical region: Secondary | ICD-10-CM | POA: Diagnosis not present

## 2021-06-16 DIAGNOSIS — Z6834 Body mass index (BMI) 34.0-34.9, adult: Secondary | ICD-10-CM

## 2021-06-16 DIAGNOSIS — E669 Obesity, unspecified: Secondary | ICD-10-CM

## 2021-06-16 DIAGNOSIS — Z6832 Body mass index (BMI) 32.0-32.9, adult: Secondary | ICD-10-CM | POA: Diagnosis not present

## 2021-06-16 DIAGNOSIS — R7303 Prediabetes: Secondary | ICD-10-CM

## 2021-06-16 DIAGNOSIS — F418 Other specified anxiety disorders: Secondary | ICD-10-CM | POA: Diagnosis not present

## 2021-06-17 ENCOUNTER — Other Ambulatory Visit (HOSPITAL_COMMUNITY): Payer: Self-pay

## 2021-06-17 ENCOUNTER — Ambulatory Visit: Payer: 59 | Attending: Family Medicine | Admitting: Rehabilitative and Restorative Service Providers"

## 2021-06-17 ENCOUNTER — Encounter: Payer: Self-pay | Admitting: Rehabilitative and Restorative Service Providers"

## 2021-06-17 DIAGNOSIS — M542 Cervicalgia: Secondary | ICD-10-CM | POA: Diagnosis not present

## 2021-06-17 DIAGNOSIS — R293 Abnormal posture: Secondary | ICD-10-CM | POA: Insufficient documentation

## 2021-06-17 MED ORDER — TIRZEPATIDE 5 MG/0.5ML ~~LOC~~ SOAJ
5.0000 mg | SUBCUTANEOUS | 0 refills | Status: DC
  Filled 2021-06-17: qty 2, 28d supply, fill #0

## 2021-06-17 NOTE — Progress Notes (Signed)
Chief Complaint:   OBESITY David Newman is here to discuss his progress with his obesity treatment plan along with follow-up of his obesity related diagnoses. See Medical Weight Management Flowsheet for complete bioelectrical impedance results.  Today's visit was #: 67 Starting weight: 249 lbs Starting date: 10/16/2019 Weight change since last visit: +1 Total lbs lost to date: 17 lbs Total weight loss percentage to date: -6.83%  Nutrition Plan: Keeping a food journal and adhering to recommended goals of 1800-2200 calories and 135 grams of protein daily for 50-60% of the time. Activity: Walking/cardio for 15-20 minutes 4-5 times per week.  Anti-obesity medications: Mounjaro 5 mg subcutaneously weekly. Reported side effects: None.  Interim History: David Newman says that he is happy to have maintained.  He has been on several fishing trips.  He has gained muscle mass.  Assessment/Plan:   1. Cervical disc disease Followed by Sports Medicine.  In PT. We will continue to monitor symptoms as they relate to his weight loss journey.  2. Prediabetes, with polyphagia At goal. Goal is HgbA1c < 5.7.  Medication: Mounjaro 5 mg subcutaneously weekly.    Plan:  Continue Mounjaro 5 mg subcutaneously weekly.  Will refill today, as per below. He will continue to focus on protein-rich, low simple carbohydrate foods. We reviewed the importance of hydration, regular exercise for stress reduction, and restorative sleep.   Lab Results  Component Value Date   HGBA1C 5.5 01/29/2021   Lab Results  Component Value Date   INSULIN 18.1 01/29/2021   INSULIN 11.0 06/09/2020   - Refill tirzepatide (MOUNJARO) 5 MG/0.5ML Pen; Inject 5 mg into the skin once a week.  Dispense: 2 mL; Refill: 0  3. Situational anxiety Behavior modification techniques were discussed today to help David Newman deal with his anxiety.    4. Obesity, current BMI 32.4  Course: David Newman is currently in the action stage of change. As such,  his goal is to continue with weight loss efforts.   Nutrition goals: He has agreed to keeping a food journal and adhering to recommended goals of 1800-2200 calories and 135 grams of protein.   Exercise goals:  As is.  Behavioral modification strategies: increasing lean protein intake, decreasing simple carbohydrates, increasing vegetables, and increasing water intake.  David Newman has agreed to follow-up with our clinic in 4 weeks. He was informed of the importance of frequent follow-up visits to maximize his success with intensive lifestyle modifications for his multiple health conditions.   Objective:   Blood pressure 139/84, pulse 81, temperature 98 F (36.7 C), temperature source Oral, height 5' 11"  (1.803 m), weight 232 lb (105.2 kg), SpO2 98 %. Body mass index is 32.36 kg/m.  General: Cooperative, alert, well developed, in no acute distress. HEENT: Conjunctivae and lids unremarkable. Cardiovascular: Regular rhythm.  Lungs: Normal work of breathing. Neurologic: No focal deficits.   Lab Results  Component Value Date   CREATININE 1.06 01/29/2021   BUN 19 01/29/2021   NA 139 01/29/2021   K 4.4 01/29/2021   CL 103 01/29/2021   CO2 22 01/29/2021   Lab Results  Component Value Date   ALT 29 01/29/2021   AST 25 01/29/2021   ALKPHOS 85 01/29/2021   BILITOT 0.5 01/29/2021   Lab Results  Component Value Date   HGBA1C 5.5 01/29/2021   HGBA1C 5.5 01/23/2020   Lab Results  Component Value Date   INSULIN 18.1 01/29/2021   INSULIN 11.0 06/09/2020   Lab Results  Component Value Date   TSH  1.740 10/16/2019   Lab Results  Component Value Date   CHOL 173 01/29/2021   HDL 56 01/29/2021   LDLCALC 92 01/29/2021   TRIG 141 01/29/2021   CHOLHDL 3.1 01/29/2021   Lab Results  Component Value Date   VD25OH 76.3 01/29/2021   VD25OH 52.1 06/09/2020   VD25OH 45.7 01/23/2020   Lab Results  Component Value Date   WBC 5.7 01/29/2021   HGB 15.1 01/29/2021   HCT 43.5 01/29/2021    MCV 88 01/29/2021   PLT 232 01/29/2021   Attestation Statements:   Reviewed by clinician on day of visit: allergies, medications, problem list, medical history, surgical history, family history, social history, and previous encounter notes.  I, Water quality scientist, CMA, am acting as transcriptionist for Briscoe Deutscher, DO  I have reviewed the above documentation for accuracy and completeness, and I agree with the above. -  Briscoe Deutscher, DO, MS, FAAFP, DABOM - Family and Bariatric Medicine.

## 2021-06-17 NOTE — Patient Instructions (Addendum)
Access Code: [email or web address] URL: [email or web address] Date: 06/17/2021 Prepared by: Gillermo Murdoch  Exercises Seated Cervical Retraction - 2 x daily - 7 x weekly - 1-2 sets - 5-10 reps - 10 sec hold Seated Scapular Retraction - 2 x daily - 7 x weekly - 1-2 sets - 10 reps - 10 sec hold Shoulder External Rotation and Scapular Retraction - 2 x daily - 7 x weekly - 1 sets - 10 reps - 5 sec hold Shoulder External Rotation in 45 Degrees Abduction - 2 x daily - 7 x weekly - 1-2 sets - 10 reps - 3 sec hold Shoulder External Rotation and Scapular Retraction with Resistance - 2 x daily - 7 x weekly - 1 sets - 10 reps - 3-5 sec hold Standing Shoulder Row Reactive Isometric - 2 x daily - 7 x weekly - 1 sets - 10 reps - 10-15 sec hold Doorway Pec Stretch at 60 Degrees Abduction - 3 x daily - 7 x weekly - 1 sets - 3 reps Doorway Pec Stretch at 90 Degrees Abduction - 3 x daily - 7 x weekly - 1 sets - 3 reps - 30 seconds hold Doorway Pec Stretch at 120 Degrees Abduction - 3 x daily - 7 x weekly - 1 sets - 3 reps - 30 second hold hold

## 2021-06-17 NOTE — Therapy (Signed)
[place], [place], [address] Phone: [phone]   Fax:  [phone]  Physical Therapy Treatment  Patient Details  Name: David Newman MRN: [id number] Date of Birth: 1960-07-31 Referring Provider (PT): Lynne Leader, MD   Encounter Date: 06/17/2021   PT End of Session - 06/17/21 0932     Visit Number 3    Number of Visits 12    Date for PT Re-Evaluation 07/21/21    Authorization Type UMR    PT Start Time 0930    PT Stop Time 1018    PT Time Calculation (min) 48 min    Activity Tolerance Patient tolerated treatment well             Past Medical History:  Diagnosis Date   Anal fissure    At risk for sleep apnea    STOP-BANG= 5    SENT TO PCP 03-27-2014   Back pain    CAD (coronary artery disease)    Colon polyps    hyperplastic   DDD (degenerative disc disease), lumbar    GERD (gastroesophageal reflux disease)    Gout    High blood pressure    Hyperlipidemia    Joint pain    Knee pain    OA (osteoarthritis) of knee    Prediabetes    Right knee meniscal tear    Sleep apnea    Wears CPAP.   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[place])    Wears contact lenses    Wears glasses     Past Surgical History:  Procedure Laterality Date   COLONOSCOPY  04-24-2012   FACIAL RECONSTRUCTION SURGERY  1973  age 54   KNEE ARTHROSCOPY Right 03/29/2014   Procedure: RIGHT ARTHROSCOPY KNEE WITH DEBRIDMENT PARTIAL medial lateral MENISCECTOMY AND CHONDROPLASTY;  Surgeon: Sydnee Cabal, MD;  Location: [facility];  Service: Orthopedics;  Laterality: Right;   KNEE ARTHROSCOPY WITH ANTERIOR CRUCIATE LIGAMENT (ACL) REPAIR Right 1994   KNEE ARTHROSCOPY WITH MEDIAL MENISECTOMY Left 12/28/2018   Procedure: KNEE ARTHROSCOPY WITH partial MEDIAL MENISECTOMY, debridement chondroplasty;  Surgeon: Sydnee Cabal, MD;  Location: [facility];  Service: Orthopedics;  Laterality: Left;  with  knee block   WISDOM TOOTH EXTRACTION  age 44    There were no vitals filed for this visit.   Subjective Assessment - 06/17/21 0932     Subjective Some soreness from DN but it did loosen things up. Thinks he needs to get a new chair for work. He sits at the desk in rounded posture. Has a TENS unit and has used it some at home. Making some progress. Still feels tight through the cervical and shoulder.    Currently in Pain? No/denies    Pain Score 0-No pain    Pain Location Shoulder    Pain Orientation Left    Pain Descriptors / Indicators Aching;Tightness    Pain Type Acute pain;Chronic pain    Pain Onset More than a month ago    Pain Frequency Constant    Aggravating Factors  lying on Lt side and working                               [facility] Adult PT Treatment/Exercise - 06/17/21 0001       Neuro Re-ed    Neuro Re-ed Details  postural correction in standing and sitting      Neck Exercises: Standing   Neck  Retraction 10 reps;5 secs    Neck Retraction Limitations back along noodle    Other Standing Exercises scap squeeze 10 sec x 10 foam roll along spine      Neck Exercises: Seated   Shoulder Rolls Backwards;10 reps    Other Seated Exercise thoracic extension seated with coregeous ball at T-spine; added shoudler elevation x 3-4 reps; trial of thoracic rotation 10-15 sec hold x 2 reps each side      Neck Exercises: Supine   Other Supine Exercise thoracic extension lying on coregeous ball T-spine head supported in neutral with folded pillow ~ 2 min UEs in ~ 80 deg abduction resting on surface      Shoulder Exercises: Standing   Row Strengthening;Both;5 reps;Theraband    Row Limitations isometric hold in row position holding 15-20 sec to recruit posterior shoulder girdle musculature    Retraction Strengthening;Both;10 reps;Theraband    Theraband Level (Shoulder Retraction) Level 2 (Red)    Retraction Limitations spine along noodle focus on posture and alignment  recruiting posterior shoudler girdle musculature      Shoulder Exercises: Stretch   Other Shoulder Stretches doorway stretch 3 positions 30 sec hold x 2 reps each position      Moist Heat Therapy   Number Minutes Moist Heat 10 Minutes    Moist Heat Location Cervical;Shoulder      Electrical Stimulation   Electrical Stimulation Location Lt cervical and anterior shoulder/clavicular area    Electrical Stimulation Action interferential    Electrical Stimulation Parameters to tolerance    Electrical Stimulation Goals Pain;Tone      Manual Therapy   Joint Mobilization cervical PA mobs Grade II/III    Soft tissue mobilization deep tissue work through the Lt ant/lat cervical musculature; pecs; upper traps    Myofascial Release anterior chest    Manual Traction cervical traction 10-20 sec x 3 reps                     PT Education - 06/17/21 0951     Education Details HEP    Person(s) Educated Patient    Methods Explanation;Demonstration;Tactile cues;Verbal cues;Handout    Comprehension Verbalized understanding;Returned demonstration;Verbal cues required;Tactile cues required                 PT Long Term Goals - 06/10/21 1048       PT LONG TERM GOAL #1   Title The patinet will be indep with HEP.    Time 6    Period Weeks    Target Date 07/21/21      PT LONG TERM GOAL #2   Title The patient will imrpove functional status score from 57% up to 71% to demo dec'd limitation.    Time 6    Period Weeks    Target Date 07/21/21      PT LONG TERM GOAL #3   Title The patient will report resolution of R thumb numbness and tingling.    Time 6    Period Weeks    Target Date 07/21/21      PT LONG TERM GOAL #4   Title The patient will tolerate laying on his left side without increased pain.    Time 6    Period Weeks    Target Date 07/21/21      PT LONG TERM GOAL #5   Title The patient will demo improving posture and verbalize strategies to improve mechanics in work  environment.    Time 6  Period Weeks    Target Date 07/21/21                   Plan - 06/17/21 0936     Clinical Impression Statement Positive response to DN and manual work but continues to have symptoms. Note continued tightness in the scaleni, pecs, upper trap, cervical and thoracic paraspinals. Working on posture and alignment. Patient demonstrates significant increased thoracic kyphosis creating head forward posture and keeping anterior structures tight through the neck and chest. Good response to thoracic stretching and postural correction. Will benefit from continued postural correction as well as DN/STM through Lt cervical; upper trap; pecs.    Rehab Potential Good    PT Frequency 2x / week    PT Duration 6 weeks    PT Treatment/Interventions Taping;Dry needling;ADLs/Self Care Home Management;Patient/family education;Cryotherapy;Electrical Stimulation;Moist Heat;Traction;Therapeutic exercise;Therapeutic activities;Manual techniques;Passive range of motion    PT Next Visit Plan DN for cervical paraspinals and upper trap; postural stretching, scapular strengthening/posterior shoulder girdle strengthening, progress HEP    PT Home Exercise Plan [email or web address]    Consulted and Agree with Plan of Care Patient             Patient will benefit from skilled therapeutic intervention in order to improve the following deficits and impairments:     Visit Diagnosis: Cervicalgia  Abnormal posture     Problem List Patient Active Problem List   Diagnosis Date Noted   Aortic atherosclerosis ([place]), Cardiac CT 02/27/20 03/04/2020   Esophageal thickening, Cardiac CT 02/27/20 03/04/2020   Abnormal screening cardiac CT, 02/27/20, score 336, 89th% 03/04/2020   Visceral obesity 03/04/2020   Mixed hyperlipidemia, at goal, Rx Crestor 92/95/7473   Metabolic syndrome 40/37/0964   Vitamin D deficiency, at goal with supplementation 11/13/2019   B12 deficiency, at goal with supplementation  11/13/2019   Prediabetes 11/13/2019   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   Low back pain 04/20/2018   Class 1 obesity with serious comorbidity and body mass index (BMI) of 32.0 to 32.9 in adult 08/05/2017   OSA on CPAP 04/05/2017   Ulcerative colitis ([place]) 11/04/2015   Adjustment insomnia 11/04/2015   Allergic rhinitis 11/03/2015   S/P right knee arthroscopy 03/29/2014    Gretchen Weinfeld Nilda Simmer, PT, MPH  06/17/2021, 1:36 PM  [facility] [place], [place], [address] Phone: [phone]   Fax:  [phone]  Name: David Newman MRN: [id number] Date of Birth: 21-Jan-1961

## 2021-06-19 ENCOUNTER — Other Ambulatory Visit: Payer: Self-pay

## 2021-06-19 ENCOUNTER — Ambulatory Visit: Payer: 59 | Admitting: Physical Therapy

## 2021-06-19 DIAGNOSIS — M542 Cervicalgia: Secondary | ICD-10-CM | POA: Diagnosis not present

## 2021-06-19 DIAGNOSIS — R293 Abnormal posture: Secondary | ICD-10-CM | POA: Diagnosis not present

## 2021-06-19 NOTE — Therapy (Signed)
[place], [place], [address] Phone: [phone]   Fax:  [phone]  Physical Therapy Treatment  Patient Details  Name: David Newman MRN: [id number] Date of Birth: 06/26/1960 Referring Provider (PT): Lynne Leader, MD   Encounter Date: 06/19/2021   PT End of Session - 06/19/21 1017     Visit Number 4    Number of Visits 12    Date for PT Re-Evaluation 07/21/21    Authorization Type UMR    PT Start Time 0845    PT Stop Time 0933    PT Time Calculation (min) 48 min    Activity Tolerance Patient tolerated treatment well    Behavior During Therapy [facility] for tasks assessed/performed             Past Medical History:  Diagnosis Date   Anal fissure    At risk for sleep apnea    STOP-BANG= 5    SENT TO PCP 03-27-2014   Back pain    CAD (coronary artery disease)    Colon polyps    hyperplastic   DDD (degenerative disc disease), lumbar    GERD (gastroesophageal reflux disease)    Gout    High blood pressure    Hyperlipidemia    Joint pain    Knee pain    OA (osteoarthritis) of knee    Prediabetes    Right knee meniscal tear    Sleep apnea    Wears CPAP.   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[place])    Wears contact lenses    Wears glasses     Past Surgical History:  Procedure Laterality Date   COLONOSCOPY  04-24-2012   FACIAL RECONSTRUCTION SURGERY  1973  age 61   KNEE ARTHROSCOPY Right 03/29/2014   Procedure: RIGHT ARTHROSCOPY KNEE WITH DEBRIDMENT PARTIAL medial lateral MENISCECTOMY AND CHONDROPLASTY;  Surgeon: Sydnee Cabal, MD;  Location: [facility];  Service: Orthopedics;  Laterality: Right;   KNEE ARTHROSCOPY WITH ANTERIOR CRUCIATE LIGAMENT (ACL) REPAIR Right 1994   KNEE ARTHROSCOPY WITH MEDIAL MENISECTOMY Left 12/28/2018   Procedure: KNEE ARTHROSCOPY WITH partial MEDIAL MENISECTOMY, debridement chondroplasty;  Surgeon: Sydnee Cabal, MD;  Location: [facility];  Service: Orthopedics;  Laterality: Left;  with knee block   WISDOM TOOTH EXTRACTION  age 61    There were no vitals filed for this visit.   Subjective Assessment - 06/19/21 0846     Subjective Pt states he still feels "sore" but overall feels the same    Patient Stated Goals Decrease tightness in neck/shoulder, decrease in numbness and tingling in the R thumb.    Currently in Pain? No/denies                               [facility] Adult PT Treatment/Exercise - 06/19/21 0001       Neck Exercises: Standing   Neck Retraction 10 reps;5 secs    Neck Retraction Limitations back along noodle    Other Standing Exercises scap squeeze 10 sec x 10 foam roll along spine      Neck Exercises: Seated   Shoulder Rolls Backwards;10 reps      Neck Exercises: Supine   Other Supine Exercise laying on foam roll alt shld flexion, snow angels, bear hugs      Shoulder Exercises: Standing   Row Strengthening;10 reps    Theraband Level (Shoulder Row) Level 3 (Green)  Row Limitations isometric hold 10 sec    Retraction Strengthening;Both;10 reps;Theraband    Theraband Level (Shoulder Retraction) Level 2 (Red)    Retraction Limitations spine along noodle focus on posture and alignment recruiting posterior shoudler girdle musculature      Shoulder Exercises: Stretch   Other Shoulder Stretches doorway stretch 3 positions 30 sec hold x 2 reps each position      Moist Heat Therapy   Number Minutes Moist Heat 10 Minutes    Moist Heat Location Shoulder      Electrical Stimulation   Electrical Stimulation Location Lt cervical and shoulder    Electrical Stimulation Action TENS    Electrical Stimulation Parameters to tolerance    Electrical Stimulation Goals Pain      Manual Therapy   Manual therapy comments skilled palpation to assess effects of dry needling    Soft tissue mobilization upper traps, pecs, scalenes              Trigger Point Dry Needling -  06/19/21 0001     Muscles Treated Head and Neck Upper trapezius    Upper Trapezius Response Twitch reponse elicited    Pectoralis Major Response Palpable increased muscle length    Pectoralis Minor Response Palpable increased muscle length                        PT Long Term Goals - 06/10/21 1048       PT LONG TERM GOAL #1   Title The patinet will be indep with HEP.    Time 6    Period Weeks    Target Date 07/21/21      PT LONG TERM GOAL #2   Title The patient will imrpove functional status score from 57% up to 71% to demo dec'd limitation.    Time 6    Period Weeks    Target Date 07/21/21      PT LONG TERM GOAL #3   Title The patient will report resolution of R thumb numbness and tingling.    Time 6    Period Weeks    Target Date 07/21/21      PT LONG TERM GOAL #4   Title The patient will tolerate laying on his left side without increased pain.    Time 6    Period Weeks    Target Date 07/21/21      PT LONG TERM GOAL #5   Title The patient will demo improving posture and verbalize strategies to improve mechanics in work environment.    Time 6    Period Weeks    Target Date 07/21/21                   Plan - 06/19/21 1018     Clinical Impression Statement Pt with continued tightness in thoracic paraspinals and upper traps. Responds well to laying over foam roll. Good response to dry needling this session    PT Next Visit Plan postural strength and stretching, progress HEP, manual as indicated    PT Home Exercise Plan [email or web address]    Consulted and Agree with Plan of Care Patient             Patient will benefit from skilled therapeutic intervention in order to improve the following deficits and impairments:     Visit Diagnosis: Cervicalgia  Abnormal posture     Problem List Patient Active Problem List   Diagnosis Date Noted   Aortic atherosclerosis ([place]),  Cardiac CT 02/27/20 03/04/2020   Esophageal thickening, Cardiac CT 02/27/20  03/04/2020   Abnormal screening cardiac CT, 02/27/20, score 336, 89th% 03/04/2020   Visceral obesity 03/04/2020   Mixed hyperlipidemia, at goal, Rx Crestor 90/24/0973   Metabolic syndrome 53/29/9242   Vitamin D deficiency, at goal with supplementation 11/13/2019   B12 deficiency, at goal with supplementation 11/13/2019   Prediabetes 11/13/2019   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   Low back pain 04/20/2018   Class 1 obesity with serious comorbidity and body mass index (BMI) of 32.0 to 32.9 in adult 08/05/2017   OSA on CPAP 04/05/2017   Ulcerative colitis ([place]) 11/04/2015   Adjustment insomnia 11/04/2015   Allergic rhinitis 11/03/2015   S/P right knee arthroscopy 03/29/2014    Sharmon Cheramie, PT 06/19/2021, 10:19 AM  [facility] [place], [place], [address] Phone: [phone]   Fax:  [phone]  Name: CAP MASSI MRN: [id number] Date of Birth: 1960-09-01

## 2021-06-23 ENCOUNTER — Other Ambulatory Visit: Payer: Self-pay

## 2021-06-23 ENCOUNTER — Ambulatory Visit: Payer: 59 | Admitting: Physical Therapy

## 2021-06-23 DIAGNOSIS — R293 Abnormal posture: Secondary | ICD-10-CM | POA: Diagnosis not present

## 2021-06-23 DIAGNOSIS — M542 Cervicalgia: Secondary | ICD-10-CM | POA: Diagnosis not present

## 2021-06-23 NOTE — Therapy (Signed)
[place], [place], [address] Phone: [phone]   Fax:  [phone]  Physical Therapy Treatment  Patient Details  Name: David Newman MRN: [id number] Date of Birth: 1960-09-16 Referring Provider (PT): Lynne Leader, MD   Encounter Date: 06/23/2021   PT End of Session - 06/23/21 0918     Visit Number 5    Number of Visits 12    Date for PT Re-Evaluation 07/21/21    Authorization Type UMR    PT Start Time 0836    PT Stop Time 0926    PT Time Calculation (min) 50 min    Activity Tolerance Patient tolerated treatment well    Behavior During Therapy [facility] for tasks assessed/performed             Past Medical History:  Diagnosis Date   Anal fissure    At risk for sleep apnea    STOP-BANG= 5    SENT TO PCP 03-27-2014   Back pain    CAD (coronary artery disease)    Colon polyps    hyperplastic   DDD (degenerative disc disease), lumbar    GERD (gastroesophageal reflux disease)    Gout    High blood pressure    Hyperlipidemia    Joint pain    Knee pain    OA (osteoarthritis) of knee    Prediabetes    Right knee meniscal tear    Sleep apnea    Wears CPAP.   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[place])    Wears contact lenses    Wears glasses     Past Surgical History:  Procedure Laterality Date   COLONOSCOPY  04-24-2012   FACIAL RECONSTRUCTION SURGERY  1973  age 23   KNEE ARTHROSCOPY Right 03/29/2014   Procedure: RIGHT ARTHROSCOPY KNEE WITH DEBRIDMENT PARTIAL medial lateral MENISCECTOMY AND CHONDROPLASTY;  Surgeon: Sydnee Cabal, MD;  Location: [facility];  Service: Orthopedics;  Laterality: Right;   KNEE ARTHROSCOPY WITH ANTERIOR CRUCIATE LIGAMENT (ACL) REPAIR Right 1994   KNEE ARTHROSCOPY WITH MEDIAL MENISECTOMY Left 12/28/2018   Procedure: KNEE ARTHROSCOPY WITH partial MEDIAL MENISECTOMY, debridement chondroplasty;  Surgeon: Sydnee Cabal, MD;  Location: [facility];  Service: Orthopedics;  Laterality: Left;  with knee block   WISDOM TOOTH EXTRACTION  age 36    There were no vitals filed for this visit.   Subjective Assessment - 06/23/21 0840     Subjective Pt states he "feels about the same". He states he might have "a little less" numbness in thumb    Patient Stated Goals Decrease tightness in neck/shoulder, decrease in numbness and tingling in the R thumb.    Currently in Pain? Yes    Pain Score 3     Pain Location Flank    Pain Orientation Right    Pain Descriptors / Indicators Sore                [facility] PT Assessment - 06/23/21 0001       Assessment   Medical Diagnosis M25.512 (ICD-10-CM) - Left shoulder pain, unspecified chronicity  M54.2 (ICD-10-CM) - Neck pain on left side    Referring Provider (PT) Lynne Leader, MD    Onset Date/Surgical Date 04/27/21    Hand Dominance Right    Prior Therapy none      Sensation   Additional Comments pt states tingling in thumb is "less frequent"      AROM   Cervical - Right Side Bend  27    Cervical - Left Side Bend 32                           [facility] Adult PT Treatment/Exercise - 06/23/21 0001       Neck Exercises: Machines for Strengthening   UBE (Upper Arm Bike) L3 x 4 min alt fwd/bkwd      Neck Exercises: Supine   Other Supine Exercise laying on ball alt shoulder flexion, snow angels, bear hugs      Shoulder Exercises: Standing   Extension Strengthening;10 reps    Theraband Level (Shoulder Extension) Level 3 (Green)    Extension Limitations isometric holds 10 sec    Row Strengthening;10 reps    Theraband Level (Shoulder Row) Level 3 (Green)    Row Limitations isometric hold x 10 seconds    Retraction Strengthening;20 reps    Theraband Level (Shoulder Retraction) Level 2 (Red)    Retraction Limitations spine along noodle    Diagonals Right;Left;15 reps    Theraband Level (Shoulder Diagonals) Level 2 (Red)    Diagonals Limitations spine along  noodle      Shoulder Exercises: Stretch   Other Shoulder Stretches doorway stretch 3 positions 30 sec hold x 2 reps each position      Modalities   Modalities Traction      Traction   Type of Traction Cervical    Min (lbs) 14    Max (lbs) 20    Hold Time 60 sec    Rest Time 20 sec    Time 10 min      Manual Therapy   Joint Mobilization cervical PA and lateral mobs grade 2-3    Soft tissue mobilization upper traps, pecs, scalenes                          PT Long Term Goals - 06/10/21 1048       PT LONG TERM GOAL #1   Title The patinet will be indep with HEP.    Time 6    Period Weeks    Target Date 07/21/21      PT LONG TERM GOAL #2   Title The patient will imrpove functional status score from 57% up to 71% to demo dec'd limitation.    Time 6    Period Weeks    Target Date 07/21/21      PT LONG TERM GOAL #3   Title The patient will report resolution of R thumb numbness and tingling.    Time 6    Period Weeks    Target Date 07/21/21      PT LONG TERM GOAL #4   Title The patient will tolerate laying on his left side without increased pain.    Time 6    Period Weeks    Target Date 07/21/21      PT LONG TERM GOAL #5   Title The patient will demo improving posture and verbalize strategies to improve mechanics in work environment.    Time 6    Period Weeks    Target Date 07/21/21                   Plan - 06/23/21 0919     Clinical Impression Statement Pt continues with decreased cervical ROM but has improved lateral flexion bilat. Pt with good tolerance to advanced exercises this visit. Cervical traction trialed to reduce radicular symptoms  PT Next Visit Plan postural strength and stretching, progress HEP, manual as indicated, how was traction?    PT Home Exercise Plan [email or web address]    Consulted and Agree with Plan of Care Patient             Patient will benefit from skilled therapeutic intervention in order to improve the  following deficits and impairments:     Visit Diagnosis: Cervicalgia  Abnormal posture     Problem List Patient Active Problem List   Diagnosis Date Noted   Aortic atherosclerosis ([place]), Cardiac CT 02/27/20 03/04/2020   Esophageal thickening, Cardiac CT 02/27/20 03/04/2020   Abnormal screening cardiac CT, 02/27/20, score 336, 89th% 03/04/2020   Visceral obesity 03/04/2020   Mixed hyperlipidemia, at goal, Rx Crestor 19/16/6060   Metabolic syndrome 04/59/9774   Vitamin D deficiency, at goal with supplementation 11/13/2019   B12 deficiency, at goal with supplementation 11/13/2019   Prediabetes 11/13/2019   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   Low back pain 04/20/2018   Class 1 obesity with serious comorbidity and body mass index (BMI) of 32.0 to 32.9 in adult 08/05/2017   OSA on CPAP 04/05/2017   Ulcerative colitis ([place]) 11/04/2015   Adjustment insomnia 11/04/2015   Allergic rhinitis 11/03/2015   S/P right knee arthroscopy 03/29/2014    Monic Engelmann, PT 06/23/2021, [place], [place], [address] Phone: [phone]   Fax:  [phone]  Name: David Newman MRN: [id number] Date of Birth: 12/25/1960

## 2021-06-26 ENCOUNTER — Other Ambulatory Visit: Payer: Self-pay

## 2021-06-26 ENCOUNTER — Encounter: Payer: Self-pay | Admitting: Rehabilitative and Restorative Service Providers"

## 2021-06-26 ENCOUNTER — Ambulatory Visit: Payer: 59 | Admitting: Rehabilitative and Restorative Service Providers"

## 2021-06-26 DIAGNOSIS — M542 Cervicalgia: Secondary | ICD-10-CM

## 2021-06-26 DIAGNOSIS — R293 Abnormal posture: Secondary | ICD-10-CM

## 2021-06-26 NOTE — Therapy (Signed)
[place], [place], [address] Phone: [phone]   Fax:  [phone]  Physical Therapy Treatment  Patient Details  Name: David Newman MRN: [id number] Date of Birth: 1960-12-21 Referring Provider (PT): Lynne Leader, MD   Encounter Date: 06/26/2021   PT End of Session - 06/26/21 0845     Visit Number 6    Number of Visits 12    Date for PT Re-Evaluation 07/21/21    Authorization Type UMR    PT Start Time 0845    PT Stop Time 0936    PT Time Calculation (min) 51 min    Activity Tolerance Patient tolerated treatment well             Past Medical History:  Diagnosis Date   Anal fissure    At risk for sleep apnea    STOP-BANG= 5    SENT TO PCP 03-27-2014   Back pain    CAD (coronary artery disease)    Colon polyps    hyperplastic   DDD (degenerative disc disease), lumbar    GERD (gastroesophageal reflux disease)    Gout    High blood pressure    Hyperlipidemia    Joint pain    Knee pain    OA (osteoarthritis) of knee    Prediabetes    Right knee meniscal tear    Sleep apnea    Wears CPAP.   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[place])    Wears contact lenses    Wears glasses     Past Surgical History:  Procedure Laterality Date   COLONOSCOPY  04-24-2012   FACIAL RECONSTRUCTION SURGERY  1973  age 31   KNEE ARTHROSCOPY Right 03/29/2014   Procedure: RIGHT ARTHROSCOPY KNEE WITH DEBRIDMENT PARTIAL medial lateral MENISCECTOMY AND CHONDROPLASTY;  Surgeon: Sydnee Cabal, MD;  Location: [facility];  Service: Orthopedics;  Laterality: Right;   KNEE ARTHROSCOPY WITH ANTERIOR CRUCIATE LIGAMENT (ACL) REPAIR Right 1994   KNEE ARTHROSCOPY WITH MEDIAL MENISECTOMY Left 12/28/2018   Procedure: KNEE ARTHROSCOPY WITH partial MEDIAL MENISECTOMY, debridement chondroplasty;  Surgeon: Sydnee Cabal, MD;  Location: [facility];  Service: Orthopedics;  Laterality: Left;  with  knee block   WISDOM TOOTH EXTRACTION  age 78    There were no vitals filed for this visit.   Subjective Assessment - 06/26/21 0845     Subjective Could not tell a difference with the traction. Manual work seems to work better. Stopped the gabopentin which has not made a difference in symptoms. Thumb is about the same. Numbness is better than it was a couple of weeks ago.    Currently in Pain? No/denies    Pain Score 0-No pain    Pain Location Shoulder    Pain Orientation Right    Pain Descriptors / Indicators Sore    Pain Type Acute pain;Chronic pain    Aggravating Factors  lying on the Lt side; work    Pain Relieving Factors ice; heat; manual work                               [employer] Adult PT Treatment/Exercise - 06/26/21 0001       Neck Exercises: Machines for Strengthening   UBE (Upper Arm Bike) L4 x 4 min alt fwd/bkwd      Neck Exercises: Standing   Other Standing Exercises scap squeeze 10 sec x 10 foam roll along spine  Neck Exercises: Seated   Shoulder Rolls Backwards;10 reps      Shoulder Exercises: Stretch   Other Shoulder Stretches doorway stretch 3 positions 30 sec hold x 2 reps each position      Moist Heat Therapy   Number Minutes Moist Heat 10 Minutes    Moist Heat Location Shoulder      Electrical Stimulation   Electrical Stimulation Location Lt cervical and shoulder    Electrical Stimulation Action TENS    Electrical Stimulation Parameters to tolerance    Electrical Stimulation Goals Tone;Pain      Manual Therapy   Manual therapy comments skilled palpation to assess effects of dry needling    Soft tissue mobilization deep tissue work through the anterior/lateral cervical musculature and pecs/upper trap    Myofascial Release anterior chest    Passive ROM Lt shoulder extension and horizontal abdcution pt supine UE off edge of table              Trigger Point Dry Needling - 06/26/21 0001     Consent Given? Yes    Education  Handout Provided Previously provided    Sternocleidomastoid Response Palpable increased muscle length;Twitch response elicited    Upper Trapezius Response Palpable increased muscle length;Twitch reponse elicited    Scalenes Response Palpable increased muscle length;Twitch reponse elicited    Pectoralis Major Response Palpable increased muscle length;Twitch response elicited    Pectoralis Minor Response Palpable increased muscle length;Twitch response elicited                        PT Long Term Goals - 06/10/21 1048       PT LONG TERM GOAL #1   Title The patinet will be indep with HEP.    Time 6    Period Weeks    Target Date 07/21/21      PT LONG TERM GOAL #2   Title The patient will imrpove functional status score from 57% up to 71% to demo dec'd limitation.    Time 6    Period Weeks    Target Date 07/21/21      PT LONG TERM GOAL #3   Title The patient will report resolution of R thumb numbness and tingling.    Time 6    Period Weeks    Target Date 07/21/21      PT LONG TERM GOAL #4   Title The patient will tolerate laying on his left side without increased pain.    Time 6    Period Weeks    Target Date 07/21/21      PT LONG TERM GOAL #5   Title The patient will demo improving posture and verbalize strategies to improve mechanics in work environment.    Time 6    Period Weeks    Target Date 07/21/21                   Plan - 06/26/21 0851     Clinical Impression Statement Patient reports gradual improvement in symptoms including numbness in the Thumb. The clavical continues to be sore more distally than toward the sternum. No significant change with mechanical traction.    PT Frequency 2x / week    PT Duration 6 weeks    PT Treatment/Interventions Taping;Dry needling;ADLs/Self Care Home Management;Patient/family education;Cryotherapy;Electrical Stimulation;Moist Heat;Traction;Therapeutic exercise;Therapeutic activities;Manual techniques;Passive  range of motion    PT Next Visit Plan postural strength and stretching, progress HEP, manual as indicated  PT Home Exercise Plan [id number]    Consulted and Agree with Plan of Care Patient             Patient will benefit from skilled therapeutic intervention in order to improve the following deficits and impairments:     Visit Diagnosis: Cervicalgia  Abnormal posture     Problem List Patient Active Problem List   Diagnosis Date Noted   Aortic atherosclerosis ([place]), Cardiac CT 02/27/20 03/04/2020   Esophageal thickening, Cardiac CT 02/27/20 03/04/2020   Abnormal screening cardiac CT, 02/27/20, score 336, 89th% 03/04/2020   Visceral obesity 03/04/2020   Mixed hyperlipidemia, at goal, Rx Crestor 32/41/9914   Metabolic syndrome 44/58/4835   Vitamin D deficiency, at goal with supplementation 11/13/2019   B12 deficiency, at goal with supplementation 11/13/2019   Prediabetes 11/13/2019   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   Low back pain 04/20/2018   Class 1 obesity with serious comorbidity and body mass index (BMI) of 32.0 to 32.9 in adult 08/05/2017   OSA on CPAP 04/05/2017   Ulcerative colitis ([place]) 11/04/2015   Adjustment insomnia 11/04/2015   Allergic rhinitis 11/03/2015   S/P right knee arthroscopy 03/29/2014    David Newman, PT, MPH  06/26/2021, 10:55 AM  [facility] [place], [place], [address] Phone: [phone]   Fax:  [phone]  Name: David Newman MRN: [id number] Date of Birth: Dec 07, 1960

## 2021-06-30 ENCOUNTER — Ambulatory Visit: Payer: 59 | Admitting: Physical Therapy

## 2021-06-30 ENCOUNTER — Other Ambulatory Visit: Payer: Self-pay

## 2021-06-30 DIAGNOSIS — M542 Cervicalgia: Secondary | ICD-10-CM | POA: Diagnosis not present

## 2021-06-30 DIAGNOSIS — R293 Abnormal posture: Secondary | ICD-10-CM

## 2021-06-30 NOTE — Therapy (Signed)
[place], [place], [address] Phone: [phone]   Fax:  [phone]  Physical Therapy Treatment  Patient Details  Name: David Newman MRN: [id number] Date of Birth: [birth year] Referring Provider (PT): Lynne Leader, MD   Encounter Date: 06/30/2021   PT End of Session - 06/30/21 0928     Visit Number 7    Number of Visits 12    Date for PT Re-Evaluation 08/10/21    PT Start Time 0840    PT Stop Time 0928    PT Time Calculation (min) 48 min    Activity Tolerance Patient tolerated treatment well    Behavior During Therapy [facility] for tasks assessed/performed             Past Medical History:  Diagnosis Date   Anal fissure    At risk for sleep apnea    STOP-BANG= 5    SENT TO PCP 03-27-2014   Back pain    CAD (coronary artery disease)    Colon polyps    hyperplastic   DDD (degenerative disc disease), lumbar    GERD (gastroesophageal reflux disease)    Gout    High blood pressure    Hyperlipidemia    Joint pain    Knee pain    OA (osteoarthritis) of knee    Prediabetes    Right knee meniscal tear    Sleep apnea    Wears CPAP.   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[place])    Wears contact lenses    Wears glasses     Past Surgical History:  Procedure Laterality Date   COLONOSCOPY  04-24-2012   FACIAL RECONSTRUCTION SURGERY  1973  age 69   KNEE ARTHROSCOPY Right 03/29/2014   Procedure: RIGHT ARTHROSCOPY KNEE WITH DEBRIDMENT PARTIAL medial lateral MENISCECTOMY AND CHONDROPLASTY;  Surgeon: Sydnee Cabal, MD;  Location: [facility];  Service: Orthopedics;  Laterality: Right;   KNEE ARTHROSCOPY WITH ANTERIOR CRUCIATE LIGAMENT (ACL) REPAIR Right 1994   KNEE ARTHROSCOPY WITH MEDIAL MENISECTOMY Left 12/28/2018   Procedure: KNEE ARTHROSCOPY WITH partial MEDIAL MENISECTOMY, debridement chondroplasty;  Surgeon: Sydnee Cabal, MD;  Location: [facility];  Service:  Orthopedics;  Laterality: Left;  with knee block   WISDOM TOOTH EXTRACTION  age 57    There were no vitals filed for this visit.   Subjective Assessment - 06/30/21 0841     Subjective Pt states he feels his neck is "loosening up" a little bit    Currently in Pain? No/denies                [facility] PT Assessment - 06/30/21 0001       Assessment   Referring Provider (PT) Lynne Leader, MD    Onset Date/Surgical Date 04/27/21    Hand Dominance Right                           [facility] Adult PT Treatment/Exercise - 06/30/21 0001       Neck Exercises: Machines for Strengthening   UBE (Upper Arm Bike) L4 x 4 min alt fwd/bkwd      Neck Exercises: Supine   Other Supine Exercise laying on ball bear hugs, alt shoulder flexion, snow angels      Neck Exercises: Stretches   Other Neck Stretches seated lateral trunk flexion with UEs on physioball      Shoulder Exercises: Prone   Other Prone Exercises prone over physioball Y,  T x 10      Shoulder Exercises: Standing   Extension Strengthening;10 reps    Theraband Level (Shoulder Extension) Level 4 (Blue)    Extension Limitations isometric holds x 10 sec    Row Strengthening;10 reps    Theraband Level (Shoulder Row) Level 4 (Blue)    Row Limitations isometric hold x 10 sec      Manual Therapy   Soft tissue mobilization upper traps, cervical paraspinals, suboccipitals    Manual Traction manual traction 30 sec on 15 sec off x 8                          PT Long Term Goals - 06/10/21 1048       PT LONG TERM GOAL #1   Title The patinet will be indep with HEP.    Time 6    Period Weeks    Target Date 07/21/21      PT LONG TERM GOAL #2   Title The patient will imrpove functional status score from 57% up to 71% to demo dec'd limitation.    Time 6    Period Weeks    Target Date 07/21/21      PT LONG TERM GOAL #3   Title The patient will report resolution of R thumb numbness and tingling.    Time 6     Period Weeks    Target Date 07/21/21      PT LONG TERM GOAL #4   Title The patient will tolerate laying on his left side without increased pain.    Time 6    Period Weeks    Target Date 07/21/21      PT LONG TERM GOAL #5   Title The patient will demo improving posture and verbalize strategies to improve mechanics in work environment.    Time 6    Period Weeks    Target Date 07/21/21                   Plan - 06/30/21 0174     Clinical Impression Statement Pt continues to report gradual improvement in symptoms. Added prone over ball strengthening and manual traction this visit    PT Next Visit Plan progress HEP, postural strength and stretching    PT Home Exercise Plan [email or web address]    Consulted and Agree with Plan of Care Patient             Patient will benefit from skilled therapeutic intervention in order to improve the following deficits and impairments:     Visit Diagnosis: Cervicalgia  Abnormal posture     Problem List Patient Active Problem List   Diagnosis Date Noted   Aortic atherosclerosis ([place]), Cardiac CT 02/27/20 03/04/2020   Esophageal thickening, Cardiac CT 02/27/20 03/04/2020   Abnormal screening cardiac CT, 02/27/20, score 336, 89th% 03/04/2020   Visceral obesity 03/04/2020   Mixed hyperlipidemia, at goal, Rx Crestor 94/49/6759   Metabolic syndrome 16/38/4665   Vitamin D deficiency, at goal with supplementation 11/13/2019   B12 deficiency, at goal with supplementation 11/13/2019   Prediabetes 11/13/2019   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   Low back pain 04/20/2018   Class 1 obesity with serious comorbidity and body mass index (BMI) of 32.0 to 32.9 in adult 08/05/2017   OSA on CPAP 04/05/2017   Ulcerative colitis ([place]) 11/04/2015   Adjustment insomnia 11/04/2015   Allergic rhinitis 11/03/2015   S/P right knee arthroscopy 03/29/2014  David Newman, PT 06/30/2021, 9:30 AM  [facility] [place], [place], [address] Phone: [phone]   Fax:  [phone]  Name: David Newman MRN: [id number] Date of Birth: November 06, 1960

## 2021-07-03 ENCOUNTER — Ambulatory Visit: Payer: 59 | Admitting: Rehabilitative and Restorative Service Providers"

## 2021-07-03 ENCOUNTER — Other Ambulatory Visit: Payer: Self-pay

## 2021-07-03 ENCOUNTER — Encounter: Payer: Self-pay | Admitting: Rehabilitative and Restorative Service Providers"

## 2021-07-03 DIAGNOSIS — M542 Cervicalgia: Secondary | ICD-10-CM | POA: Diagnosis not present

## 2021-07-03 DIAGNOSIS — R293 Abnormal posture: Secondary | ICD-10-CM | POA: Diagnosis not present

## 2021-07-03 NOTE — Therapy (Signed)
[name], [place], [address] Phone: [phone]   Fax:  [phone]  Physical Therapy Treatment  Patient Details  Name: David Newman MRN: [id number] Date of Birth: 09-Oct-1960 Referring Provider (PT): Lynne Leader, MD   Encounter Date: 07/03/2021   PT End of Session - 07/03/21 0854     Visit Number 8    Number of Visits 12    Date for PT Re-Evaluation 08/10/21    Authorization Type UMR    PT Start Time 0852    PT Stop Time 0945    PT Time Calculation (min) 53 min    Activity Tolerance Patient tolerated treatment well    Behavior During Therapy [facility] for tasks assessed/performed             Past Medical History:  Diagnosis Date   Anal fissure    At risk for sleep apnea    STOP-BANG= 5    SENT TO PCP 03-27-2014   Back pain    CAD (coronary artery disease)    Colon polyps    hyperplastic   DDD (degenerative disc disease), lumbar    GERD (gastroesophageal reflux disease)    Gout    High blood pressure    Hyperlipidemia    Joint pain    Knee pain    OA (osteoarthritis) of knee    Prediabetes    Right knee meniscal tear    Sleep apnea    Wears CPAP.   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[place])    Wears contact lenses    Wears glasses     Past Surgical History:  Procedure Laterality Date   COLONOSCOPY  04-24-2012   FACIAL RECONSTRUCTION SURGERY  1973  age 61   KNEE ARTHROSCOPY Right 03/29/2014   Procedure: RIGHT ARTHROSCOPY KNEE WITH DEBRIDMENT PARTIAL medial lateral MENISCECTOMY AND CHONDROPLASTY;  Surgeon: Sydnee Cabal, MD;  Location: [facility];  Service: Orthopedics;  Laterality: Right;   KNEE ARTHROSCOPY WITH ANTERIOR CRUCIATE LIGAMENT (ACL) REPAIR Right 1994   KNEE ARTHROSCOPY WITH MEDIAL MENISECTOMY Left 12/28/2018   Procedure: KNEE ARTHROSCOPY WITH partial MEDIAL MENISECTOMY, debridement chondroplasty;  Surgeon: Sydnee Cabal, MD;  Location: [facility];  Service: Orthopedics;  Laterality: Left;  with knee block   WISDOM TOOTH EXTRACTION  age 61    There were no vitals filed for this visit.   Subjective Assessment - 07/03/21 0853     Subjective The patient notes definitely less stiff in his neck.  He may have increased tightness on the R as compared to the left.    Diagnostic tests Degenerative disc and facet disease. Multilevel bilateral neural  foraminal narrowing. No acute bony abnormality.    Patient Stated Goals Decrease tightness in neck/shoulder, decrease in numbness and tingling in the R thumb.    Currently in Pain? No/denies                [facility] PT Assessment - 07/03/21 0856       Assessment   Medical Diagnosis M25.512 (ICD-10-CM) - Left shoulder pain, unspecified chronicity  M54.2 (ICD-10-CM) - Neck pain on left side    Referring Provider (PT) Lynne Leader, MD    Onset Date/Surgical Date 04/27/21                           [facility] Adult PT Treatment/Exercise - 07/03/21 0856       Exercises   Exercises Shoulder;Neck;Lumbar  Neck Exercises: Machines for Strengthening   UBE (Upper Arm Bike) UBE L 3 x 2.5 forward, 2 minutes backwards      Neck Exercises: Prone   W Back 10 reps    Other Prone Exercise Is and Ys x 10 reps prone scapular stability.  Unable to perform I's due to end range tightness bilateral shoulders      Lumbar Exercises: Quadruped   Madcat/Old Horse 10 reps    Madcat/Old Horse Limitations cues for positioning    Single Arm Raise Right;Left;10 reps      Shoulder Exercises: Stretch   Other Shoulder Stretches T stretch supine      Moist Heat Therapy   Number Minutes Moist Heat 10 Minutes    Moist Heat Location Cervical;Shoulder      Electrical Stimulation   Electrical Stimulation Location bilateral upper traps regions    Electrical Stimulation Action TENS    Electrical Stimulation Parameters to tolerance    Electrical Stimulation Goals Tone;Pain      Manual  Therapy   Manual Therapy Soft tissue mobilization    Manual therapy comments skilled palpation to assess effects of dry needling    Soft tissue mobilization bilateral upper trap, bilateral suboccipitals, bilat levator              Trigger Point Dry Needling - 07/03/21 1409     Consent Given? Yes    Education Handout Provided Previously provided    Muscles Treated Head and Neck Upper trapezius;Levator scapulae    Upper Trapezius Response Twitch reponse elicited;Palpable increased muscle length    Levator Scapulae Response Twitch response elicited;Palpable increased muscle length                        PT Long Term Goals - 06/10/21 1048       PT LONG TERM GOAL #1   Title The patinet will be indep with HEP.    Time 6    Period Weeks    Target Date 07/21/21      PT LONG TERM GOAL #2   Title The patient will imrpove functional status score from 57% up to 71% to demo dec'd limitation.    Time 6    Period Weeks    Target Date 07/21/21      PT LONG TERM GOAL #3   Title The patient will report resolution of R thumb numbness and tingling.    Time 6    Period Weeks    Target Date 07/21/21      PT LONG TERM GOAL #4   Title The patient will tolerate laying on his left side without increased pain.    Time 6    Period Weeks    Target Date 07/21/21      PT LONG TERM GOAL #5   Title The patient will demo improving posture and verbalize strategies to improve mechanics in work environment.    Time 6    Period Weeks    Target Date 07/21/21                   Plan - 07/03/21 1405     Clinical Impression Statement The patient is continuing to progress with neck ROM and reduced tightness and pain.  He does still note occasional R thumb numbness.  PT progressed ther exercise today and continued with STM and DN for muscle lengthening.  Continue working to [employer].    PT Treatment/Interventions Taping;Dry needling;ADLs/Self Care Home Management;Patient/family  education;Cryotherapy;Electrical Stimulation;Moist Heat;Traction;Therapeutic exercise;Therapeutic activities;Manual techniques;Passive range of motion    PT Next Visit Plan progress HEP, postural strength and stretching, begin checking LTGs    PT Home Exercise Plan [email or web address]    Consulted and Agree with Plan of Care Patient             Patient will benefit from skilled therapeutic intervention in order to improve the following deficits and impairments:     Visit Diagnosis: Cervicalgia  Abnormal posture     Problem List Patient Active Problem List   Diagnosis Date Noted   Aortic atherosclerosis ([place]), Cardiac CT 02/27/20 03/04/2020   Esophageal thickening, Cardiac CT 02/27/20 03/04/2020   Abnormal screening cardiac CT, 02/27/20, score 336, 89th% 03/04/2020   Visceral obesity 03/04/2020   Mixed hyperlipidemia, at goal, Rx Crestor 41/28/7867   Metabolic syndrome 67/20/9470   Vitamin D deficiency, at goal with supplementation 11/13/2019   B12 deficiency, at goal with supplementation 11/13/2019   Prediabetes 11/13/2019   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   Low back pain 04/20/2018   Class 1 obesity with serious comorbidity and body mass index (BMI) of 32.0 to 32.9 in adult 08/05/2017   OSA on CPAP 04/05/2017   Ulcerative colitis ([place]) 11/04/2015   Adjustment insomnia 11/04/2015   Allergic rhinitis 11/03/2015   S/P right knee arthroscopy 03/29/2014    Dannis Deroche, PT 07/03/2021, 2:10 PM  [facility] [place], [place], [address] Phone: [phone]   Fax:  [phone]  Name: MARICUS TANZI MRN: [id number] Date of Birth: 04/20/1961

## 2021-07-06 DIAGNOSIS — H2513 Age-related nuclear cataract, bilateral: Secondary | ICD-10-CM | POA: Diagnosis not present

## 2021-07-06 DIAGNOSIS — H524 Presbyopia: Secondary | ICD-10-CM | POA: Diagnosis not present

## 2021-07-06 DIAGNOSIS — R7303 Prediabetes: Secondary | ICD-10-CM | POA: Diagnosis not present

## 2021-07-07 ENCOUNTER — Encounter: Payer: Self-pay | Admitting: Rehabilitative and Restorative Service Providers"

## 2021-07-07 ENCOUNTER — Ambulatory Visit: Payer: 59 | Admitting: Rehabilitative and Restorative Service Providers"

## 2021-07-07 ENCOUNTER — Other Ambulatory Visit: Payer: Self-pay

## 2021-07-07 DIAGNOSIS — M542 Cervicalgia: Secondary | ICD-10-CM | POA: Diagnosis not present

## 2021-07-07 DIAGNOSIS — R293 Abnormal posture: Secondary | ICD-10-CM

## 2021-07-07 NOTE — Patient Instructions (Signed)
Access Code: [email or web address] URL: [email or web address] Date: 07/07/2021 Prepared by: Gillermo Murdoch  Exercises Seated Cervical Retraction - 2 x daily - 7 x weekly - 1-2 sets - 5-10 reps - 10 sec hold Seated Scapular Retraction - 2 x daily - 7 x weekly - 1-2 sets - 10 reps - 10 sec hold Shoulder External Rotation and Scapular Retraction - 2 x daily - 7 x weekly - 1 sets - 10 reps - 5 sec hold Shoulder External Rotation in 45 Degrees Abduction - 2 x daily - 7 x weekly - 1-2 sets - 10 reps - 3 sec hold Shoulder External Rotation and Scapular Retraction with Resistance - 2 x daily - 7 x weekly - 1 sets - 10 reps - 3-5 sec hold Standing Shoulder Row Reactive Isometric - 2 x daily - 7 x weekly - 1 sets - 10 reps - 3-5 sec hold Doorway Pec Stretch at 60 Degrees Abduction - 3 x daily - 7 x weekly - 1 sets - 3 reps Doorway Pec Stretch at 90 Degrees Abduction - 3 x daily - 7 x weekly - 1 sets - 3 reps - 30 seconds hold Doorway Pec Stretch at 120 Degrees Abduction - 3 x daily - 7 x weekly - 1 sets - 3 reps - 30 second hold hold Standing Shoulder Flexion Wall Walk - 2 x daily - 7 x weekly - 1 sets - 3 reps - 30 sec hold Seated Thoracic Lumbar Extension with Pectoralis Stretch - 2 x daily - 7 x weekly - 1 sets - 5-8 reps - 10 sec hold Prone Scapular Retraction - 2 x daily - 7 x weekly - 1 sets - 5-10 reps - 3-5 sec hold Prone W Scapular Retraction - 2 x daily - 7 x weekly - 1 sets - 5-10 reps - 3-5 sec hold

## 2021-07-07 NOTE — Therapy (Signed)
[place], [place], [address] Phone: [phone]   Fax:  [phone]  Physical Therapy Treatment  Patient Details  Name: David Newman MRN: [id number] Date of Birth: 11/20/1960 Referring Provider (PT): Lynne Leader, MD   Encounter Date: 07/07/2021   PT End of Session - 07/07/21 0802     Visit Number 9    Number of Visits 12    Date for PT Re-Evaluation 08/10/21    Authorization Type UMR    PT Start Time 0800    PT Stop Time 0850    PT Time Calculation (min) 50 min    Activity Tolerance Patient tolerated treatment well             Past Medical History:  Diagnosis Date   Anal fissure    At risk for sleep apnea    STOP-BANG= 5    SENT TO PCP 03-27-2014   Back pain    CAD (coronary artery disease)    Colon polyps    hyperplastic   DDD (degenerative disc disease), lumbar    GERD (gastroesophageal reflux disease)    Gout    High blood pressure    Hyperlipidemia    Joint pain    Knee pain    OA (osteoarthritis) of knee    Prediabetes    Right knee meniscal tear    Sleep apnea    Wears CPAP.   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[place])    Wears contact lenses    Wears glasses     Past Surgical History:  Procedure Laterality Date   COLONOSCOPY  04-24-2012   FACIAL RECONSTRUCTION SURGERY  1973  age 45   KNEE ARTHROSCOPY Right 03/29/2014   Procedure: RIGHT ARTHROSCOPY KNEE WITH DEBRIDMENT PARTIAL medial lateral MENISCECTOMY AND CHONDROPLASTY;  Surgeon: Sydnee Cabal, MD;  Location: [facility];  Service: Orthopedics;  Laterality: Right;   KNEE ARTHROSCOPY WITH ANTERIOR CRUCIATE LIGAMENT (ACL) REPAIR Right 1994   KNEE ARTHROSCOPY WITH MEDIAL MENISECTOMY Left 12/28/2018   Procedure: KNEE ARTHROSCOPY WITH partial MEDIAL MENISECTOMY, debridement chondroplasty;  Surgeon: Sydnee Cabal, MD;  Location: [facility];  Service: Orthopedics;  Laterality: Left;  with  knee block   WISDOM TOOTH EXTRACTION  age 61    There were no vitals filed for this visit.   Subjective Assessment - 07/07/21 0802     Subjective Symptoms seem to be improving. He is working on exercises at least once a day.    Currently in Pain? No/denies    Pain Score 0-No pain                [facility] PT Assessment - 07/07/21 0001       Assessment   Medical Diagnosis M25.512 (ICD-10-CM) - Left shoulder pain, unspecified chronicity  M54.2 (ICD-10-CM) - Neck pain on left side    Referring Provider (PT) Lynne Leader, MD    Onset Date/Surgical Date 04/27/21      AROM   Cervical Flexion 52    Cervical Extension 54    Cervical - Right Side Bend 27    Cervical - Left Side Bend 32    Cervical - Right Rotation 58    Cervical - Left Rotation 60                           [facility] Adult PT Treatment/Exercise - 07/07/21 0001       Neck Exercises: Machines for Strengthening  UBE (Upper Arm Bike) UBE L 4 x x 4 min 2 forward, 2 minutes backwards      Neck Exercises: Seated   Other Seated Exercise thoracic extension seated with coregeous ball at T-spine; added shoudler elevation x 3-4 reps; trial of thoracic rotation 10-15 sec hold x 2 reps each side      Neck Exercises: Prone   Other Prone Exercise prone scap squeeze with axial extension 5 sec hold x 5 reps; W's x 5 reps      Shoulder Exercises: Standing   Extension Strengthening;10 reps    Theraband Level (Shoulder Extension) Level 4 (Blue)    Extension Limitations isometric holds x 10 sec    Row Strengthening;10 reps    Theraband Level (Shoulder Row) Level 4 (Blue)    Row Limitations isometric hold x 10 sec      Shoulder Exercises: Stretch   Wall Stretch - Flexion Limitations shoulder flexion hands on top of doorway 30 sec x 2 reps    Other Shoulder Stretches doorway stretch 3 positons x 30 sec hold x 2 reps each position stretching pecs      Moist Heat Therapy   Number Minutes Moist Heat 10 Minutes     Moist Heat Location Cervical;Shoulder      Electrical Stimulation   Electrical Stimulation Location Lt upper trap; Lt pecs/anterior shoulder    Recruitment consultant Parameters to tolerance    Electrical Stimulation Goals Tone;Pain      Manual Therapy   Joint Mobilization thoracic PA and lateral mobs pt prone    Soft tissue mobilization STM through the thoracic and upper trap musculature as well as through the pecs and upper traps pt prone and supine    Myofascial Release anterior chest/shoulder    Other Manual Therapy rib springing pt prone and supine to improve mobilty through thoracic wall/ribs note increased tightness Lt compared to Rt                     PT Education - 07/07/21 0829     Education Details HEP    Person(s) Educated Patient    Methods Explanation;Demonstration;Tactile cues;Verbal cues;Handout    Comprehension Verbalized understanding;Returned demonstration;Verbal cues required;Tactile cues required                 PT Long Term Goals - 06/10/21 1048       PT LONG TERM GOAL #1   Title The patinet will be indep with HEP.    Time 6    Period Weeks    Target Date 07/21/21      PT LONG TERM GOAL #2   Title The patient will imrpove functional status score from 57% up to 71% to demo dec'd limitation.    Time 6    Period Weeks    Target Date 07/21/21      PT LONG TERM GOAL #3   Title The patient will report resolution of R thumb numbness and tingling.    Time 6    Period Weeks    Target Date 07/21/21      PT LONG TERM GOAL #4   Title The patient will tolerate laying on his left side without increased pain.    Time 6    Period Weeks    Target Date 07/21/21      PT LONG TERM GOAL #5   Title The patient will demo improving posture and verbalize strategies to improve mechanics in  work environment.    Time 6    Period Weeks    Target Date 07/21/21                   Plan - 07/07/21 0814      Clinical Impression Statement Patient reports continued improvement. He demonstrates increased cervical mobility and ROM but continued tightness in the thoracic spine with increased thoracic kyphosis. Working on thoracic mobility. Continued with manual work through the thoracic and cervical musculature.    Rehab Potential Good    PT Frequency 2x / week    PT Duration 6 weeks    PT Treatment/Interventions Taping;Dry needling;ADLs/Self Care Home Management;Patient/family education;Cryotherapy;Electrical Stimulation;Moist Heat;Traction;Therapeutic exercise;Therapeutic activities;Manual techniques;Passive range of motion    PT Next Visit Plan progress HEP, postural strength and stretching, begin checking LTGs    PT Home Exercise Plan [id number]    Consulted and Agree with Plan of Care Patient             Patient will benefit from skilled therapeutic intervention in order to improve the following deficits and impairments:     Visit Diagnosis: Cervicalgia  Abnormal posture     Problem List Patient Active Problem List   Diagnosis Date Noted   Aortic atherosclerosis ([place]), Cardiac CT 02/27/20 03/04/2020   Esophageal thickening, Cardiac CT 02/27/20 03/04/2020   Abnormal screening cardiac CT, 02/27/20, score 336, 89th% 03/04/2020   Visceral obesity 03/04/2020   Mixed hyperlipidemia, at goal, Rx Crestor 25/75/0518   Metabolic syndrome 33/58/2518   Vitamin D deficiency, at goal with supplementation 11/13/2019   B12 deficiency, at goal with supplementation 11/13/2019   Prediabetes 11/13/2019   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   Low back pain 04/20/2018   Class 1 obesity with serious comorbidity and body mass index (BMI) of 32.0 to 32.9 in adult 08/05/2017   OSA on CPAP 04/05/2017   Ulcerative colitis ([place]) 11/04/2015   Adjustment insomnia 11/04/2015   Allergic rhinitis 11/03/2015   S/P right knee arthroscopy 03/29/2014    Crystina Borrayo Nilda Simmer, PT, MPH 07/07/2021, 10:39  AM  [facility] [place], [place], [address] Phone: [phone]   Fax:  [phone]  Name: David Newman MRN: [id number] Date of Birth: 02-14-1961

## 2021-07-09 ENCOUNTER — Ambulatory Visit: Payer: 59 | Admitting: Family Medicine

## 2021-07-09 ENCOUNTER — Other Ambulatory Visit: Payer: Self-pay

## 2021-07-09 ENCOUNTER — Ambulatory Visit: Payer: 59 | Admitting: Rehabilitative and Restorative Service Providers"

## 2021-07-09 ENCOUNTER — Encounter: Payer: Self-pay | Admitting: Rehabilitative and Restorative Service Providers"

## 2021-07-09 VITALS — BP 152/102 | HR 70 | Ht 71.0 in | Wt 239.2 lb

## 2021-07-09 DIAGNOSIS — M542 Cervicalgia: Secondary | ICD-10-CM

## 2021-07-09 DIAGNOSIS — R202 Paresthesia of skin: Secondary | ICD-10-CM

## 2021-07-09 DIAGNOSIS — R293 Abnormal posture: Secondary | ICD-10-CM

## 2021-07-09 NOTE — Therapy (Signed)
[place], [place], [address] Phone: [phone]   Fax:  [phone]  Physical Therapy Treatment  Patient Details  Name: David Newman MRN: [id number] Date of Birth: 1961/04/24 Referring Provider (PT): Lynne Leader, MD   Encounter Date: 07/09/2021   PT End of Session - 07/09/21 0933     Visit Number 10    Number of Visits 12    Date for PT Re-Evaluation 08/10/21    Authorization Type UMR    PT Start Time 0931    PT Stop Time 1020    PT Time Calculation (min) 49 min    Activity Tolerance Patient tolerated treatment well             Past Medical History:  Diagnosis Date   Anal fissure    At risk for sleep apnea    STOP-BANG= 5    SENT TO PCP 03-27-2014   Back pain    CAD (coronary artery disease)    Colon polyps    hyperplastic   DDD (degenerative disc disease), lumbar    GERD (gastroesophageal reflux disease)    Gout    High blood pressure    Hyperlipidemia    Joint pain    Knee pain    OA (osteoarthritis) of knee    Prediabetes    Right knee meniscal tear    Sleep apnea    Wears CPAP.   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[place])    Wears contact lenses    Wears glasses     Past Surgical History:  Procedure Laterality Date   COLONOSCOPY  04-24-2012   FACIAL RECONSTRUCTION SURGERY  1973  age 25   KNEE ARTHROSCOPY Right 03/29/2014   Procedure: RIGHT ARTHROSCOPY KNEE WITH DEBRIDMENT PARTIAL medial lateral MENISCECTOMY AND CHONDROPLASTY;  Surgeon: Sydnee Cabal, MD;  Location: [facility];  Service: Orthopedics;  Laterality: Right;   KNEE ARTHROSCOPY WITH ANTERIOR CRUCIATE LIGAMENT (ACL) REPAIR Right 1994   KNEE ARTHROSCOPY WITH MEDIAL MENISECTOMY Left 12/28/2018   Procedure: KNEE ARTHROSCOPY WITH partial MEDIAL MENISECTOMY, debridement chondroplasty;  Surgeon: Sydnee Cabal, MD;  Location: [facility];  Service: Orthopedics;  Laterality: Left;  with  knee block   WISDOM TOOTH EXTRACTION  age 89    There were no vitals filed for this visit.   Subjective Assessment - 07/09/21 0934     Subjective Did not sleep well last night.    Currently in Pain? No/denies    Pain Score 0-No pain    Pain Location Shoulder    Pain Orientation Left    Pain Descriptors / Indicators Tightness    Pain Type Acute pain;Chronic pain                               [facility] Adult PT Treatment/Exercise - 07/09/21 0001       Neck Exercises: Machines for Strengthening   UBE (Upper Arm Bike) UBE L 4 x x 4 min 2 forward, 2 minutes backwards      Neck Exercises: Seated   Other Seated Exercise thoracic extension seated with coregeous ball at T-spine      Shoulder Exercises: Stretch   Wall Stretch - Flexion Limitations shoulder flexion hands on top of doorway 30 sec x 2 reps    Other Shoulder Stretches doorway stretch 3 positons x 30 sec hold x 2 reps each position stretching pecs      Moist  Heat Therapy   Number Minutes Moist Heat 10 Minutes    Moist Heat Location Cervical;Shoulder      Electrical Stimulation   Electrical Stimulation Location Lt upper trap; Lt pecs/anterior shoulder    Recruitment consultant Parameters to tolerance    Electrical Stimulation Goals Tone;Pain      Manual Therapy   Manual therapy comments skilled palpation to assess effects of dry needling    Soft tissue mobilization STM through the thoracic and upper trap musculature as well as through the pecs and upper traps pt supine    Myofascial Release anterior chest/shoulder    Other Manual Therapy rib springing pt prone and supine to improve mobilty through thoracic wall/ribs note increased tightness Lt compared to Rt              Trigger Point Dry Needling - 07/09/21 0001     Consent Given? Yes    Education Handout Provided Previously provided    Pectoralis Major Response Palpable increased muscle length;Twitch  response elicited    Pectoralis Minor Response Palpable increased muscle length;Twitch response elicited                        PT Long Term Goals - 06/10/21 1048       PT LONG TERM GOAL #1   Title The patinet will be indep with HEP.    Time 6    Period Weeks    Target Date 07/21/21      PT LONG TERM GOAL #2   Title The patient will imrpove functional status score from 57% up to 71% to demo dec'd limitation.    Time 6    Period Weeks    Target Date 07/21/21      PT LONG TERM GOAL #3   Title The patient will report resolution of R thumb numbness and tingling.    Time 6    Period Weeks    Target Date 07/21/21      PT LONG TERM GOAL #4   Title The patient will tolerate laying on his left side without increased pain.    Time 6    Period Weeks    Target Date 07/21/21      PT LONG TERM GOAL #5   Title The patient will demo improving posture and verbalize strategies to improve mechanics in work environment.    Time 6    Period Weeks    Target Date 07/21/21                   Plan - 07/09/21 0935     Clinical Impression Statement No increased pain; continued tightness through the Pt thoracic area including pecs and chest wall. Continued work on thoracic mobility; DN; manual work. Gradual progress continues.    Rehab Potential Good    PT Frequency 2x / week    PT Duration 6 weeks    PT Treatment/Interventions Taping;Dry needling;ADLs/Self Care Home Management;Patient/family education;Cryotherapy;Electrical Stimulation;Moist Heat;Traction;Therapeutic exercise;Therapeutic activities;Manual techniques;Passive range of motion    PT Next Visit Plan progress HEP, postural strength and stretching, DN/manual work    PT Home Exercise Plan [id number]    Consulted and Agree with Plan of Care Patient             Patient will benefit from skilled therapeutic intervention in order to improve the following deficits and impairments:     Visit  Diagnosis: Cervicalgia  Abnormal posture  Problem List Patient Active Problem List   Diagnosis Date Noted   Aortic atherosclerosis ([place]), Cardiac CT 02/27/20 03/04/2020   Esophageal thickening, Cardiac CT 02/27/20 03/04/2020   Abnormal screening cardiac CT, 02/27/20, score 336, 89th% 03/04/2020   Visceral obesity 03/04/2020   Mixed hyperlipidemia, at goal, Rx Crestor 17/61/6073   Metabolic syndrome 71/10/2692   Vitamin D deficiency, at goal with supplementation 11/13/2019   B12 deficiency, at goal with supplementation 11/13/2019   Prediabetes 11/13/2019   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   Low back pain 04/20/2018   Class 1 obesity with serious comorbidity and body mass index (BMI) of 32.0 to 32.9 in adult 08/05/2017   OSA on CPAP 04/05/2017   Ulcerative colitis ([place]) 11/04/2015   Adjustment insomnia 11/04/2015   Allergic rhinitis 11/03/2015   S/P right knee arthroscopy 03/29/2014    Nidhi Jacome Nilda Simmer, PT,MPH 07/09/2021, 10:17 AM  [facility] [place], [place], [address] Phone: [phone]   Fax:  [phone]  Name: David Newman MRN: [id number] Date of Birth: 12/15/1960

## 2021-07-09 NOTE — Progress Notes (Signed)
° °  I, David Newman, LAT, ATC acting as a scribe for David Leader, MD.  David Newman is a 61 y.o. male who presents to [facility] today for f/u neck and L shoulder pain and R hand paresthesia ongoing since 12/12 after suffering a fall while at [facility]. Pt was last seen by Dr. Georgina Newman on 05/27/21 and was prescribed gabapentin and referred to PT, completing 10 visits. Today, pt reports pain is significantly better. Pt notes much benefit from PT and dry needling. Pt notes 90% improvement overall. Pt has not re-started resistance training yet. Pt reports the paresthesia in his R hand/thumb has also improvement. Pt is out of Flexeril, that seem to help at night.  Dx imaging: 05/27/21 C-spine XR  Pertinent review of systems: No fevers or chills  Relevant historical information: Hyperlipidemia and prediabetes   Exam:  BP (!) 152/102    Pulse 70    Ht 5' 11"  (1.803 m)    Wt 239 lb 3.2 oz (108.5 kg)    SpO2 97%    BMI 33.36 kg/m  General: Well Developed, well nourished, and in no acute distress.   MSK: C-spine: Normal. Nontender midline. Decreased cervical motion. Upper extremity strength is intact.    Lab and Radiology Results EXAM: CERVICAL SPINE - COMPLETE 4+ VIEW   COMPARISON:  None.   FINDINGS: Advanced diffuse degenerative facet disease. Moderate degenerative disc disease in the lower cervical spine from C4-5 through C6-7. No fracture. Normal alignment. Prevertebral soft tissues are normal. Moderate multilevel bilateral neural foraminal narrowing.   IMPRESSION: Degenerative disc and facet disease. Multilevel bilateral neural foraminal narrowing. No acute bony abnormality.     Electronically Signed   By: David Newman M.D.   On: 05/27/2021 21:46   I, David Newman, personally (independently) visualized and performed the interpretation of the images attached in this note.     Assessment and Plan: 62 y.o. male with neck pain.   Significantly improved with physical therapy.  He is a little bit more physical therapy to go.  He would like to resume resistance training.  I think this is a great idea and give him some pointers but I think it be a great thing to talk about with physical therapy as well.  Recheck back with me as needed for this issue.  He continues to have a tiny little bit of numbness in his right thumb.  This is probably C6 cervical radiculopathy.  He does not think his current symptoms are bad enough that further evaluation is warranted.  I think this is reasonable.  Recheck back as needed.    Discussed warning signs or symptoms. Please see discharge instructions. Patient expresses understanding.   The above documentation has been reviewed and is accurate and complete David Newman, M.D.

## 2021-07-09 NOTE — Patient Instructions (Signed)
Thank you for coming in today.   Finish our physical therapy and let me know how you are doing.

## 2021-07-13 ENCOUNTER — Encounter (INDEPENDENT_AMBULATORY_CARE_PROVIDER_SITE_OTHER): Payer: Self-pay | Admitting: Family Medicine

## 2021-07-13 ENCOUNTER — Other Ambulatory Visit (HOSPITAL_COMMUNITY): Payer: Self-pay

## 2021-07-13 ENCOUNTER — Ambulatory Visit (INDEPENDENT_AMBULATORY_CARE_PROVIDER_SITE_OTHER): Payer: 59 | Admitting: Family Medicine

## 2021-07-13 ENCOUNTER — Other Ambulatory Visit: Payer: Self-pay

## 2021-07-13 VITALS — BP 127/81 | HR 86 | Temp 98.0°F | Ht 71.0 in | Wt 230.0 lb

## 2021-07-13 DIAGNOSIS — E669 Obesity, unspecified: Secondary | ICD-10-CM

## 2021-07-13 DIAGNOSIS — E559 Vitamin D deficiency, unspecified: Secondary | ICD-10-CM | POA: Diagnosis not present

## 2021-07-13 DIAGNOSIS — R7303 Prediabetes: Secondary | ICD-10-CM

## 2021-07-13 DIAGNOSIS — Z6832 Body mass index (BMI) 32.0-32.9, adult: Secondary | ICD-10-CM | POA: Diagnosis not present

## 2021-07-13 DIAGNOSIS — E538 Deficiency of other specified B group vitamins: Secondary | ICD-10-CM | POA: Diagnosis not present

## 2021-07-13 DIAGNOSIS — M25512 Pain in left shoulder: Secondary | ICD-10-CM | POA: Diagnosis not present

## 2021-07-13 DIAGNOSIS — Z6834 Body mass index (BMI) 34.0-34.9, adult: Secondary | ICD-10-CM

## 2021-07-13 MED ORDER — VITAMIN D (ERGOCALCIFEROL) 1.25 MG (50000 UNIT) PO CAPS
50000.0000 [IU] | ORAL_CAPSULE | ORAL | 0 refills | Status: DC
  Filled 2021-07-13: qty 12, 84d supply, fill #0

## 2021-07-13 MED ORDER — CYANOCOBALAMIN 1000 MCG/ML IJ SOLN
1000.0000 ug | INTRAMUSCULAR | 15 refills | Status: DC
  Filled 2021-07-13: qty 3, 84d supply, fill #0

## 2021-07-13 MED ORDER — CYCLOBENZAPRINE HCL 5 MG PO TABS
5.0000 mg | ORAL_TABLET | Freq: Three times a day (TID) | ORAL | 1 refills | Status: DC | PRN
  Filled 2021-07-13: qty 20, 7d supply, fill #0
  Filled 2021-08-03: qty 20, 7d supply, fill #1

## 2021-07-13 MED ORDER — "SYRINGE 25G X 1"" 3 ML MISC"
0 refills | Status: DC
  Filled 2021-07-13: qty 50, 50d supply, fill #0

## 2021-07-13 MED ORDER — TIRZEPATIDE 5 MG/0.5ML ~~LOC~~ SOAJ
5.0000 mg | SUBCUTANEOUS | 3 refills | Status: DC
  Filled 2021-07-13: qty 6, 84d supply, fill #0

## 2021-07-14 ENCOUNTER — Ambulatory Visit: Payer: 59 | Admitting: Rehabilitative and Restorative Service Providers"

## 2021-07-14 DIAGNOSIS — R293 Abnormal posture: Secondary | ICD-10-CM | POA: Diagnosis not present

## 2021-07-14 DIAGNOSIS — M542 Cervicalgia: Secondary | ICD-10-CM

## 2021-07-14 NOTE — Therapy (Signed)
[place], [place], [address] Phone: [phone]   Fax:  [phone]  Physical Therapy Treatment  Patient Details  Name: David Newman MRN: [id number] Date of Birth: July 07, 1960 Referring Provider (PT): Lynne Leader, MD   Encounter Date: 07/14/2021   PT End of Session - 07/14/21 0848     Visit Number 11    Number of Visits 12    Date for PT Re-Evaluation 07/21/21    Authorization Type UMR    PT Start Time [phone]    PT Stop Time 0930    PT Time Calculation (min) 44 min    Activity Tolerance Patient tolerated treatment well    Behavior During Therapy [facility] for tasks assessed/performed             Past Medical History:  Diagnosis Date   Anal fissure    At risk for sleep apnea    STOP-BANG= 5    SENT TO PCP 03-27-2014   Back pain    CAD (coronary artery disease)    Colon polyps    hyperplastic   DDD (degenerative disc disease), lumbar    GERD (gastroesophageal reflux disease)    Gout    High blood pressure    Hyperlipidemia    Joint pain    Knee pain    OA (osteoarthritis) of knee    Prediabetes    Right knee meniscal tear    Sleep apnea    Wears CPAP.   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[place])    Wears contact lenses    Wears glasses     Past Surgical History:  Procedure Laterality Date   COLONOSCOPY  04-24-2012   FACIAL RECONSTRUCTION SURGERY  1973  age 61   KNEE ARTHROSCOPY Right 03/29/2014   Procedure: RIGHT ARTHROSCOPY KNEE WITH DEBRIDMENT PARTIAL medial lateral MENISCECTOMY AND CHONDROPLASTY;  Surgeon: Sydnee Cabal, MD;  Location: [place];  Service: Orthopedics;  Laterality: Right;   KNEE ARTHROSCOPY WITH ANTERIOR CRUCIATE LIGAMENT (ACL) REPAIR Right 1994   KNEE ARTHROSCOPY WITH MEDIAL MENISECTOMY Left 12/28/2018   Procedure: KNEE ARTHROSCOPY WITH partial MEDIAL MENISECTOMY, debridement chondroplasty;  Surgeon: Sydnee Cabal, MD;  Location: [facility];  Service: Orthopedics;  Laterality: Left;  with knee block   WISDOM TOOTH EXTRACTION  age 61    There were no vitals filed for this visit.   Subjective Assessment - 07/14/21 0849     Subjective The patient reports 60% improvement.  He is interested in returning to resistance training. His prior resistance training included low back/core ther ex.  He has taken a break from those activities since this injury. He was also doing push ups prior to this injury and dumbbells (12-15#).    Diagnostic tests Degenerative disc and facet disease. Multilevel bilateral neural  foraminal narrowing. No acute bony abnormality.    Patient Stated Goals Decrease tightness in neck/shoulder, decrease in numbness and tingling in the R thumb.    Currently in Pain? No/denies                [facility] PT Assessment - 07/14/21 0851       Assessment   Medical Diagnosis M25.512 (ICD-10-CM) - Left shoulder pain, unspecified chronicity  M54.2 (ICD-10-CM) - Neck pain on left side    Referring Provider (PT) Lynne Leader, MD    Onset Date/Surgical Date 04/27/21  [place] [facility] Adult PT Treatment/Exercise - 07/14/21 0851       Exercises   Exercises Shoulder;Neck      Neck Exercises: Machines for Strengthening   UBE (Upper Arm Bike) UBE L 4 x 2 minutes forward/ 2 minutes back      Neck Exercises: Standing   Other Standing Exercises wall lean thoracic opening (thread the needle) x 6 reps x 2 sets R and L sides      Neck Exercises: Prone   Axial Exension 10 reps    Plank plank on knees progressing to standard plank on elbows x 5 reps x 5-10 second holds    Other Prone Exercise I's single lift x 10 reps R and L, Ts x 10 reps with axial extension    Other Prone Exercise Quadriped thread the needle x 5 reps R and L sides      Neck Exercises: Stretches   Upper Trapezius Stretch 1 rep;Left;20 seconds      Shoulder Exercises: Standing   External Rotation  Strengthening;Left;10 reps    Theraband Level (Shoulder External Rotation) Level 3 (Green)    Internal Rotation Strengthening;Left;10 reps    Theraband Level (Shoulder Internal Rotation) Level 3 (Green)    Row Strengthening;Both;10 reps    Theraband Level (Shoulder Row) Level 4 (Blue)   then progressed to black     Shoulder Exercises: Stretch   Wall Stretch - ABduction 2 reps;20 seconds    Other Shoulder Stretches doorway stretch x 3 positions                          PT Long Term Goals - 06/10/21 1048       PT LONG TERM GOAL #1   Title The patinet will be indep with HEP.    Time 6    Period Weeks    Target Date 07/21/21      PT LONG TERM GOAL #2   Title The patient will imrpove functional status score from 57% up to 71% to demo dec'd limitation.    Time 6    Period Weeks    Target Date 07/21/21      PT LONG TERM GOAL #3   Title The patient will report resolution of R thumb numbness and tingling.    Time 6    Period Weeks    Target Date 07/21/21      PT LONG TERM GOAL #4   Title The patient will tolerate laying on his left side without increased pain.    Time 6    Period Weeks    Target Date 07/21/21      PT LONG TERM GOAL #5   Title The patient will demo improving posture and verbalize strategies to improve mechanics in work environment.    Time 6    Period Weeks    Target Date 07/21/21                   Plan - 07/14/21 0953     Clinical Impression Statement Karn Pickler is continuing to progress with therapeutic exercise tolerance in PT.  He has physician follow-up since last PT session and notes continued improvement.  He has not returned to prior resistance training.  Today's session emphasized progressive resistance training, thoracic opening, and return to prior activity level.  PT and patient discussed continuation of PT beyond 3/7 as he wants to ensure success with return to recreational exercise routine.    PT Treatment/Interventions  Taping;Dry needling;ADLs/Self Care Home Management;Patient/family education;Cryotherapy;Electrical Stimulation;Moist Heat;Traction;Therapeutic exercise;Therapeutic activities;Manual techniques;Passive range of motion    PT Next Visit Plan Anticipate renewal x 1-2 more weeks based on patient feedback-- wants to return to prior exercise routine and continue with DN as needed for 2 more weeks.  Progress HEP, postural strength and stretching, DN/manual work    PT Home Exercise Plan [email or web address]- updated on 2/28 based on what patient is currently doing-- has work activities (no resistance) and home routine.    Consulted and Agree with Plan of Care Patient             Patient will benefit from skilled therapeutic intervention in order to improve the following deficits and impairments:  Pain, Hypomobility, Impaired flexibility, Decreased range of motion, Impaired tone, Increased fascial restricitons, Postural dysfunction  Visit Diagnosis: Cervicalgia  Abnormal posture     Problem List Patient Active Problem List   Diagnosis Date Noted   Aortic atherosclerosis ([place]), Cardiac CT 02/27/20 03/04/2020   Esophageal thickening, Cardiac CT 02/27/20 03/04/2020   Abnormal screening cardiac CT, 02/27/20, score 336, 89th% 03/04/2020   Visceral obesity 03/04/2020   Mixed hyperlipidemia, at goal, Rx Crestor 03/50/0938   Metabolic syndrome 18/29/9371   Vitamin D deficiency, at goal with supplementation 11/13/2019   B12 deficiency, at goal with supplementation 11/13/2019   Prediabetes 11/13/2019   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   Low back pain 04/20/2018   Class 1 obesity with serious comorbidity and body mass index (BMI) of 32.0 to 32.9 in adult 08/05/2017   OSA on CPAP 04/05/2017   Ulcerative colitis ([place]) 11/04/2015   Adjustment insomnia 11/04/2015   Allergic rhinitis 11/03/2015   S/P right knee arthroscopy 03/29/2014    Nisha Dhami, PT 07/14/2021, 9:55 AM  [facility] [place], [place], [address] Phone: [phone]   Fax:  [phone]  Name: GEDEON BRANDOW MRN: [id number] Date of Birth: Oct 06, 1960

## 2021-07-14 NOTE — Patient Instructions (Signed)
Access Code: [email or web address] URL: [email or web address] Date: 07/14/2021 Prepared by: Rudell Cobb  Exercises Seated Cervical Retraction - 1 x daily - 7 x weekly - 1-2 sets - 5-10 reps - 10 sec hold Seated Scapular Retraction - 1 x daily - 7 x weekly - 1-2 sets - 10 reps - 10 sec hold Shoulder External Rotation in 45 Degrees Abduction - 1 x daily - 7 x weekly - 1-2 sets - 10 reps - 3 sec hold Doorway Pec Stretch at 60 Degrees Abduction - 3 x daily - 7 x weekly - 1 sets - 3 reps Doorway Pec Stretch at 90 Degrees Abduction - 3 x daily - 7 x weekly - 1 sets - 3 reps - 30 seconds hold Doorway Pec Stretch at 120 Degrees Abduction - 3 x daily - 7 x weekly - 1 sets - 3 reps - 30 second hold hold Seated Thoracic Lumbar Extension with Pectoralis Stretch - 1 x daily - 7 x weekly - 1 sets - 5-8 reps - 10 sec hold Standing Shoulder Row with Anchored Resistance - 1 x daily - 7 x weekly - 1 sets - 10 reps Shoulder External Rotation with Anchored Resistance - 1 x daily - 7 x weekly - 1 sets - 10 reps Standing Shoulder Internal Rotation with Anchored Resistance - 1 x daily - 7 x weekly - 1 sets - 10 reps Prone W Scapular Retraction - 1 x daily - 7 x weekly - 1 sets - 5-10 reps - 3-5 sec hold Standard Plank - 1 x daily - 7 x weekly - 1 sets - 10 reps - 5-10 seconds hold Quadruped Full Range Thoracic Rotation with Reach - 1 x daily - 7 x weekly - 1 sets - 5 reps

## 2021-07-14 NOTE — Progress Notes (Signed)
Chief Complaint:   OBESITY David Newman is here to discuss his progress with his obesity treatment plan along with follow-up of his obesity related diagnoses. See Medical Weight Management Flowsheet for complete bioelectrical impedance results.  Today's visit was #: 63 Starting weight: 249 lbs Starting date: 10/16/2019 Weight change since last visit: 2 lbs Total lbs lost to date: 19 lbs Total weight loss percentage to date: -7.63%  Nutrition Plan: Keeping a food journal and adhering to recommended goals of 1800-2200 calories and 135 grams of protein daily for 50% of the time. Activity: Increased walking. Anti-obesity medications: Mounjaro 5 mg subcutaneously weekly. Reported side effects: None.  Interim History: David Newman says he changing from [employer] to [employer].  His left shoulder pain is improving due to PT and dry needling.  He stopped taking gabapentin.    Assessment/Plan:   1. B12 deficiency Supplementation: Vitamin B12 injection once monthly.  Plan:  Continue vitamin B12 injection.  Will refill 12 and supplies today.     Lab Results  Component Value Date   HBZJIRCV89 381 01/29/2021   - Refill Syringe/Needle, Disp, (SYRINGE 3CC/25GX1") 25G X 1" 3 ML MISC; For B12 injections.  Dispense: 50 each; Refill: 0 - Refill cyanocobalamin (,VITAMIN B-12,) 1000 MCG/ML injection; Inject 1 mL (1,000 mcg total) into the skin once a week for 4 weeks then 1 mL every 30 (thirty) days.  Dispense: 1 mL; Refill: 15  2. Prediabetes, with polyphagia At goal. Goal is HgbA1c < 5.7.  Medication: Mounjaro 5 mg subcutaneously weekly.    Plan: Continue Mounjaro 5 mg subcutaneously weekly.  Will refill today.  He will continue to focus on protein-rich, low simple carbohydrate foods. We reviewed the importance of hydration, regular exercise for stress reduction, and restorative sleep.   Lab Results  Component Value Date   HGBA1C 5.5 01/29/2021   Lab Results  Component Value Date   INSULIN  18.1 01/29/2021   INSULIN 11.0 06/09/2020   - Refill tirzepatide (MOUNJARO) 5 MG/0.5ML Pen; Inject 5 mg into the skin once a week.  Dispense: 6 mL; Refill: 3  3. Vitamin D deficiency At goal. He is taking vitamin D 50,000 IU weekly.  Plan: Continue to take prescription Vitamin D @50 ,000 IU every week as prescribed.  Follow-up for routine testing of Vitamin D, at least 2-3 times per year to avoid over-replacement.  Lab Results  Component Value Date   VD25OH 76.3 01/29/2021   VD25OH 52.1 06/09/2020   VD25OH 45.7 01/23/2020   - Refill Vitamin D, Ergocalciferol, (DRISDOL) 1.25 MG (50000 UNIT) CAPS capsule; Take 1 capsule (50,000 Units total) by mouth every 7 (seven) days.  Dispense: 12 capsule; Refill: 0  4. Acute pain of left shoulder Improving with PT and dry needling.  Plan:  Refill Flexeril 5 mg three times daily as needed for muscle spasms.  - Refill cyclobenzaprine (FLEXERIL) 5 MG tablet; Take 1 tablet (5 mg total) by mouth 3 (three) times daily as needed for muscle spasms.  Dispense: 20 tablet; Refill: 1  5. Obesity, current BMI 32.2  Course: David Newman is currently in the action stage of change. As such, his goal is to continue with weight loss efforts.   Nutrition goals: He has agreed to keeping a food journal and adhering to recommended goals of 1800-2200 calories and 135 grams of protein.   Exercise goals:  As is.  Behavioral modification strategies: increasing lean protein intake, decreasing simple carbohydrates, increasing vegetables, and increasing water intake.  David Newman has  agreed to follow-up with our clinic in 4 weeks. He was informed of the importance of frequent follow-up visits to maximize his success with intensive lifestyle modifications for his multiple health conditions.   Objective:   Blood pressure 127/81, pulse 86, temperature 98 F (36.7 C), temperature source Oral, height 5' 11"  (1.803 m), weight 230 lb (104.3 kg), SpO2 97 %. Body mass index is 32.08  kg/m.  General: Cooperative, alert, well developed, in no acute distress. HEENT: Conjunctivae and lids unremarkable. Cardiovascular: Regular rhythm.  Lungs: Normal work of breathing. Neurologic: No focal deficits.   Lab Results  Component Value Date   CREATININE 1.06 01/29/2021   BUN 19 01/29/2021   NA 139 01/29/2021   K 4.4 01/29/2021   CL 103 01/29/2021   CO2 22 01/29/2021   Lab Results  Component Value Date   ALT 29 01/29/2021   AST 25 01/29/2021   ALKPHOS 85 01/29/2021   BILITOT 0.5 01/29/2021   Lab Results  Component Value Date   HGBA1C 5.5 01/29/2021   HGBA1C 5.5 01/23/2020   Lab Results  Component Value Date   INSULIN 18.1 01/29/2021   INSULIN 11.0 06/09/2020   Lab Results  Component Value Date   TSH 1.740 10/16/2019   Lab Results  Component Value Date   CHOL 173 01/29/2021   HDL 56 01/29/2021   LDLCALC 92 01/29/2021   TRIG 141 01/29/2021   CHOLHDL 3.1 01/29/2021   Lab Results  Component Value Date   VD25OH 76.3 01/29/2021   VD25OH 52.1 06/09/2020   VD25OH 45.7 01/23/2020   Lab Results  Component Value Date   WBC 5.7 01/29/2021   HGB 15.1 01/29/2021   HCT 43.5 01/29/2021   MCV 88 01/29/2021   PLT 232 01/29/2021   Attestation Statements:   Reviewed by clinician on day of visit: allergies, medications, problem list, medical history, surgical history, family history, social history, and previous encounter notes.  I, Water quality scientist, CMA, am acting as transcriptionist for Briscoe Deutscher, DO  I have reviewed the above documentation for accuracy and completeness, and I agree with the above. -  Briscoe Deutscher, DO, MS, FAAFP, DABOM - Family and Bariatric Medicine.

## 2021-07-15 ENCOUNTER — Other Ambulatory Visit (HOSPITAL_COMMUNITY): Payer: Self-pay

## 2021-07-16 ENCOUNTER — Other Ambulatory Visit: Payer: Self-pay

## 2021-07-16 ENCOUNTER — Ambulatory Visit: Payer: 59 | Attending: Family Medicine | Admitting: Rehabilitative and Restorative Service Providers"

## 2021-07-16 ENCOUNTER — Encounter: Payer: Self-pay | Admitting: Rehabilitative and Restorative Service Providers"

## 2021-07-16 DIAGNOSIS — M542 Cervicalgia: Secondary | ICD-10-CM | POA: Diagnosis not present

## 2021-07-16 DIAGNOSIS — R293 Abnormal posture: Secondary | ICD-10-CM | POA: Insufficient documentation

## 2021-07-16 NOTE — Therapy (Signed)
[place], [place], [address] Phone: [phone]   Fax:  [phone]  Physical Therapy Treatment  Patient Details  Name: David Newman MRN: [id number] Date of Birth: 08/23/1960 Referring Provider (PT): Lynne Leader, MD   Encounter Date: 07/16/2021   PT End of Session - 07/16/21 0804     Visit Number 12    Number of Visits 224    Date for PT Re-Evaluation 08/27/21    Authorization Type UMR    PT Start Time 0800    PT Stop Time 0854    PT Time Calculation (min) 54 min    Activity Tolerance Patient tolerated treatment well             Past Medical History:  Diagnosis Date   Anal fissure    At risk for sleep apnea    STOP-BANG= 5    SENT TO PCP 03-27-2014   Back pain    CAD (coronary artery disease)    Colon polyps    hyperplastic   DDD (degenerative disc disease), lumbar    GERD (gastroesophageal reflux disease)    Gout    High blood pressure    Hyperlipidemia    Joint pain    Knee pain    OA (osteoarthritis) of knee    Prediabetes    Right knee meniscal tear    Sleep apnea    Wears CPAP.   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[place])    Wears contact lenses    Wears glasses     Past Surgical History:  Procedure Laterality Date   COLONOSCOPY  04-24-2012   FACIAL RECONSTRUCTION SURGERY  1973  age 61   KNEE ARTHROSCOPY Right 03/29/2014   Procedure: RIGHT ARTHROSCOPY KNEE WITH DEBRIDMENT PARTIAL medial lateral MENISCECTOMY AND CHONDROPLASTY;  Surgeon: Sydnee Cabal, MD;  Location: [place];  Service: Orthopedics;  Laterality: Right;   KNEE ARTHROSCOPY WITH ANTERIOR CRUCIATE LIGAMENT (ACL) REPAIR Right 1994   KNEE ARTHROSCOPY WITH MEDIAL MENISECTOMY Left 12/28/2018   Procedure: KNEE ARTHROSCOPY WITH partial MEDIAL MENISECTOMY, debridement chondroplasty;  Surgeon: Sydnee Cabal, MD;  Location: [facility];  Service: Orthopedics;  Laterality: Left;  with  knee block   WISDOM TOOTH EXTRACTION  age 61    There were no vitals filed for this visit.   Subjective Assessment - 07/16/21 0811     Subjective Not sleeping well last night. Tired. Overall patient can see progress with symptoms. Wants to progress with strengthening.    Currently in Pain? No/denies    Pain Score 0-No pain    Pain Location Shoulder    Pain Orientation Left    Pain Descriptors / Indicators Tightness    Pain Type Acute pain;Chronic pain    Pain Onset More than a month ago    Pain Frequency Intermittent                [facility] PT Assessment - 07/16/21 0001       Assessment   Medical Diagnosis M25.512 (ICD-10-CM) - Left shoulder pain, unspecified chronicity  M54.2 (ICD-10-CM) - Neck pain on left side    Referring Provider (PT) Lynne Leader, MD    Onset Date/Surgical Date 04/27/21      AROM   Cervical Flexion 53    Cervical Extension 53    Cervical - Right Side Bend 27    Cervical - Left Side Bend 38    Cervical - Right Rotation 63    Cervical -  Left Rotation 60      Strength   Right Shoulder Flexion 5/5    Right Shoulder ABduction 5/5    Left Shoulder Flexion 5/5    Left Shoulder ABduction 5/5    Right Elbow Flexion 5/5    Right Elbow Extension 5/5    Left Elbow Flexion 5/5    Left Elbow Extension 5/5    Right Hand Grip (lbs) 125    Left Hand Grip (lbs) 115      Palpation   Spinal mobility hypomobility c-spine    Palpation comment myofascial trigger points along bilat upper trap, levator, R scalenes, cervical paraspinals bilaterally; Patient has some tightness with AC ant-post mobilizations and sternoclavicular mobs, also notes tenderness at costosternal joints                           [facility] Adult PT Treatment/Exercise - 07/16/21 0001       Neck Exercises: Machines for Strengthening   UBE (Upper Arm Bike) UBE L 5 x 2 minutes forward/ 2 minutes back      Neck Exercises: Seated   Other Seated Exercise thoracic extension seated  with coregeous ball at T-spine      Neck Exercises: Prone   Plank plank on knees progressing to standard plank on elbows x 5 reps x 5-10 second holds    Other Prone Exercise I's single lift x 10 reps R and L, Ts x 10 reps with axial extension    Other Prone Exercise Quadriped thread the needle x 5 reps R and L sides      Shoulder Exercises: Standing   External Rotation Strengthening;Left;10 reps    Theraband Level (Shoulder External Rotation) Level 4 (Blue)    Internal Rotation Strengthening;Left;10 reps    Theraband Level (Shoulder Internal Rotation) Level 4 (Blue)    Row Strengthening;Both;10 reps    Theraband Level (Shoulder Row) Level 4 (Blue)   then progressed to black     Shoulder Exercises: Stretch   Other Shoulder Stretches doorway stretch x 3 positions      Moist Heat Therapy   Number Minutes Moist Heat 10 Minutes    Moist Heat Location Cervical;Shoulder      Electrical Stimulation   Electrical Stimulation Location Lt upper trap; Lt pecs/anterior shoulder    Electrical Stimulation Action TENS    Electrical Stimulation Parameters to tolerance    Electrical Stimulation Goals Tone;Pain      Manual Therapy   Soft tissue mobilization STM through the thoracic and upper trap musculature as well as through the pecs and upper traps pt supine    Myofascial Release anterior chest/shoulder                          PT Long Term Goals - 07/16/21 0804       PT LONG TERM GOAL #1   Title The patinet will be indep with HEP and progress to return to independent and consistent exercise routine    Time 6    Period Weeks    Status New    Target Date 08/27/21      PT LONG TERM GOAL #2   Title The patient will imrpove functional status score from 57% up to 71% to demo dec'd limitation.    Baseline 85% improved    Time 6    Period Weeks    Status Achieved    Target Date 07/21/21  PT LONG TERM GOAL #3   Title The patient will report resolution of R thumb  numbness and tingling.    Time 6    Period Weeks    Status Achieved    Target Date 07/21/21      PT LONG TERM GOAL #4   Title The patient will tolerate laying on his left side without increased pain.    Baseline -    Period Weeks    Status Achieved    Target Date 07/21/21      PT LONG TERM GOAL #5   Title The patient will demo improving posture and verbalize strategies to improve mechanics in work environment.    Baseline -    Time 6    Period Weeks    Status On-going    Target Date 08/27/21                   Plan - 07/16/21 0813     Clinical Impression Statement Patient is progressing well with rehab. He has less pain and improved functional activities. He is working with exercises and increasing functional activities.    Rehab Potential Good    PT Frequency 2x / week    PT Duration 6 weeks    PT Treatment/Interventions Taping;Dry needling;ADLs/Self Care Home Management;Patient/family education;Cryotherapy;Electrical Stimulation;Moist Heat;Traction;Therapeutic exercise;Therapeutic activities;Manual techniques;Passive range of motion    PT Next Visit Plan Anticipate continued treatment x 1-2 more weeks based on patient feedback-- wants to return to prior exercise routine and continue with DN as needed for 2 more weeks.  Progress HEP, postural strength and stretching, DN/manual work    PT Home Exercise Plan [email or web address]- updated on 2/28 based on what patient is currently doing-- has work activities (no resistance) and home routine.    Consulted and Agree with Plan of Care Patient             Patient will benefit from skilled therapeutic intervention in order to improve the following deficits and impairments:     Visit Diagnosis: Cervicalgia  Abnormal posture     Problem List Patient Active Problem List   Diagnosis Date Noted   Aortic atherosclerosis ([place]), Cardiac CT 02/27/20 03/04/2020   Esophageal thickening, Cardiac CT 02/27/20 03/04/2020   Abnormal  screening cardiac CT, 02/27/20, score 336, 89th% 03/04/2020   Visceral obesity 03/04/2020   Mixed hyperlipidemia, at goal, Rx Crestor 53/20/2334   Metabolic syndrome 35/68/6168   Vitamin D deficiency, at goal with supplementation 11/13/2019   B12 deficiency, at goal with supplementation 11/13/2019   Prediabetes 11/13/2019   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   Low back pain 04/20/2018   Class 1 obesity with serious comorbidity and body mass index (BMI) of 32.0 to 32.9 in adult 08/05/2017   OSA on CPAP 04/05/2017   Ulcerative colitis ([place]) 11/04/2015   Adjustment insomnia 11/04/2015   Allergic rhinitis 11/03/2015   S/P right knee arthroscopy 03/29/2014    Kyndahl Jablon Nilda Simmer, PT, MPH 07/16/2021, 8:44 AM  [facility] [place], [place], [address] Phone: [phone]   Fax:  [phone]  Name: KYLAN LIBERATI MRN: [id number] Date of Birth: 08-03-1960

## 2021-07-21 ENCOUNTER — Ambulatory Visit: Payer: 59 | Admitting: Rehabilitative and Restorative Service Providers"

## 2021-07-21 ENCOUNTER — Encounter: Payer: Self-pay | Admitting: Rehabilitative and Restorative Service Providers"

## 2021-07-21 ENCOUNTER — Other Ambulatory Visit: Payer: Self-pay

## 2021-07-21 DIAGNOSIS — M542 Cervicalgia: Secondary | ICD-10-CM | POA: Diagnosis not present

## 2021-07-21 DIAGNOSIS — R293 Abnormal posture: Secondary | ICD-10-CM | POA: Diagnosis not present

## 2021-07-21 NOTE — Patient Instructions (Signed)
Access Code: [email or web address] ?URL: [email or web address] ?Date: 07/21/2021 ?Prepared by: Gillermo Murdoch ? ?Exercises ?Seated Cervical Retraction - 1 x daily - 7 x weekly - 1-2 sets - 5-10 reps - 10 sec hold ?Seated Scapular Retraction - 1 x daily - 7 x weekly - 1-2 sets - 10 reps - 10 sec hold ?Shoulder External Rotation in 45 Degrees Abduction - 1 x daily - 7 x weekly - 1-2 sets - 10 reps - 3 sec hold ?Doorway Pec Stretch at 60 Degrees Abduction - 3 x daily - 7 x weekly - 1 sets - 3 reps ?Doorway Pec Stretch at 90 Degrees Abduction - 3 x daily - 7 x weekly - 1 sets - 3 reps - 30 seconds hold ?Doorway Pec Stretch at 120 Degrees Abduction - 3 x daily - 7 x weekly - 1 sets - 3 reps - 30 second hold hold ?Seated Thoracic Lumbar Extension with Pectoralis Stretch - 1 x daily - 7 x weekly - 1 sets - 5-8 reps - 10 sec hold ?Standing Shoulder Row with Anchored Resistance - 1 x daily - 7 x weekly - 1 sets - 10 reps ?Shoulder External Rotation with Anchored Resistance - 1 x daily - 7 x weekly - 1 sets - 10 reps ?Standing Shoulder Internal Rotation with Anchored Resistance - 1 x daily - 7 x weekly - 1 sets - 10 reps ?Prone W Scapular Retraction - 1 x daily - 7 x weekly - 1 sets - 5-10 reps - 3-5 sec hold ?Standard Plank - 1 x daily - 7 x weekly - 1 sets - 10 reps - 5-10 seconds hold ?Quadruped Full Range Thoracic Rotation with Reach - 1 x daily - 7 x weekly - 1 sets - 5 reps ?Standing Shoulder External Rotation Stretch at Wall - 2 x daily - 7 x weekly - 1 sets - 3 reps - 30 sec hold ? ?

## 2021-07-21 NOTE — Therapy (Signed)
[facility] [place], [place], [address] Phone: [phone]   Fax:  [phone]  Physical Therapy Treatment  Patient Details  Name: David Newman MRN: [id number] Date of Birth: Mar 14, 1961 Referring Provider (PT): Lynne Leader, MD   Encounter Date: 07/21/2021   PT End of Session - 07/21/21 0804     Visit Number 13    Number of Visits 24    Date for PT Re-Evaluation 08/27/21    Authorization Type UMR    PT Start Time 0800    PT Stop Time 0848    PT Time Calculation (min) 48 min    Activity Tolerance Patient tolerated treatment well             Past Medical History:  Diagnosis Date   Anal fissure    At risk for sleep apnea    STOP-BANG= 5    SENT TO PCP 03-27-2014   Back pain    CAD (coronary artery disease)    Colon polyps    hyperplastic   DDD (degenerative disc disease), lumbar    GERD (gastroesophageal reflux disease)    Gout    High blood pressure    Hyperlipidemia    Joint pain    Knee pain    OA (osteoarthritis) of knee    Prediabetes    Right knee meniscal tear    Sleep apnea    Wears CPAP.   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[place])    Wears contact lenses    Wears glasses     Past Surgical History:  Procedure Laterality Date   COLONOSCOPY  04-24-2012   FACIAL RECONSTRUCTION SURGERY  1973  age 61   KNEE ARTHROSCOPY Right 03/29/2014   Procedure: RIGHT ARTHROSCOPY KNEE WITH DEBRIDMENT PARTIAL medial lateral MENISCECTOMY AND CHONDROPLASTY;  Surgeon: Sydnee Cabal, MD;  Location: [facility];  Service: Orthopedics;  Laterality: Right;   KNEE ARTHROSCOPY WITH ANTERIOR CRUCIATE LIGAMENT (ACL) REPAIR Right 1994   KNEE ARTHROSCOPY WITH MEDIAL MENISECTOMY Left 12/28/2018   Procedure: KNEE ARTHROSCOPY WITH partial MEDIAL MENISECTOMY, debridement chondroplasty;  Surgeon: Sydnee Cabal, MD;  Location: [facility];  Service: Orthopedics;  Laterality: Left;  with  knee block   WISDOM TOOTH EXTRACTION  age 61    There were no vitals filed for this visit.   Subjective Assessment - 07/21/21 0805     Subjective Patient reports that he is feeling "pretty good". Has some soreness in the clavicle and rib below it.    Currently in Pain? No/denies    Pain Score 0-No pain    Pain Location Shoulder    Pain Orientation Left    Pain Descriptors / Indicators Sore    Pain Type Acute pain;Chronic pain    Pain Radiating Towards less numbness in the Rt thumb    Pain Onset More than a month ago    Pain Frequency Intermittent                [facility] PT Assessment - 07/21/21 0001       Assessment   Medical Diagnosis M25.512 (ICD-10-CM) - Left shoulder pain, unspecified chronicity  M54.2 (ICD-10-CM) - Neck pain on left side    Referring Provider (PT) Lynne Leader, MD    Onset Date/Surgical Date 04/27/21      Palpation   Spinal mobility hypomobility c-spine    Palpation comment tightness anterior lateral cervical musculature; clavicular area into pecs/anterior chest  [place] Adult PT Treatment/Exercise - 07/21/21 0001       Neck Exercises: Machines for Strengthening   UBE (Upper Arm Bike) UBE L 5 x 2.5 minutes forward/ 2.5 minutes back      Neck Exercises: Seated   Other Seated Exercise thoracic extension seated with coregeous ball at T-spine      Neck Exercises: Prone   Plank plank on knees progressing to standard plank on elbows x 5 reps x 5-10 second holds    Other Prone Exercise I's single lift x 10 reps R and L, Ts x 10 reps with axial extension    Other Prone Exercise Quadriped thread the needle x 5 reps R and L sides      Neck Exercises: Stretches   Other Neck Stretches T at wall arm straight and elbow at 90 deg elbow flexion 30 sec x 3 reps each      Shoulder Exercises: Stretch   Wall Stretch - Flexion Limitations shoulder flexion hands on top of doorway 30 sec x 2 reps    Wall Stretch - ABduction 2  reps;20 seconds    Other Shoulder Stretches doorway stretch x 3 positions      Moist Heat Therapy   Number Minutes Moist Heat 10 Minutes    Moist Heat Location Cervical;Shoulder      Electrical Stimulation   Electrical Stimulation Location Lt pecs/anterior shoulder    Electrical Stimulation Action TENs    Electrical Stimulation Parameters to tolerance    Electrical Stimulation Goals Tone;Pain      Manual Therapy   Soft tissue mobilization STM through the thoracic and upper trap musculature as well as through the pecs and upper traps pt supine    Myofascial Release anterior chest/shoulder    Passive ROM Lt shoulder extension and horizontal abdcution, ER  pt supine UE off edge of table                     PT Education - 07/21/21 7628     Education Details HEP    Person(s) Educated Patient    Methods Explanation;Demonstration;Tactile cues;Verbal cues;Handout    Comprehension Verbalized understanding;Returned demonstration;Verbal cues required;Tactile cues required                 PT Long Term Goals - 07/16/21 0804       PT LONG TERM GOAL #1   Title The patinet will be indep with HEP and progress to return to independent and consistent exercise routine    Time 6    Period Weeks    Status New    Target Date 08/27/21      PT LONG TERM GOAL #2   Title The patient will imrpove functional status score from 57% up to 71% to demo dec'd limitation.    Baseline 85% improved    Time 6    Period Weeks    Status Achieved    Target Date 07/21/21      PT LONG TERM GOAL #3   Title The patient will report resolution of R thumb numbness and tingling.    Time 6    Period Weeks    Status Achieved    Target Date 07/21/21      PT LONG TERM GOAL #4   Title The patient will tolerate laying on his left side without increased pain.    Baseline -    Period Weeks    Status Achieved    Target Date 07/21/21  PT LONG TERM GOAL #5   Title The patient will demo  improving posture and verbalize strategies to improve mechanics in work environment.    Baseline -    Time 6    Period Weeks    Status On-going    Target Date 08/27/21                   Plan - 07/21/21 0808     Clinical Impression Statement Patient continues to progress with no pain only soreness in the Lt clavicle area. Numbness in the Rt thumb is improved. Continued palpable tightness through the Rt clavicular and 1st rib area into anterior chest/pecs. Added horizontal abduction stretch arm straight and elbow at 90 deg flexion.    Rehab Potential Good    PT Frequency 2x / week    PT Duration 6 weeks    PT Treatment/Interventions Taping;Dry needling;ADLs/Self Care Home Management;Patient/family education;Cryotherapy;Electrical Stimulation;Moist Heat;Traction;Therapeutic exercise;Therapeutic activities;Manual techniques;Passive range of motion    PT Next Visit Plan Anticipate continued treatment x 1-2 more weeks based on patient feedback-- wants to return to prior exercise routine and continue with DN as needed for 2 more weeks.  Progress HEP, postural strength and stretching, DN/manual work    PT Home Exercise Plan [email or web address]- updated on 2/28 based on what patient is currently doing-- has work activities (no resistance) and home routine.    Consulted and Agree with Plan of Care Patient             Patient will benefit from skilled therapeutic intervention in order to improve the following deficits and impairments:     Visit Diagnosis: Cervicalgia  Abnormal posture     Problem List Patient Active Problem List   Diagnosis Date Noted   Aortic atherosclerosis ([place]), Cardiac CT 02/27/20 03/04/2020   Esophageal thickening, Cardiac CT 02/27/20 03/04/2020   Abnormal screening cardiac CT, 02/27/20, score 336, 89th% 03/04/2020   Visceral obesity 03/04/2020   Mixed hyperlipidemia, at goal, Rx Crestor 65/68/1275   Metabolic syndrome 17/00/1749   Vitamin D deficiency, at goal  with supplementation 11/13/2019   B12 deficiency, at goal with supplementation 11/13/2019   Prediabetes 11/13/2019   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   Low back pain 04/20/2018   Class 1 obesity with serious comorbidity and body mass index (BMI) of 32.0 to 32.9 in adult 08/05/2017   OSA on CPAP 04/05/2017   Ulcerative colitis ([place]) 11/04/2015   Adjustment insomnia 11/04/2015   Allergic rhinitis 11/03/2015   S/P right knee arthroscopy 03/29/2014    Rozena Fierro Nilda Simmer, PT, MPH  07/21/2021, 8:48 AM  [facility] [place], [place], [address] Phone: [phone]   Fax:  [phone]  Name: TYRONE PAUTSCH MRN: [id number] Date of Birth: 12-30-1960

## 2021-07-23 ENCOUNTER — Encounter: Payer: Self-pay | Admitting: Rehabilitative and Restorative Service Providers"

## 2021-07-23 ENCOUNTER — Other Ambulatory Visit: Payer: Self-pay

## 2021-07-23 ENCOUNTER — Ambulatory Visit: Payer: 59 | Admitting: Rehabilitative and Restorative Service Providers"

## 2021-07-23 DIAGNOSIS — R293 Abnormal posture: Secondary | ICD-10-CM

## 2021-07-23 DIAGNOSIS — M542 Cervicalgia: Secondary | ICD-10-CM

## 2021-07-23 NOTE — Therapy (Signed)
[facility] ?[place], [place], [address] ?Phone: [phone]   Fax:  [phone] ? ?Physical Therapy Treatment ? ?Patient Details  ?Name: David Newman ?MRN: [id number] ?Date of Birth: 08/01/1960 ?Referring Provider (PT): Lynne Leader, MD ? ? ?Encounter Date: 07/23/2021 ? ? PT End of Session - 07/23/21 0803   ? ? Visit Number 14   ? Number of Visits 24   ? Date for PT Re-Evaluation 08/27/21   ? Authorization Type UMR   ? PT Start Time 0800   ? PT Stop Time 0850   ? PT Time Calculation (min) 50 min   ? Activity Tolerance Patient tolerated treatment well   ? ?  ?  ? ?  ? ? ?Past Medical History:  ?Diagnosis Date  ? Anal fissure   ? At risk for sleep apnea   ? STOP-BANG= 5    SENT TO PCP 03-27-2014  ? Back pain   ? CAD (coronary artery disease)   ? Colon polyps   ? hyperplastic  ? DDD (degenerative disc disease), lumbar   ? GERD (gastroesophageal reflux disease)   ? Gout   ? High blood pressure   ? Hyperlipidemia   ? Joint pain   ? Knee pain   ? OA (osteoarthritis) of knee   ? Prediabetes   ? Right knee meniscal tear   ? Sleep apnea   ? Wears CPAP.  ? 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 Wears contact lenses   ? Wears glasses   ? ? ?Past Surgical History:  ?Procedure Laterality Date  ? COLONOSCOPY  04-24-2012  ? FACIAL RECONSTRUCTION SURGERY  1973  age 43  ? KNEE ARTHROSCOPY Right 03/29/2014  ? Procedure: RIGHT ARTHROSCOPY KNEE WITH DEBRIDMENT PARTIAL medial lateral MENISCECTOMY AND CHONDROPLASTY;  Surgeon: Sydnee Cabal, MD;  Location: [facility];  Service: Orthopedics;  Laterality: Right;  ? KNEE ARTHROSCOPY WITH ANTERIOR CRUCIATE LIGAMENT (ACL) REPAIR Right 1994  ? KNEE ARTHROSCOPY WITH MEDIAL MENISECTOMY Left 12/28/2018  ? Procedure: KNEE ARTHROSCOPY WITH partial MEDIAL MENISECTOMY, debridement chondroplasty;  Surgeon: Sydnee Cabal, MD;  Location: [facility];  Service: Orthopedics;  Laterality: Left;  with  knee block  ? WISDOM TOOTH EXTRACTION  age 61  ? ? ?There were no vitals filed for this visit. ? ? ? ? ? ? ? ? ? ? ? ? ? ? ? ? ? ? ? ? ? [facility] Adult PT Treatment/Exercise - 07/23/21 0001   ? ?  ? Self-Care  ? Self-Care Other Self-Care Comments   ? Other Self-Care Comments  instruction in holding cell phone to minimize head forward posture   ?  ? Neck Exercises: Machines for Strengthening  ? UBE (Upper Arm Bike) UBE L 5 x 2 minutes forward/ 2 minutes back   ?  ? Neck Exercises: Stretches  ? Other Neck Stretches T at wall arm straight and elbow at 90 deg elbow flexion 30 sec x 3 reps each   ?  ? Shoulder Exercises: Stretch  ? Wall Stretch - Flexion Limitations shoulder flexion hands on top of doorway 30 sec x 2 reps   ? Wall Stretch - ABduction 2 reps;20 seconds   ? Wall Stretch - ABduction Limitations horizontal abduction   ? Other Shoulder Stretches shoulder ER with shd at 90 deg; elbow flexed at 90 deg slight turn to Rt 20-30 sec x 3 reps   ? Other Shoulder Stretches doorway stretch x 3 positions   ?  ?  Moist Heat Therapy  ? Number Minutes Moist Heat 10 Minutes   ? Moist Heat Location Cervical;Shoulder   ?  ? Electrical Stimulation  ? Electrical Stimulation Location Lt pecs/anterior shoulder   ? Electrical Stimulation Action TENs   ? Electrical Stimulation Parameters to tolerance   ? Electrical Stimulation Goals Tone;Pain   ?  ? Manual Therapy  ? Manual therapy comments skilled palpation to assess response to Dn and manual work   ? Soft tissue mobilization STM through pecs; anterior chest; deltoid; biceps; and upper trap musculature   ? Myofascial Release anterior chest   ? Passive ROM Lt shoulder extension and horizontal abduction, ER 90/90; biceps stretch pt supine UE off edge of table   ? ?  ?  ? ?  ? ? ? Trigger Point Dry Needling - 07/23/21 0001   ? ? Consent Given? Yes   ? Education Handout Provided Previously provided   ? Upper Trapezius Response Palpable increased muscle length;Twitch reponse elicited   ?  Pectoralis Major Response Palpable increased muscle length;Twitch response elicited   ? Pectoralis Minor Response Palpable increased muscle length;Twitch response elicited   ? Deltoid Response Palpable increased muscle length;Twitch response elicited   ? Biceps Response Palpable increased muscle length;Twitch response elicited   ? ?  ?  ? ?  ? ? ? ? ? ? ? ? ? ? ? ? ? PT Long Term Goals - 07/16/21 0804   ? ?  ? PT LONG TERM GOAL #1  ? Title The patinet will be indep with HEP and progress to return to independent and consistent exercise routine   ? Time 6   ? Period Weeks   ? Status New   ? Target Date 08/27/21   ?  ? PT LONG TERM GOAL #2  ? Title The patient will imrpove functional status score from 57% up to 71% to demo dec'd limitation.   ? Baseline 85% improved   ? Time 6   ? Period Weeks   ? Status Achieved   ? Target Date 07/21/21   ?  ? PT LONG TERM GOAL #3  ? Title The patient will report resolution of R thumb numbness and tingling.   ? Time 6   ? Period Weeks   ? Status Achieved   ? Target Date 07/21/21   ?  ? PT LONG TERM GOAL #4  ? Title The patient will tolerate laying on his left side without increased pain.   ? Baseline -   ? Period Weeks   ? Status Achieved   ? Target Date 07/21/21   ?  ? PT LONG TERM GOAL #5  ? Title The patient will demo improving posture and verbalize strategies to improve mechanics in work environment.   ? Baseline -   ? Time 6   ? Period Weeks   ? Status On-going   ? Target Date 08/27/21   ? ?  ?  ? ?  ? ? ? ? ? ? ? ? Plan - 07/23/21 0907   ? ? Clinical Impression Statement Decreased tightness and discomfort. Patient has continued muscular tightness through anterior chest in pecs, deltoid, biceps and upper trap. Good response to Dn and manual work including passive stretch for Lt shoulder into extension, horizontal abduction, ER in some horizontal abductioin, biceps stretch UE off edge of bed. Progressing well toward stated goals of therapy.   ? Rehab Potential Good   ? PT  Frequency 2x / week   ?  PT Duration 6 weeks   ? PT Treatment/Interventions Taping;Dry needling;ADLs/Self Care Home Management;Patient/family education;Cryotherapy;Electrical Stimulation;Moist Heat;Traction;Therapeutic exercise;Therapeutic activities;Manual techniques;Passive range of motion   ? PT Next Visit Plan Anticipate continued treatment x 1-2 more weeks based on patient feedback-- wants to return to prior exercise routine and continue with DN as needed for 2 more weeks.  Progress HEP, postural strength and stretching, DN/manual work   ? PT Home Exercise Plan [id number]- updated on 2/28 based on what patient is currently doing-- has work activities (no resistance) and home routine.   ? Consulted and Agree with Plan of Care Patient   ? ?  ?  ? ?  ? ? ?Patient will benefit from skilled therapeutic intervention in order to improve the following deficits and impairments:    ? ?Visit Diagnosis: ?Cervicalgia ? ?Abnormal posture ? ? ? ? ?Problem List ?Patient Active Problem List  ? Diagnosis Date Noted  ? Aortic atherosclerosis [facility]), Cardiac CT 02/27/20 03/04/2020  ? Esophageal thickening, Cardiac CT 02/27/20 03/04/2020  ? Abnormal screening cardiac CT, 02/27/20, score 336, 89th% 03/04/2020  ? Visceral obesity 03/04/2020  ? Mixed hyperlipidemia, at goal, Rx Crestor 03/04/2020  ? Metabolic syndrome 65/07/5463  ? Vitamin D deficiency, at goal with supplementation 11/13/2019  ? B12 deficiency, at goal with supplementation 11/13/2019  ? Prediabetes 11/13/2019  ? 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  ? Low back pain 04/20/2018  ? Class 1 obesity with serious comorbidity and body mass index (BMI) of 32.0 to 32.9 in adult 08/05/2017  ? OSA on CPAP 04/05/2017  ? Ulcerative colitis ([place]) 11/04/2015  ? Adjustment insomnia 11/04/2015  ? Allergic rhinitis 11/03/2015  ? S/P right knee arthroscopy 03/29/2014  ? ? ?Everardo All, PT, MPH ?07/23/2021, 9:10 AM ? ?[facility] ?[place], [place], [address] ?Phone: [phone]   Fax:  [phone] ? ?Name: OSCAR HANK ?MRN: [id number] ?Date of Birth: 1960/06/17 ? ? ? ?

## 2021-07-28 ENCOUNTER — Other Ambulatory Visit: Payer: Self-pay

## 2021-07-28 ENCOUNTER — Encounter: Payer: Self-pay | Admitting: Rehabilitative and Restorative Service Providers"

## 2021-07-28 ENCOUNTER — Ambulatory Visit: Payer: 59 | Admitting: Rehabilitative and Restorative Service Providers"

## 2021-07-28 DIAGNOSIS — M542 Cervicalgia: Secondary | ICD-10-CM | POA: Diagnosis not present

## 2021-07-28 DIAGNOSIS — R293 Abnormal posture: Secondary | ICD-10-CM | POA: Diagnosis not present

## 2021-07-28 NOTE — Therapy (Signed)
[place] ?[facility] ?[address] ?[place], [place], [address] ?Phone: [phone]   Fax:  [phone] ? ?Physical Therapy Treatment ? ?Patient Details  ?Name: David Newman ?MRN: [id number] ?Date of Birth: June 20, 1960 ?Referring Provider (PT): Lynne Leader, MD ? ? ?Encounter Date: 07/28/2021 ? ? PT End of Session - 07/28/21 0809   ? ? Visit Number 15   ? Number of Visits 24   ? Date for PT Re-Evaluation 08/27/21   ? Authorization Type UMR   ? PT Start Time [phone]   ? PT Stop Time 0845   ? PT Time Calculation (min) 41 min   ? Activity Tolerance Patient tolerated treatment well   ? Behavior During Therapy [facility] for tasks assessed/performed   ? ?  ?  ? ?  ? ? ?Past Medical History:  ?Diagnosis Date  ? Anal fissure   ? At risk for sleep apnea   ? STOP-BANG= 5    SENT TO PCP 03-27-2014  ? Back pain   ? CAD (coronary artery disease)   ? Colon polyps   ? hyperplastic  ? DDD (degenerative disc disease), lumbar   ? GERD (gastroesophageal reflux disease)   ? Gout   ? High blood pressure   ? Hyperlipidemia   ? Joint pain   ? Knee pain   ? OA (osteoarthritis) of knee   ? Prediabetes   ? Right knee meniscal tear   ? Sleep apnea   ? Wears CPAP.  ? 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 Wears contact lenses   ? Wears glasses   ? ? ?Past Surgical History:  ?Procedure Laterality Date  ? COLONOSCOPY  04-24-2012  ? FACIAL RECONSTRUCTION SURGERY  1973  age 61  ? KNEE ARTHROSCOPY Right 03/29/2014  ? Procedure: RIGHT ARTHROSCOPY KNEE WITH DEBRIDMENT PARTIAL medial lateral MENISCECTOMY AND CHONDROPLASTY;  Surgeon: Sydnee Cabal, MD;  Location: [facility];  Service: Orthopedics;  Laterality: Right;  ? KNEE ARTHROSCOPY WITH ANTERIOR CRUCIATE LIGAMENT (ACL) REPAIR Right 1994  ? KNEE ARTHROSCOPY WITH MEDIAL MENISECTOMY Left 12/28/2018  ? Procedure: KNEE ARTHROSCOPY WITH partial MEDIAL MENISECTOMY, debridement chondroplasty;  Surgeon: Sydnee Cabal, MD;  Location: [facility];  Service: Orthopedics;  Laterality: Left;  with knee block  ? WISDOM TOOTH EXTRACTION  age 61  ? ? ?There were no vitals filed for this visit. ? ? Subjective Assessment - 07/28/21 0807   ? ? Subjective The patient notes he feels ready to finish therapy this week.  He is doing ther ex.   ? Patient Stated Goals Decrease tightness in neck/shoulder, decrease in numbness and tingling in the R thumb.   ? Currently in Pain? No/denies   ? ?  ?  ? ?  ? ? ? ? ? [facility] PT Assessment - 07/28/21 0810   ? ?  ? Assessment  ? Medical Diagnosis M25.512 (ICD-10-CM) - Left shoulder pain, unspecified chronicity  M54.2 (ICD-10-CM) - Neck pain on left side   ? Referring Provider (PT) Lynne Leader, MD   ? Onset Date/Surgical Date 04/27/21   ? ?  ?  ? ?  ? ? ? ? ? ? ? ? ? ? ? ? ? ? ? ? [facility] Adult PT Treatment/Exercise - 07/28/21 0810   ? ?  ? Self-Care  ? Self-Care Other Self-Care Comments   ? Other Self-Care Comments  discussed progression of resistance training post d/c from PT and made small updates to HEP   ?  ?  Exercises  ? Exercises Shoulder;Neck   ?  ? Neck Exercises: Machines for Strengthening  ? UBE (Upper Arm Bike) L5 x 2 minutes forward, 2 minutes backwards   ?  ? Shoulder Exercises: Prone  ? Other Prone Exercises plank x 3 reps on elbows, extended plank and push up trialed with some "soreness" L clavicle-- performed 3 reps.   ?  ? Shoulder Exercises: Standing  ? External Rotation Strengthening;Right;Left;10 reps   ? Theraband Level (Shoulder External Rotation) Level 3 (Green);Level 4 (Blue)   ? External Rotation Limitations blue at neutral, and greenw ith arm abducted.   ? Internal Rotation Strengthening   ? Theraband Level (Shoulder Internal Rotation) Level 4 (Blue)   ? ABduction Strengthening;Both;10 reps   ? Shoulder ABduction Weight (lbs) 5 lbs bilat   ? ABduction Limitations some soreness   ? Extension Strengthening;Both;12 reps   ? Theraband Level (Shoulder Extension) Level 4 (Blue)   ? Row  Strengthening;Both;12 reps   ? Other Standing Exercises bow and arrow blue x 12 reps   ? Other Standing Exercises scaption x 12 reps x 5 lbs; overhead press bilat 5 lbs x 12 reps   ?  ? Shoulder Exercises: Stretch  ? Corner Stretch Limitations door frame stretch arms Y and overhead for end range motion   ? Wall Stretch - ABduction 2 reps;20 seconds   ? Other Shoulder Stretches soft tissue rolling with ball in doorway   ? ?  ?  ? ?  ? ? ? ? ? ? ? ? ? ? ? ? ? ? ? PT Long Term Goals - 07/16/21 0804   ? ?  ? PT LONG TERM GOAL #1  ? Title The patinet will be indep with HEP and progress to return to independent and consistent exercise routine   ? Time 6   ? Period Weeks   ? Status New   ? Target Date 08/27/21   ?  ? PT LONG TERM GOAL #2  ? Title The patient will imrpove functional status score from 57% up to 71% to demo dec'd limitation.   ? Baseline 85% improved   ? Time 6   ? Period Weeks   ? Status Achieved   ? Target Date 07/21/21   ?  ? PT LONG TERM GOAL #3  ? Title The patient will report resolution of R thumb numbness and tingling.   ? Time 6   ? Period Weeks   ? Status Achieved   ? Target Date 07/21/21   ?  ? PT LONG TERM GOAL #4  ? Title The patient will tolerate laying on his left side without increased pain.   ? Baseline -   ? Period Weeks   ? Status Achieved   ? Target Date 07/21/21   ?  ? PT LONG TERM GOAL #5  ? Title The patient will demo improving posture and verbalize strategies to improve mechanics in work environment.   ? Baseline -   ? Time 6   ? Period Weeks   ? Status On-going   ? Target Date 08/27/21   ? ?  ?  ? ?  ? ? ? ? ? ? ? ? Plan - 07/28/21 0922   ? ? Clinical Impression Statement The patient is progressing through resistance activiites tolerating greater loading throughout UEs.  PT to d/c next visit with patient progressing HEP to add greater resistance.   ? PT Treatment/Interventions Taping;Dry needling;ADLs/Self Care Home Management;Patient/family education;Cryotherapy;Electrical  Stimulation;Moist  Heat;Traction;Therapeutic exercise;Therapeutic activities;Manual techniques;Passive range of motion   ? PT Next Visit Plan Anticipate continued treatment x 1-2 more weeks based on patient feedback-- wants to return to prior exercise routine and continue with DN as needed for 2 more weeks.  Progress HEP, postural strength and stretching, DN/manual work   ? PT Home Exercise Plan [id number]- updated on 2/28 based on what patient is currently doing-- has work activities (no resistance) and home routine.   ? Consulted and Agree with Plan of Care Patient   ? ?  ?  ? ?  ? ? ?Patient will benefit from skilled therapeutic intervention in order to improve the following deficits and impairments:    ? ?Visit Diagnosis: ?Cervicalgia ? ?Abnormal posture ? ? ? ? ?Problem List ?Patient Active Problem List  ? Diagnosis Date Noted  ? Aortic atherosclerosis [facility]), Cardiac CT 02/27/20 03/04/2020  ? Esophageal thickening, Cardiac CT 02/27/20 03/04/2020  ? Abnormal screening cardiac CT, 02/27/20, score 336, 89th% 03/04/2020  ? Visceral obesity 03/04/2020  ? Mixed hyperlipidemia, at goal, Rx Crestor 03/04/2020  ? Metabolic syndrome 14/78/2956  ? Vitamin D deficiency, at goal with supplementation 11/13/2019  ? B12 deficiency, at goal with supplementation 11/13/2019  ? Prediabetes 11/13/2019  ? 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  ? Low back pain 04/20/2018  ? Class 1 obesity with serious comorbidity and body mass index (BMI) of 32.0 to 32.9 in adult 08/05/2017  ? OSA on CPAP 04/05/2017  ? Ulcerative colitis ([place]) 11/04/2015  ? Adjustment insomnia 11/04/2015  ? Allergic rhinitis 11/03/2015  ? S/P right knee arthroscopy 03/29/2014  ? ? ?[place], [place] ?07/28/2021, 9:24 AM ? ?[place] ?[facility] ?[address] ?[place], [place], [address] ?Phone: [phone]   Fax:  [phone] ? ?Name: David Newman ?MRN: [id number] ?Date of Birth: 1960-10-21 ? ? ? ?

## 2021-07-30 ENCOUNTER — Encounter: Payer: Self-pay | Admitting: Rehabilitative and Restorative Service Providers"

## 2021-07-30 ENCOUNTER — Other Ambulatory Visit: Payer: Self-pay

## 2021-07-30 ENCOUNTER — Ambulatory Visit: Payer: 59 | Admitting: Rehabilitative and Restorative Service Providers"

## 2021-07-30 DIAGNOSIS — M542 Cervicalgia: Secondary | ICD-10-CM | POA: Diagnosis not present

## 2021-07-30 DIAGNOSIS — R293 Abnormal posture: Secondary | ICD-10-CM | POA: Diagnosis not present

## 2021-07-30 NOTE — Therapy (Signed)
[place] ?[facility] ?[address] ?[place], [place], [address] ?Phone: [phone]   Fax:  [phone] ? ?Physical Therapy Treatment and Discharge Summary ? ?PHYSICAL THERAPY DISCHARGE SUMMARY ? ?Visits from Start of Care: 16 ? ?Current functional level related to goals / functional outcomes: ?See progress note for discharge status  ?  ?Remaining deficits: ?Mild tightness to palpation Lt clavicular area and through the pecs  ?  ?Education / Equipment: ?HEP   ? ?Patient agrees to discharge. Patient goals were met. Patient is being discharged due to meeting the stated rehab goals. ? ?Perle Gibbon P. Helene Kelp PT, MPH ?07/30/21 9:05 AM ? ? ?Patient Details  ?Name: David Newman ?MRN: [id number] ?Date of Birth: 02-Apr-1961 ?Referring Provider (PT): Lynne Leader, MD ? ? ?Encounter Date: 07/30/2021 ? ? PT End of Session - 07/30/21 0804   ? ? Visit Number 16   ? Number of Visits 24   ? Date for PT Re-Evaluation 08/27/21   ? Authorization Type UMR   ? PT Start Time [phone]   ? PT Stop Time [phone]   ? PT Time Calculation (min) 49 min   ? Activity Tolerance Patient tolerated treatment well   ? ?  ?  ? ?  ? ? ?Past Medical History:  ?Diagnosis Date  ? Anal fissure   ? At risk for sleep apnea   ? STOP-BANG= 5    SENT TO PCP 03-27-2014  ? Back pain   ? CAD (coronary artery disease)   ? Colon polyps   ? hyperplastic  ? DDD (degenerative disc disease), lumbar   ? GERD (gastroesophageal reflux disease)   ? Gout   ? High blood pressure   ? Hyperlipidemia   ? Joint pain   ? Knee pain   ? OA (osteoarthritis) of knee   ? Prediabetes   ? Right knee meniscal tear   ? Sleep apnea   ? Wears CPAP.  ? 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 Wears contact lenses   ? Wears glasses   ? ? ?Past Surgical History:  ?Procedure Laterality Date  ? COLONOSCOPY  04-24-2012  ? FACIAL RECONSTRUCTION SURGERY  1973  age 20  ? KNEE ARTHROSCOPY Right 03/29/2014  ? Procedure: RIGHT ARTHROSCOPY KNEE WITH DEBRIDMENT PARTIAL  medial lateral MENISCECTOMY AND CHONDROPLASTY;  Surgeon: Sydnee Cabal, MD;  Location: [facility];  Service: Orthopedics;  Laterality: Right;  ? KNEE ARTHROSCOPY WITH ANTERIOR CRUCIATE LIGAMENT (ACL) REPAIR Right 1994  ? KNEE ARTHROSCOPY WITH MEDIAL MENISECTOMY Left 12/28/2018  ? Procedure: KNEE ARTHROSCOPY WITH partial MEDIAL MENISECTOMY, debridement chondroplasty;  Surgeon: Sydnee Cabal, MD;  Location: [facility];  Service: Orthopedics;  Laterality: Left;  with knee block  ? WISDOM TOOTH EXTRACTION  age 73  ? ? ?There were no vitals filed for this visit. ? ? Subjective Assessment - 07/30/21 0805   ? ? Subjective Doing OK - has increased activities with no increase in symptoms   ? Currently in Pain? No/denies   ? Pain Score 0-No pain   ? Pain Location Shoulder   ? ?  ?  ? ?  ? ? ? ? ? [facility] PT Assessment - 07/30/21 0001   ? ?  ? Assessment  ? Medical Diagnosis M25.512 (ICD-10-CM) - Left shoulder pain, unspecified chronicity  M54.2 (ICD-10-CM) - Neck pain on left side   ? Referring Provider (PT) Lynne Leader, MD   ? Onset Date/Surgical Date 04/27/21   ? Hand Dominance Right   ?  Prior Therapy none   ?  ? Observation/Other Assessments  ? Focus on Therapeutic Outcomes (FOTO)  83   ?  ? AROM  ? Cervical Flexion 74   ? Cervical Extension 62   ? Cervical - Right Side Bend 32   ? Cervical - Left Side Bend 38   ? Cervical - Right Rotation 65   ? Cervical - Left Rotation 65   ?  ? Strength  ? Right Shoulder Flexion 5/5   ? Right Shoulder ABduction 5/5   ? Left Shoulder Flexion 5/5   ? Left Shoulder ABduction 5/5   ? Right Elbow Flexion 5/5   ? Right Elbow Extension 5/5   ? Left Elbow Flexion 5/5   ? Left Elbow Extension 5/5   ? Right Hand Grip (lbs) 125   ? Right Hand Lateral Pinch 17 lbs   ? Left Hand Grip (lbs) 115   ? Left Hand Lateral Pinch 15 lbs   ?  ? Palpation  ? Palpation comment minimal tightness anterior lateral cervical musculature; clavicular area into pecs/anterior chest   ? ?  ?   ? ?  ? ? ? ? ? ? ? ? ? ? ? ? ? ? ? ? [facility] Adult PT Treatment/Exercise - 07/30/21 0001   ? ?  ? Neck Exercises: Machines for Strengthening  ? UBE (Upper Arm Bike) L5 x 2.5 minutes forward, 2.5 minutes backwards   ?  ? Neck Exercises: Stretches  ? Other Neck Stretches T at wall arm straight and elbow at 90 deg elbow flexion 30 sec x 3 reps each   ?  ? Shoulder Exercises: Stretch  ? Corner Stretch Limitations door frame stretch arms Y and overhead for end range motion   ? Wall Stretch - ABduction 2 reps;20 seconds   ? Wall Stretch - ABduction Limitations horizontal abduction   ? Other Shoulder Stretches doorway stretch x 3 positions   ?  ? Manual Therapy  ? Soft tissue mobilization STM through pecs; anterior chest; deltoid; biceps; and upper trap musculature   ? Myofascial Release anterior chest   ? Passive ROM Lt shoulder extension and horizontal abduction, ER 90/90; biceps stretch pt supine UE off edge of table   ? ?  ?  ? ?  ? ? ? ? ? ? ? ? ? ? ? ? ? ? ? PT Long Term Goals - 07/30/21 0902   ? ?  ? PT LONG TERM GOAL #1  ? Title The patinet will be indep with HEP and progress to return to independent and consistent exercise routine   ? Time 6   ? Period Weeks   ? Status Achieved   ? Target Date 08/27/21   ?  ? PT LONG TERM GOAL #2  ? Title The patient will imrpove functional status score from 57% up to 71% to demo dec'd limitation.   ? Baseline 85-90% improved   ? Period Weeks   ? Status Achieved   ? Target Date 07/21/21   ?  ? PT LONG TERM GOAL #3  ? Title The patient will report resolution of R thumb numbness and tingling.   ? Time 6   ? Period Weeks   ? Status Achieved   ?  ? PT LONG TERM GOAL #4  ? Title The patient will tolerate laying on his left side without increased pain.   ? Time 6   ? Period Weeks   ? Status Achieved   ?  Target Date 07/21/21   ?  ? PT LONG TERM GOAL #5  ? Title The patient will demo improving posture and verbalize strategies to improve mechanics in work environment.   ? Time 6   ? Period  Weeks   ? Status Achieved   ? Target Date 08/27/21   ? ?  ?  ? ?  ? ? ? ? ? ? ? ? Plan - 07/30/21 0859   ? ? Clinical Impression Statement Gradual progress with course of rehab. Patient is now pain free with good ROM/mobility through the cervical spine and shoulders. He has some continued tightness to palpation through the Lt clavicular and upper trap area as well as through the pecs. Karn Pickler is independent in HEP and will continue with exercise including stretching and strengthening. Goals of therapy have been accomplished.   ? Rehab Potential Good   ? PT Frequency 2x / week   ? PT Duration 6 weeks   ? PT Treatment/Interventions Taping;Dry needling;ADLs/Self Care Home Management;Patient/family education;Cryotherapy;Electrical Stimulation;Moist Heat;Traction;Therapeutic exercise;Therapeutic activities;Manual techniques;Passive range of motion   ? PT Next Visit Plan d/c to independent HEP   ? PT Home Exercise Plan [id number].   ? Consulted and Agree with Plan of Care Patient   ? ?  ?  ? ?  ? ? ?Patient will benefit from skilled therapeutic intervention in order to improve the following deficits and impairments:    ? ?Visit Diagnosis: ?Cervicalgia ? ?Abnormal posture ? ? ? ? ?Problem List ?Patient Active Problem List  ? Diagnosis Date Noted  ? Aortic atherosclerosis [facility]), Cardiac CT 02/27/20 03/04/2020  ? Esophageal thickening, Cardiac CT 02/27/20 03/04/2020  ? Abnormal screening cardiac CT, 02/27/20, score 336, 89th% 03/04/2020  ? Visceral obesity 03/04/2020  ? Mixed hyperlipidemia, at goal, Rx Crestor 03/04/2020  ? Metabolic syndrome 40/97/3532  ? Vitamin D deficiency, at goal with supplementation 11/13/2019  ? B12 deficiency, at goal with supplementation 11/13/2019  ? Prediabetes 11/13/2019  ? 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  ? Low back pain 04/20/2018  ? Class 1 obesity with serious comorbidity and body mass index (BMI) of 32.0 to 32.9 in adult 08/05/2017  ? OSA on CPAP 04/05/2017  ? Ulcerative colitis  ([place]) 11/04/2015  ? Adjustment insomnia 11/04/2015  ? Allergic rhinitis 11/03/2015  ? S/P right knee arthroscopy 03/29/2014  ? ? ?Everardo All, PT, MPH  ?07/30/2021, 9:03 AM ? ?[place] ?[facility]

## 2021-08-04 ENCOUNTER — Other Ambulatory Visit (HOSPITAL_COMMUNITY): Payer: Self-pay

## 2021-08-05 ENCOUNTER — Ambulatory Visit (INDEPENDENT_AMBULATORY_CARE_PROVIDER_SITE_OTHER): Payer: 59 | Admitting: Family Medicine

## 2021-08-05 ENCOUNTER — Other Ambulatory Visit (HOSPITAL_COMMUNITY): Payer: Self-pay

## 2021-08-05 ENCOUNTER — Other Ambulatory Visit: Payer: Self-pay

## 2021-08-05 ENCOUNTER — Encounter (INDEPENDENT_AMBULATORY_CARE_PROVIDER_SITE_OTHER): Payer: Self-pay | Admitting: Family Medicine

## 2021-08-05 VITALS — BP 134/82 | HR 67 | Temp 98.0°F | Ht 71.0 in | Wt 231.0 lb

## 2021-08-05 DIAGNOSIS — R7303 Prediabetes: Secondary | ICD-10-CM | POA: Diagnosis not present

## 2021-08-05 DIAGNOSIS — F418 Other specified anxiety disorders: Secondary | ICD-10-CM | POA: Diagnosis not present

## 2021-08-05 DIAGNOSIS — M25512 Pain in left shoulder: Secondary | ICD-10-CM | POA: Diagnosis not present

## 2021-08-05 DIAGNOSIS — Z6832 Body mass index (BMI) 32.0-32.9, adult: Secondary | ICD-10-CM

## 2021-08-05 DIAGNOSIS — E669 Obesity, unspecified: Secondary | ICD-10-CM

## 2021-08-05 DIAGNOSIS — E66811 Obesity, class 1: Secondary | ICD-10-CM

## 2021-08-05 MED ORDER — TIRZEPATIDE 7.5 MG/0.5ML ~~LOC~~ SOAJ
7.5000 mg | SUBCUTANEOUS | 1 refills | Status: DC
  Filled 2021-08-05: qty 6, 84d supply, fill #0
  Filled 2021-08-05: qty 2, 28d supply, fill #0

## 2021-08-05 MED ORDER — CYCLOBENZAPRINE HCL 5 MG PO TABS
5.0000 mg | ORAL_TABLET | Freq: Three times a day (TID) | ORAL | 1 refills | Status: DC | PRN
  Filled 2021-08-05: qty 20, 7d supply, fill #0

## 2021-08-06 ENCOUNTER — Ambulatory Visit (INDEPENDENT_AMBULATORY_CARE_PROVIDER_SITE_OTHER): Payer: 59 | Admitting: Family Medicine

## 2021-08-17 NOTE — Progress Notes (Signed)
Chief Complaint:   OBESITY David Newman is here to discuss his progress with his obesity treatment plan along with follow-up of his obesity related diagnoses. See Medical Weight Management Flowsheet for complete bioelectrical impedance results.  Today's visit was #: 71 Starting weight: 249 lbs Starting date: 10/16/2019 Weight change since last visit: +1 lbs Total lbs lost to date: 18 lbs Total weight loss percentage to date: 7.23%  Nutrition Plan: 1800-2200 & 135 grams Activity: strength & cardio  Anti-obesity medications: none.   Interim History: David Newman is here for his follow up and he is up 1 pound since his last office visit. He has been adhering to 1800-2200 calories and 135 grams of protein each day, and he says that he has been following that plan approximately 60-70% of the time. David Newman also says that he has been doing cardio and strength training for 20-30 minutes 4 days per week. Since his last office visit, he has been on a fishing trip.   Assessment/Plan:   1. Prediabetes, with polyphagia At goal. Goal is HgbA1c < 5.7.  Medication: Mounjaro 15m Plan: Discontinue Mounjaro 5 mg subcutaneously weekly, and we will increase to 7.525mweekly of Mounjaro. Will refill today. He will continue to focus on protein-rich, low simple carbohydrate foods. We reviewed the importance of hydration, regular exercise for stress reduction, and restorative sleep.  Refill as per below: Refill: tirzepatide (MOUNJARO) 7.5 MG/0.5ML Pen; Inject 7.5 mg into the skin once a week.  Dispense: 6 mL; Refill: 1  2. Acute pain of left shoulder [facility]'s acute pain is improving still with his current plan of treatment.    Plan:  Refill Flexeril 5 mg three times daily as needed for muscle spasms.   Refill as per below: - Refill cyclobenzaprine (FLEXERIL) 5 MG tablet; Take 1 tablet (5 mg total) by mouth 3 (three) times daily as needed for muscle spasms.  Dispense: 20 tablet; Refill: 1  3. Situational  anxiety Behavior modification techniques were discussed again today to help David Dustontinue to deal with his anxiety.   4. Obesity, current BMI 32.3  Course: David Dusts currently in the action stage of change. As such, his goal is to continue with weight loss efforts.   Nutrition goals: He has agreed to keeping a food journal and adhering to recommended goals of 1800-2200 calories and 135 grams of protein.   Exercise goals: As is.  Behavioral modification strategies: increasing lean protein intake, decreasing simple carbohydrates, increasing vegetables, and increasing water intake.  David Dustas agreed to follow-up with our clinic in 3 weeks. He was informed of the importance of frequent follow-up visits to maximize his success with intensive lifestyle modifications for his multiple health conditions.   Objective:   Blood pressure 134/82, pulse 67, temperature 98 F (36.7 C), temperature source Oral, height 5' 11"  (1.803 m), weight 231 lb (104.8 kg), SpO2 98 %. Body mass index is 32.22 kg/m.  General: Cooperative, alert, well developed, in no acute distress. HEENT: Conjunctivae and lids unremarkable. Cardiovascular: Regular rhythm.  Lungs: Normal work of breathing. Neurologic: No focal deficits.   Lab Results  Component Value Date   CREATININE 1.06 01/29/2021   BUN 19 01/29/2021   NA 139 01/29/2021   K 4.4 01/29/2021   CL 103 01/29/2021   CO2 22 01/29/2021   Lab Results  Component Value Date   ALT 29 01/29/2021   AST 25 01/29/2021   ALKPHOS 85 01/29/2021   BILITOT 0.5 01/29/2021   Lab Results  Component Value Date   HGBA1C 5.5 01/29/2021   HGBA1C 5.5 01/23/2020   Lab Results  Component Value Date   INSULIN 18.1 01/29/2021   INSULIN 11.0 06/09/2020   Lab Results  Component Value Date   TSH 1.740 10/16/2019   Lab Results  Component Value Date   CHOL 173 01/29/2021   HDL 56 01/29/2021   LDLCALC 92 01/29/2021   TRIG 141 01/29/2021   CHOLHDL 3.1  01/29/2021   Lab Results  Component Value Date   VD25OH 76.3 01/29/2021   VD25OH 52.1 06/09/2020   VD25OH 45.7 01/23/2020   Lab Results  Component Value Date   WBC 5.7 01/29/2021   HGB 15.1 01/29/2021   HCT 43.5 01/29/2021   MCV 88 01/29/2021   PLT 232 01/29/2021   No results found for: IRON, TIBC, FERRITIN  Attestation Statements:   Reviewed by clinician on day of visit: allergies, medications, problem list, medical history, surgical history, family history, social history, and previous encounter notes.  I, Wyatt Haste, LPN, am acting as Location manager for [employer], DO.  I have reviewed the above documentation for accuracy and completeness, and I agree with the above. -  Briscoe Deutscher, DO, MS, FAAFP, DABOM - Family and Bariatric Medicine.

## 2021-08-20 ENCOUNTER — Other Ambulatory Visit (HOSPITAL_COMMUNITY): Payer: Self-pay

## 2021-08-25 ENCOUNTER — Ambulatory Visit (INDEPENDENT_AMBULATORY_CARE_PROVIDER_SITE_OTHER): Payer: 59 | Admitting: Family Medicine

## 2021-08-25 ENCOUNTER — Encounter (INDEPENDENT_AMBULATORY_CARE_PROVIDER_SITE_OTHER): Payer: Self-pay

## 2021-09-14 DIAGNOSIS — K5190 Ulcerative colitis, unspecified, without complications: Secondary | ICD-10-CM | POA: Diagnosis not present

## 2021-09-14 DIAGNOSIS — R7303 Prediabetes: Secondary | ICD-10-CM | POA: Diagnosis not present

## 2021-09-14 DIAGNOSIS — E559 Vitamin D deficiency, unspecified: Secondary | ICD-10-CM | POA: Diagnosis not present

## 2021-09-14 DIAGNOSIS — R632 Polyphagia: Secondary | ICD-10-CM | POA: Diagnosis not present

## 2021-09-14 DIAGNOSIS — Z6832 Body mass index (BMI) 32.0-32.9, adult: Secondary | ICD-10-CM | POA: Diagnosis not present

## 2021-09-14 DIAGNOSIS — Z9189 Other specified personal risk factors, not elsewhere classified: Secondary | ICD-10-CM | POA: Diagnosis not present

## 2021-09-14 DIAGNOSIS — E669 Obesity, unspecified: Secondary | ICD-10-CM | POA: Diagnosis not present

## 2021-09-23 ENCOUNTER — Other Ambulatory Visit (HOSPITAL_COMMUNITY): Payer: Self-pay

## 2021-10-01 DIAGNOSIS — R7303 Prediabetes: Secondary | ICD-10-CM | POA: Diagnosis not present

## 2021-10-01 DIAGNOSIS — E7849 Other hyperlipidemia: Secondary | ICD-10-CM | POA: Diagnosis not present

## 2021-10-01 DIAGNOSIS — R632 Polyphagia: Secondary | ICD-10-CM | POA: Diagnosis not present

## 2021-10-01 DIAGNOSIS — K5190 Ulcerative colitis, unspecified, without complications: Secondary | ICD-10-CM | POA: Diagnosis not present

## 2021-10-01 DIAGNOSIS — M542 Cervicalgia: Secondary | ICD-10-CM | POA: Diagnosis not present

## 2021-10-01 DIAGNOSIS — E559 Vitamin D deficiency, unspecified: Secondary | ICD-10-CM | POA: Diagnosis not present

## 2021-10-01 DIAGNOSIS — E669 Obesity, unspecified: Secondary | ICD-10-CM | POA: Diagnosis not present

## 2021-10-01 DIAGNOSIS — Z9189 Other specified personal risk factors, not elsewhere classified: Secondary | ICD-10-CM | POA: Diagnosis not present

## 2021-10-26 ENCOUNTER — Other Ambulatory Visit (HOSPITAL_COMMUNITY): Payer: Self-pay

## 2021-10-26 DIAGNOSIS — D2261 Melanocytic nevi of right upper limb, including shoulder: Secondary | ICD-10-CM | POA: Diagnosis not present

## 2021-10-26 DIAGNOSIS — D1801 Hemangioma of skin and subcutaneous tissue: Secondary | ICD-10-CM | POA: Diagnosis not present

## 2021-10-26 DIAGNOSIS — B353 Tinea pedis: Secondary | ICD-10-CM | POA: Diagnosis not present

## 2021-10-26 DIAGNOSIS — L821 Other seborrheic keratosis: Secondary | ICD-10-CM | POA: Diagnosis not present

## 2021-10-26 DIAGNOSIS — D2262 Melanocytic nevi of left upper limb, including shoulder: Secondary | ICD-10-CM | POA: Diagnosis not present

## 2021-10-26 DIAGNOSIS — L718 Other rosacea: Secondary | ICD-10-CM | POA: Diagnosis not present

## 2021-10-26 DIAGNOSIS — D225 Melanocytic nevi of trunk: Secondary | ICD-10-CM | POA: Diagnosis not present

## 2021-10-26 DIAGNOSIS — L400 Psoriasis vulgaris: Secondary | ICD-10-CM | POA: Diagnosis not present

## 2021-10-26 MED ORDER — BETAMETHASONE DIPROPIONATE 0.05 % EX LOTN
1.0000 "application " | TOPICAL_LOTION | Freq: Every day | CUTANEOUS | 2 refills | Status: DC
  Filled 2021-10-26: qty 60, 30d supply, fill #0

## 2021-10-26 MED ORDER — NAFTIFINE HCL 2 % EX GEL
1.0000 "application " | Freq: Every day | CUTANEOUS | 0 refills | Status: DC
  Filled 2021-10-26: qty 45, 30d supply, fill #0

## 2021-10-27 ENCOUNTER — Other Ambulatory Visit (HOSPITAL_COMMUNITY): Payer: Self-pay

## 2021-10-27 MED ORDER — METRONIDAZOLE 1 % EX GEL
1.0000 "application " | Freq: Every day | CUTANEOUS | 5 refills | Status: DC
  Filled 2021-10-27: qty 60, 30d supply, fill #0
  Filled 2022-07-07: qty 60, 30d supply, fill #1

## 2021-10-28 ENCOUNTER — Other Ambulatory Visit (HOSPITAL_COMMUNITY): Payer: Self-pay

## 2021-10-28 DIAGNOSIS — E782 Mixed hyperlipidemia: Secondary | ICD-10-CM | POA: Diagnosis not present

## 2021-10-28 DIAGNOSIS — E669 Obesity, unspecified: Secondary | ICD-10-CM | POA: Diagnosis not present

## 2021-10-28 DIAGNOSIS — Z9189 Other specified personal risk factors, not elsewhere classified: Secondary | ICD-10-CM | POA: Diagnosis not present

## 2021-10-28 DIAGNOSIS — I2510 Atherosclerotic heart disease of native coronary artery without angina pectoris: Secondary | ICD-10-CM | POA: Diagnosis not present

## 2021-10-28 DIAGNOSIS — M542 Cervicalgia: Secondary | ICD-10-CM | POA: Diagnosis not present

## 2021-10-28 DIAGNOSIS — K5180 Other ulcerative colitis without complications: Secondary | ICD-10-CM | POA: Diagnosis not present

## 2021-10-28 DIAGNOSIS — R7303 Prediabetes: Secondary | ICD-10-CM | POA: Diagnosis not present

## 2021-10-28 MED ORDER — MOUNJARO 7.5 MG/0.5ML ~~LOC~~ SOAJ
7.5000 mg | SUBCUTANEOUS | 3 refills | Status: DC
Start: 2021-10-28 — End: 2021-11-12
  Filled 2021-10-28: qty 2, 28d supply, fill #0

## 2021-11-10 ENCOUNTER — Other Ambulatory Visit (HOSPITAL_COMMUNITY): Payer: Self-pay

## 2021-11-10 MED ORDER — LORAZEPAM 1 MG PO TABS
0.5000 mg | ORAL_TABLET | Freq: Every evening | ORAL | 5 refills | Status: DC | PRN
  Filled 2021-11-10: qty 45, 30d supply, fill #0
  Filled 2021-12-17: qty 45, 30d supply, fill #1
  Filled 2022-01-21: qty 45, 30d supply, fill #2
  Filled 2022-03-02: qty 45, 30d supply, fill #3
  Filled 2022-04-06: qty 45, 30d supply, fill #4
  Filled 2022-05-04: qty 45, 30d supply, fill #5

## 2021-11-12 ENCOUNTER — Other Ambulatory Visit (HOSPITAL_COMMUNITY): Payer: Self-pay

## 2021-11-12 MED ORDER — WEGOVY 1 MG/0.5ML ~~LOC~~ SOAJ
SUBCUTANEOUS | 2 refills | Status: DC
  Filled 2021-11-12: qty 2, 28d supply, fill #0

## 2021-11-13 ENCOUNTER — Other Ambulatory Visit (HOSPITAL_COMMUNITY): Payer: Self-pay

## 2021-11-16 ENCOUNTER — Other Ambulatory Visit (HOSPITAL_COMMUNITY): Payer: Self-pay

## 2021-11-18 ENCOUNTER — Other Ambulatory Visit (HOSPITAL_COMMUNITY): Payer: Self-pay

## 2021-11-18 MED ORDER — WEGOVY 1 MG/0.5ML ~~LOC~~ SOAJ
1.0000 mg | SUBCUTANEOUS | 2 refills | Status: DC
  Filled 2021-11-18 – 2021-12-10 (×2): qty 2, 28d supply, fill #0

## 2021-11-19 ENCOUNTER — Other Ambulatory Visit (HOSPITAL_COMMUNITY): Payer: Self-pay

## 2021-12-01 ENCOUNTER — Other Ambulatory Visit (HOSPITAL_COMMUNITY): Payer: Self-pay

## 2021-12-01 DIAGNOSIS — D519 Vitamin B12 deficiency anemia, unspecified: Secondary | ICD-10-CM | POA: Diagnosis not present

## 2021-12-01 DIAGNOSIS — E559 Vitamin D deficiency, unspecified: Secondary | ICD-10-CM | POA: Diagnosis not present

## 2021-12-01 DIAGNOSIS — F3289 Other specified depressive episodes: Secondary | ICD-10-CM | POA: Diagnosis not present

## 2021-12-01 DIAGNOSIS — Z6832 Body mass index (BMI) 32.0-32.9, adult: Secondary | ICD-10-CM | POA: Diagnosis not present

## 2021-12-01 DIAGNOSIS — R632 Polyphagia: Secondary | ICD-10-CM | POA: Diagnosis not present

## 2021-12-01 DIAGNOSIS — M25512 Pain in left shoulder: Secondary | ICD-10-CM | POA: Diagnosis not present

## 2021-12-01 MED ORDER — WEGOVY 1 MG/0.5ML ~~LOC~~ SOAJ
1.0000 mg | SUBCUTANEOUS | 2 refills | Status: DC
  Filled 2021-12-01 – 2021-12-10 (×2): qty 2, 28d supply, fill #0

## 2021-12-01 MED ORDER — BUPROPION HCL ER (SR) 100 MG PO TB12
100.0000 mg | ORAL_TABLET | Freq: Every morning | ORAL | 0 refills | Status: DC
  Filled 2021-12-01: qty 30, 30d supply, fill #0

## 2021-12-01 MED ORDER — VITAMIN D3 1.25 MG (50000 UT) PO CAPS
50000.0000 [IU] | ORAL_CAPSULE | ORAL | 0 refills | Status: DC
  Filled 2021-12-01: qty 12, 84d supply, fill #0

## 2021-12-01 MED ORDER — "BD INSULIN SYRINGE MICROFINE 27G X 5/8"" 1 ML MISC": 0 refills | Status: DC

## 2021-12-01 MED ORDER — CYCLOBENZAPRINE HCL 5 MG PO TABS
5.0000 mg | ORAL_TABLET | Freq: Every evening | ORAL | 0 refills | Status: DC | PRN
  Filled 2021-12-01: qty 30, 30d supply, fill #0

## 2021-12-10 ENCOUNTER — Other Ambulatory Visit (HOSPITAL_COMMUNITY): Payer: Self-pay

## 2021-12-14 ENCOUNTER — Other Ambulatory Visit (HOSPITAL_COMMUNITY): Payer: Self-pay

## 2021-12-14 MED ORDER — WEGOVY 1.7 MG/0.75ML ~~LOC~~ SOAJ
1.7000 mg | SUBCUTANEOUS | 2 refills | Status: DC
  Filled 2021-12-14: qty 3, 28d supply, fill #0

## 2021-12-16 ENCOUNTER — Other Ambulatory Visit (HOSPITAL_COMMUNITY): Payer: Self-pay

## 2021-12-16 DIAGNOSIS — R7303 Prediabetes: Secondary | ICD-10-CM | POA: Diagnosis not present

## 2021-12-16 DIAGNOSIS — D519 Vitamin B12 deficiency anemia, unspecified: Secondary | ICD-10-CM | POA: Diagnosis not present

## 2021-12-16 DIAGNOSIS — E669 Obesity, unspecified: Secondary | ICD-10-CM | POA: Diagnosis not present

## 2021-12-16 DIAGNOSIS — E65 Localized adiposity: Secondary | ICD-10-CM | POA: Diagnosis not present

## 2021-12-16 DIAGNOSIS — G4700 Insomnia, unspecified: Secondary | ICD-10-CM | POA: Diagnosis not present

## 2021-12-16 DIAGNOSIS — R632 Polyphagia: Secondary | ICD-10-CM | POA: Diagnosis not present

## 2021-12-16 MED ORDER — TRAZODONE HCL 50 MG PO TABS
50.0000 mg | ORAL_TABLET | Freq: Every evening | ORAL | 0 refills | Status: DC | PRN
  Filled 2021-12-16: qty 30, 30d supply, fill #0

## 2021-12-16 MED ORDER — CYANOCOBALAMIN 1000 MCG/ML IJ SOLN
1000.0000 ug | INTRAMUSCULAR | 3 refills | Status: DC
  Filled 2021-12-16: qty 4, 28d supply, fill #0

## 2021-12-16 MED ORDER — "INSULIN SYRINGE-NEEDLE U-100 27G X 5/8"" 1 ML MISC"
1.0000 | 0 refills | Status: DC
  Filled 2021-12-16: qty 12, 12d supply, fill #0

## 2021-12-16 MED ORDER — WEGOVY 1 MG/0.5ML ~~LOC~~ SOAJ
1.0000 mg | SUBCUTANEOUS | 2 refills | Status: DC
  Filled 2021-12-16 – 2022-01-07 (×2): qty 2, 28d supply, fill #0
  Filled 2022-02-05: qty 2, 28d supply, fill #1
  Filled 2022-03-08 – 2022-03-11 (×2): qty 2, 28d supply, fill #2

## 2021-12-18 ENCOUNTER — Other Ambulatory Visit (HOSPITAL_COMMUNITY): Payer: Self-pay

## 2021-12-23 ENCOUNTER — Encounter (INDEPENDENT_AMBULATORY_CARE_PROVIDER_SITE_OTHER): Payer: Self-pay

## 2021-12-27 ENCOUNTER — Other Ambulatory Visit (HOSPITAL_COMMUNITY): Payer: Self-pay

## 2021-12-28 ENCOUNTER — Other Ambulatory Visit (HOSPITAL_COMMUNITY): Payer: Self-pay

## 2021-12-28 MED ORDER — BUPROPION HCL ER (SR) 100 MG PO TB12
100.0000 mg | ORAL_TABLET | Freq: Every morning | ORAL | 0 refills | Status: DC
  Filled 2021-12-28: qty 30, 30d supply, fill #0

## 2022-01-07 ENCOUNTER — Other Ambulatory Visit (HOSPITAL_COMMUNITY): Payer: Self-pay

## 2022-01-13 ENCOUNTER — Other Ambulatory Visit (HOSPITAL_COMMUNITY): Payer: Self-pay

## 2022-01-13 MED ORDER — TRAZODONE HCL 50 MG PO TABS
50.0000 mg | ORAL_TABLET | Freq: Every evening | ORAL | 3 refills | Status: DC | PRN
  Filled 2022-01-13: qty 90, 90d supply, fill #0
  Filled 2022-04-10: qty 90, 90d supply, fill #1
  Filled 2022-07-07: qty 90, 90d supply, fill #2

## 2022-01-14 ENCOUNTER — Other Ambulatory Visit (HOSPITAL_COMMUNITY): Payer: Self-pay

## 2022-01-19 DIAGNOSIS — E669 Obesity, unspecified: Secondary | ICD-10-CM | POA: Diagnosis not present

## 2022-01-19 DIAGNOSIS — Z6831 Body mass index (BMI) 31.0-31.9, adult: Secondary | ICD-10-CM | POA: Diagnosis not present

## 2022-01-19 DIAGNOSIS — R7303 Prediabetes: Secondary | ICD-10-CM | POA: Diagnosis not present

## 2022-01-19 DIAGNOSIS — E65 Localized adiposity: Secondary | ICD-10-CM | POA: Diagnosis not present

## 2022-01-19 DIAGNOSIS — F418 Other specified anxiety disorders: Secondary | ICD-10-CM | POA: Diagnosis not present

## 2022-01-19 DIAGNOSIS — R632 Polyphagia: Secondary | ICD-10-CM | POA: Diagnosis not present

## 2022-01-21 ENCOUNTER — Other Ambulatory Visit (HOSPITAL_COMMUNITY): Payer: Self-pay

## 2022-01-31 ENCOUNTER — Other Ambulatory Visit (HOSPITAL_COMMUNITY): Payer: Self-pay

## 2022-01-31 MED ORDER — BUPROPION HCL ER (SR) 100 MG PO TB12
100.0000 mg | ORAL_TABLET | Freq: Every day | ORAL | 3 refills | Status: DC
Start: 2022-01-31 — End: 2023-03-29
  Filled 2022-01-31: qty 90, 90d supply, fill #0
  Filled 2022-05-04: qty 90, 90d supply, fill #1
  Filled 2022-08-02: qty 90, 90d supply, fill #2
  Filled 2023-01-30: qty 90, 90d supply, fill #3

## 2022-02-01 ENCOUNTER — Other Ambulatory Visit (HOSPITAL_COMMUNITY): Payer: Self-pay

## 2022-02-05 ENCOUNTER — Other Ambulatory Visit (HOSPITAL_COMMUNITY): Payer: Self-pay

## 2022-02-08 ENCOUNTER — Other Ambulatory Visit (HOSPITAL_COMMUNITY): Payer: Self-pay

## 2022-02-16 DIAGNOSIS — E669 Obesity, unspecified: Secondary | ICD-10-CM | POA: Diagnosis not present

## 2022-02-16 DIAGNOSIS — R632 Polyphagia: Secondary | ICD-10-CM | POA: Diagnosis not present

## 2022-02-16 DIAGNOSIS — Z6830 Body mass index (BMI) 30.0-30.9, adult: Secondary | ICD-10-CM | POA: Diagnosis not present

## 2022-02-16 DIAGNOSIS — R7303 Prediabetes: Secondary | ICD-10-CM | POA: Diagnosis not present

## 2022-02-16 DIAGNOSIS — E65 Localized adiposity: Secondary | ICD-10-CM | POA: Diagnosis not present

## 2022-03-02 ENCOUNTER — Other Ambulatory Visit (HOSPITAL_COMMUNITY): Payer: Self-pay

## 2022-03-11 ENCOUNTER — Other Ambulatory Visit (HOSPITAL_COMMUNITY): Payer: Self-pay

## 2022-03-11 ENCOUNTER — Other Ambulatory Visit (HOSPITAL_BASED_OUTPATIENT_CLINIC_OR_DEPARTMENT_OTHER): Payer: Self-pay

## 2022-03-12 ENCOUNTER — Encounter: Payer: Self-pay | Admitting: Internal Medicine

## 2022-03-16 DIAGNOSIS — Z6831 Body mass index (BMI) 31.0-31.9, adult: Secondary | ICD-10-CM | POA: Diagnosis not present

## 2022-03-16 DIAGNOSIS — R7303 Prediabetes: Secondary | ICD-10-CM | POA: Diagnosis not present

## 2022-03-16 DIAGNOSIS — E538 Deficiency of other specified B group vitamins: Secondary | ICD-10-CM | POA: Diagnosis not present

## 2022-03-16 DIAGNOSIS — E669 Obesity, unspecified: Secondary | ICD-10-CM | POA: Diagnosis not present

## 2022-03-16 DIAGNOSIS — E559 Vitamin D deficiency, unspecified: Secondary | ICD-10-CM | POA: Diagnosis not present

## 2022-03-22 ENCOUNTER — Other Ambulatory Visit (HOSPITAL_COMMUNITY): Payer: Self-pay

## 2022-03-22 ENCOUNTER — Encounter: Payer: Self-pay | Admitting: Internal Medicine

## 2022-03-22 ENCOUNTER — Telehealth: Payer: Self-pay | Admitting: Internal Medicine

## 2022-03-22 MED ORDER — VITAMIN D3 1.25 MG (50000 UT) PO CAPS
1.0000 | ORAL_CAPSULE | ORAL | 0 refills | Status: DC
  Filled 2022-03-22 – 2022-07-07 (×2): qty 12, 84d supply, fill #0

## 2022-03-22 MED ORDER — WEGOVY 1 MG/0.5ML ~~LOC~~ SOAJ
1.0000 mg | SUBCUTANEOUS | 1 refills | Status: DC
  Filled 2022-03-22: qty 6, 84d supply, fill #0
  Filled 2022-04-06: qty 2, 28d supply, fill #0

## 2022-03-22 NOTE — Telephone Encounter (Signed)
Patient called states he would like to speak with the nurse regarding his appt. States he was told back in 2021 that his recall will be in 5 years. However we sent him a letter for him that it was time for him to get a colon. Patient states that he doesn't think it right. Requesting a call for clarification. Please call to advise.

## 2022-03-23 NOTE — Telephone Encounter (Signed)
The correct recall date should be December 2026.  Please make a note.  Please let the patient know.  Thanks

## 2022-03-23 NOTE — Telephone Encounter (Signed)
Pt calling as he received a recall letter for a colon. Last colon was done in 2021 and he was to have a 5 year recall. Pt does not think he is due for repeat colon. Dr. Henrene Pastor please advise if something has changed.

## 2022-03-23 NOTE — Telephone Encounter (Signed)
Left detailed message on pts identified voicemail that the recall letter was incorrect and pt is not due until 04/2025. New recall entered in epic and previsit and colon appt cancelled.

## 2022-03-26 ENCOUNTER — Other Ambulatory Visit (HOSPITAL_COMMUNITY): Payer: Self-pay

## 2022-03-29 ENCOUNTER — Other Ambulatory Visit (HOSPITAL_COMMUNITY): Payer: Self-pay

## 2022-04-06 ENCOUNTER — Other Ambulatory Visit (HOSPITAL_COMMUNITY): Payer: Self-pay

## 2022-04-12 ENCOUNTER — Other Ambulatory Visit (HOSPITAL_COMMUNITY): Payer: Self-pay

## 2022-04-14 ENCOUNTER — Other Ambulatory Visit (HOSPITAL_COMMUNITY): Payer: Self-pay

## 2022-04-14 DIAGNOSIS — E559 Vitamin D deficiency, unspecified: Secondary | ICD-10-CM | POA: Diagnosis not present

## 2022-04-14 DIAGNOSIS — R632 Polyphagia: Secondary | ICD-10-CM | POA: Diagnosis not present

## 2022-04-14 DIAGNOSIS — E669 Obesity, unspecified: Secondary | ICD-10-CM | POA: Diagnosis not present

## 2022-04-14 DIAGNOSIS — Z6830 Body mass index (BMI) 30.0-30.9, adult: Secondary | ICD-10-CM | POA: Diagnosis not present

## 2022-04-14 DIAGNOSIS — K5190 Ulcerative colitis, unspecified, without complications: Secondary | ICD-10-CM | POA: Diagnosis not present

## 2022-04-14 DIAGNOSIS — D519 Vitamin B12 deficiency anemia, unspecified: Secondary | ICD-10-CM | POA: Diagnosis not present

## 2022-04-15 ENCOUNTER — Other Ambulatory Visit (HOSPITAL_COMMUNITY): Payer: Self-pay

## 2022-04-15 MED ORDER — CYANOCOBALAMIN 1000 MCG/ML IJ SOLN
1000.0000 ug | INTRAMUSCULAR | 0 refills | Status: DC
  Filled 2022-04-15: qty 3, 90d supply, fill #0

## 2022-04-15 MED ORDER — WEGOVY 1 MG/0.5ML ~~LOC~~ SOAJ
1.0000 mg | SUBCUTANEOUS | 1 refills | Status: DC
  Filled 2022-04-15: qty 6, 84d supply, fill #0
  Filled 2022-05-04: qty 2, 28d supply, fill #0

## 2022-04-15 MED ORDER — "TUBERCULIN SYRINGE 25G X 5/8"" 1 ML MISC"
0 refills | Status: DC
  Filled 2022-04-15: qty 3, 90d supply, fill #0

## 2022-04-15 MED ORDER — VITAMIN D3 1.25 MG (50000 UT) PO CAPS
50000.0000 [IU] | ORAL_CAPSULE | ORAL | 0 refills | Status: DC
  Filled 2022-04-15: qty 12, 84d supply, fill #0

## 2022-04-16 ENCOUNTER — Other Ambulatory Visit (HOSPITAL_COMMUNITY): Payer: Self-pay

## 2022-04-20 ENCOUNTER — Other Ambulatory Visit (HOSPITAL_COMMUNITY): Payer: Self-pay

## 2022-04-20 DIAGNOSIS — F5102 Adjustment insomnia: Secondary | ICD-10-CM | POA: Diagnosis not present

## 2022-04-20 DIAGNOSIS — E7800 Pure hypercholesterolemia, unspecified: Secondary | ICD-10-CM | POA: Diagnosis not present

## 2022-04-20 DIAGNOSIS — Z0000 Encounter for general adult medical examination without abnormal findings: Secondary | ICD-10-CM | POA: Diagnosis not present

## 2022-04-20 MED ORDER — LORAZEPAM 1 MG PO TABS
0.5000 mg | ORAL_TABLET | Freq: Every evening | ORAL | 5 refills | Status: DC | PRN
  Filled 2022-04-20 – 2022-07-07 (×2): qty 45, 45d supply, fill #0
  Filled 2022-09-06: qty 45, 45d supply, fill #1

## 2022-04-20 MED ORDER — ROSUVASTATIN CALCIUM 10 MG PO TABS
10.0000 mg | ORAL_TABLET | Freq: Every day | ORAL | 3 refills | Status: DC
  Filled 2022-04-20 – 2022-05-04 (×2): qty 90, 90d supply, fill #0
  Filled 2022-08-02: qty 90, 90d supply, fill #1

## 2022-04-23 ENCOUNTER — Telehealth: Payer: 59 | Admitting: Family Medicine

## 2022-04-23 ENCOUNTER — Other Ambulatory Visit (HOSPITAL_COMMUNITY): Payer: Self-pay

## 2022-04-23 DIAGNOSIS — B9689 Other specified bacterial agents as the cause of diseases classified elsewhere: Secondary | ICD-10-CM | POA: Diagnosis not present

## 2022-04-23 DIAGNOSIS — J019 Acute sinusitis, unspecified: Secondary | ICD-10-CM

## 2022-04-23 MED ORDER — AMOXICILLIN-POT CLAVULANATE 875-125 MG PO TABS
1.0000 | ORAL_TABLET | Freq: Two times a day (BID) | ORAL | 0 refills | Status: AC
  Filled 2022-04-23: qty 14, 7d supply, fill #0

## 2022-04-23 NOTE — Progress Notes (Signed)

## 2022-05-04 ENCOUNTER — Other Ambulatory Visit (HOSPITAL_COMMUNITY): Payer: Self-pay

## 2022-05-04 ENCOUNTER — Other Ambulatory Visit: Payer: Self-pay | Admitting: Internal Medicine

## 2022-05-04 DIAGNOSIS — K51311 Ulcerative (chronic) rectosigmoiditis with rectal bleeding: Secondary | ICD-10-CM

## 2022-05-04 MED ORDER — LIALDA 1.2 G PO TBEC
4.8000 g | DELAYED_RELEASE_TABLET | Freq: Every day | ORAL | 0 refills | Status: DC
  Filled 2022-05-04: qty 360, 90d supply, fill #0

## 2022-05-05 ENCOUNTER — Other Ambulatory Visit: Payer: Self-pay

## 2022-05-05 ENCOUNTER — Other Ambulatory Visit (HOSPITAL_COMMUNITY): Payer: Self-pay

## 2022-05-05 MED ORDER — ZEPBOUND 2.5 MG/0.5ML ~~LOC~~ SOAJ
2.5000 mg | SUBCUTANEOUS | 1 refills | Status: DC
  Filled 2022-05-05 – 2022-05-18 (×5): qty 2, 28d supply, fill #0
  Filled 2022-06-10: qty 2, 28d supply, fill #1

## 2022-05-13 ENCOUNTER — Other Ambulatory Visit (HOSPITAL_COMMUNITY): Payer: Self-pay

## 2022-05-13 ENCOUNTER — Encounter: Payer: 59 | Admitting: Internal Medicine

## 2022-05-13 DIAGNOSIS — E669 Obesity, unspecified: Secondary | ICD-10-CM | POA: Diagnosis not present

## 2022-05-13 DIAGNOSIS — Z6830 Body mass index (BMI) 30.0-30.9, adult: Secondary | ICD-10-CM | POA: Diagnosis not present

## 2022-05-13 DIAGNOSIS — F418 Other specified anxiety disorders: Secondary | ICD-10-CM | POA: Diagnosis not present

## 2022-05-13 DIAGNOSIS — D519 Vitamin B12 deficiency anemia, unspecified: Secondary | ICD-10-CM | POA: Diagnosis not present

## 2022-05-13 DIAGNOSIS — R632 Polyphagia: Secondary | ICD-10-CM | POA: Diagnosis not present

## 2022-05-14 ENCOUNTER — Other Ambulatory Visit (HOSPITAL_COMMUNITY): Payer: Self-pay

## 2022-05-18 ENCOUNTER — Other Ambulatory Visit (HOSPITAL_COMMUNITY): Payer: Self-pay

## 2022-05-19 ENCOUNTER — Other Ambulatory Visit (HOSPITAL_COMMUNITY): Payer: Self-pay

## 2022-05-19 ENCOUNTER — Encounter: Payer: Self-pay | Admitting: Family Medicine

## 2022-05-19 ENCOUNTER — Ambulatory Visit (INDEPENDENT_AMBULATORY_CARE_PROVIDER_SITE_OTHER): Payer: Commercial Managed Care - PPO | Admitting: Family Medicine

## 2022-05-19 VITALS — BP 142/80 | HR 84 | Temp 98.6°F | Ht 70.5 in | Wt 225.2 lb

## 2022-05-19 DIAGNOSIS — R931 Abnormal findings on diagnostic imaging of heart and coronary circulation: Secondary | ICD-10-CM

## 2022-05-19 DIAGNOSIS — I700 Atherosclerosis of aorta: Secondary | ICD-10-CM | POA: Diagnosis not present

## 2022-05-19 DIAGNOSIS — M545 Low back pain, unspecified: Secondary | ICD-10-CM | POA: Diagnosis not present

## 2022-05-19 DIAGNOSIS — R030 Elevated blood-pressure reading, without diagnosis of hypertension: Secondary | ICD-10-CM

## 2022-05-19 DIAGNOSIS — F5102 Adjustment insomnia: Secondary | ICD-10-CM | POA: Diagnosis not present

## 2022-05-19 DIAGNOSIS — G8929 Other chronic pain: Secondary | ICD-10-CM

## 2022-05-19 DIAGNOSIS — K5130 Ulcerative (chronic) rectosigmoiditis without complications: Secondary | ICD-10-CM | POA: Diagnosis not present

## 2022-05-19 DIAGNOSIS — R7303 Prediabetes: Secondary | ICD-10-CM | POA: Diagnosis not present

## 2022-05-19 NOTE — Patient Instructions (Signed)
Welcome to [facility]! It was a pleasure meeting you today.  As discussed, Please schedule a 5 month follow up visit today.  PLEASE NOTE:  If you had any LAB tests please let us know if you have not heard back within a few days. You may see your results on MyChart before we have a chance to review them but we will give you a call once they are reviewed by [place]. If we ordered any REFERRALS today, please let us know if you have not heard from their office within the next week.  Let us know through MyChart if you are needing REFILLS, or have your pharmacy send [place] the request. You can also use MyChart to communicate with me or any office staff.  Please try these tips to maintain a healthy lifestyle:  Eat most of your calories during the day when you are active. Eliminate processed foods including packaged sweets (pies, cakes, cookies), reduce intake of potatoes, white bread, white pasta, and white rice. Look for whole grain options, oat flour or almond flour.  Each meal should contain half fruits/vegetables, one quarter protein, and one quarter carbs (no bigger than a computer mouse).  Cut down on sweet beverages. This includes juice, soda, and sweet tea. Also watch fruit intake, though this is a healthier sweet option, it still contains natural sugar! Limit to 3 servings daily.  Drink at least 1 glass of water with each meal and aim for at least 8 glasses per day  Exercise at least 150 minutes every week.

## 2022-05-19 NOTE — Progress Notes (Signed)
New Patient Office Visit  Subjective:  Patient ID: David Newman, male    DOB: 12/22/1960  Age: 62 y.o. MRN: [id number]  CC:  Chief Complaint  Patient presents with   Establish Care    Need new pcp due to insurance change      HPI David Newman presents for new pt-ins change 1  FH Prostate Ca-hasn't checked psa long time.  2.  [facility]-Dr. Elisabeth Cara 2016, cscope 2021-55yr.  Lialda working well 3.  [facility]-just changed to zepbound.  [facility]. Getting labs soon. Will restart exercise. Wellbutrin for cravings 4.  CAD-elevated calcium score.  Fh CAD. stable. No cp/sob.   5.  Back/knees-HEP.  6.  Elevated bp-doesn't want meds.  7.  Insomnia-taking trazodone and ativan for sleep and melatonin.  Going to EAP counseling. No SI.   8.  Annual done in Nov.    Past Medical History:  Diagnosis Date   Allergy Child   Sulfa Drugs   Anal fissure    Back pain    CAD (coronary artery disease)    Colon polyps    hyperplastic   DDD (degenerative disc disease), lumbar    GERD (gastroesophageal reflux disease)    Gout    High blood pressure    Hyperlipidemia    Joint pain    Knee pain    OA (osteoarthritis) of knee    Prediabetes    Right knee meniscal tear    Sleep apnea    Wears CPAP.   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[place]    Wears contact lenses    Wears glasses     Past Surgical History:  Procedure Laterality Date   COLONOSCOPY  04/24/2012   FACIAL RECONSTRUCTION SURGERY  1973  age 62  kicked in face by horse   KNEE ARTHROSCOPY Right 03/29/2014   Procedure: RIGHT ARTHROSCOPY KNEE WITH DEBRIDMENT PARTIAL medial lateral MENISCECTOMY AND CHONDROPLASTY;  Surgeon: RSydnee Cabal MD;  [place] [facility]  Service: Orthopedics;  Laterality: Right;   KNEE ARTHROSCOPY WITH ANTERIOR CRUCIATE LIGAMENT (ACL) REPAIR Right 05/17/1992   KNEE ARTHROSCOPY WITH MEDIAL MENISECTOMY Left 12/28/2018   Procedure:  KNEE ARTHROSCOPY WITH partial MEDIAL MENISECTOMY, debridement chondroplasty;  Surgeon: CSydnee Cabal MD;  [facility]  Service: Orthopedics;  Laterality: Left;  with knee block   WISDOM TOOTH EXTRACTION  age 62   Family History  Problem Relation Age of Onset   Alzheimer's disease Mother    Heart disease Father 667-- 70  Skin cancer Father    Hypertension Father    Hyperlipidemia Father    Stroke Father    Arthritis Father    Cancer Father    Colon polyps Brother 558  Heart disease Brother 520  Colon cancer Paternal Uncle 532  Colon cancer Cousin 547  Stomach cancer Neg Hx     Social History   Socioeconomic History   Marital status: Divorced    Spouse name: Not on file   Number of children: 2   Years of education: Not on file   Highest education level: Not on file  Occupational History   Occupation: IT    Comment: [employer]  Tobacco Use   Smoking status: Former    Packs/day: 1.00    Years: 25.00    Total pack years: 25.00    Types: Cigarettes    Quit date: 04/04/2007    Years  since quitting: 15.1   Smokeless tobacco: Never  Substance and Sexual Activity   Alcohol use: Yes    Alcohol/week: 2.0 standard drinks of alcohol    Types: 2 Cans of beer per week    Comment: OCCASIONAL   Drug use: No   Sexual activity: Yes  Other Topics Concern   Not on file  Social History Narrative   2 grands   Social Determinants of Health   Financial Resource Strain: Not on file  Food Insecurity: Not on file  Transportation Needs: Not on file  Physical Activity: Not on file  Stress: Not on file  Social Connections: Not on file  Intimate Partner Violence: Not on file    ROS  ROS: Gen: no fever, chills  Skin: no rash, itching ENT: no ear pain, ear drainage, nasal congestion, rhinorrhea, sinus pressure, sore throat Eyes: no blurry vision, double vision Resp: no cough, wheeze,SOB CV: no CP, palpitations, LE edema,  GI: no heartburn, n/v/d/c,  abd pain GU: no dysuria, urgency, frequency, hematuria.  Some dark urine w/Wegovy.  Drinks 32-64 oz.  MSK: some lbp,knees Neuro: no dizziness, headache, weakness, vertigo Psych: HPI  Objective:   Today's Vitals: BP (!) 142/80 (BP Location: Left Arm, Patient Position: Sitting, Cuff Size: Large)   Pulse 84   Temp 98.6 F (37 C) (Temporal)   Ht 5' 10.5" (1.791 m)   Wt 225 lb 4 oz (102.2 kg)   SpO2 97%   BMI 31.86 kg/m   Physical Exam  Gen: WDWN NAD WM HEENT: NCAT, conjunctiva not injected, sclera nonicteric  NECK:  supple, no thyromegaly, no nodes, no carotid bruits CARDIAC: RRR, S1S2+, no murmur. DP 2+B LUNGS: CTAB. No wheezes ABDOMEN:  BS+, soft, NTND, No HSM, no masses EXT:  no edema MSK: no gross abnormalities.  NEURO: A&O x3.  CN II-XII intact.  PSYCH: normal mood. Good eye contact   Assessment & Plan:   Problem List Items Addressed This Visit       Cardiovascular and Mediastinum   Aortic atherosclerosis ([place]), Cardiac CT 02/27/20 - Primary     Digestive   Ulcerative colitis ([place])     Other   Prediabetes   Low back pain   Relevant Medications   predniSONE (DELTASONE) 20 MG tablet   Adjustment insomnia   Abnormal screening cardiac CT, 02/27/20, score 336, 89th%   Other Visit Diagnoses     Elevated blood pressure reading         1.  Coronary artery disease found on elevated CT score, aortic atherosclerosis-chronic.  Well-controlled.  Continue rosuvastatin 10 mg.  He will be getting labs from healthy weight and wellness.  Advised to send [place] a copy. 2.  Elevated blood pressure-chronic.  Patient does not want to take medications.  He is working on diet/exercise.  Continue to monitor.  Aware of risks 3.  Ulcerative colitis-chronic.  Well-controlled on medications.  Managed by GI. 4.  Chronic low back pain/knee pain.  Able to remain active.  Continue lifestyle changes. 5.  Prediabetes-patient does on Zepbound.  Working on diet/exercise.  Managed by healthy weight  and wellness.  He will be getting labs soon 6.  Chronic insomnia-fair control on melatonin 3 mg, trazodone 50 mg, lorazepam 1 mg.  He has enough refills for the next 5 months or so.  Follow-up in 5 months for refills.  Outpatient Encounter Medications as of 05/19/2022  Medication Sig   aspirin 81 MG chewable tablet Chew 81 mg by mouth.  betamethasone dipropionate 0.05 % lotion Apply 1 application (a small amount) topically to the affected area on the scalp once a day   buPROPion ER (WELLBUTRIN SR) 100 MG 12 hr tablet Take 1 tablet (100 mg total) by mouth daily.   calcium carbonate (OS-CAL) 1250 (500 Ca) MG chewable tablet Chew by mouth.    Cholecalciferol (VITAMIN D3) 1.25 MG (50000 UT) CAPS Take 1 capsule by mouth once a week on Saturdays   COVID-19 At Home Antigen Test (CARESTART COVID-19 HOME TEST) KIT Use as directed   COVID-19 mRNA bivalent vaccine, Pfizer, (PFIZER COVID-19 VAC BIVALENT) injection Inject into the muscle.   cyanocobalamin (,VITAMIN B-12,) 1000 MCG/ML injection Inject 1 mL (1,000 mcg total) into the skin once a week for 4 weeks then 1 mL every 30 (thirty) days.   Insulin Syringe-Needle U-100 (B-D INS SYR MICROFINE 1CC/27G) 27G X 5/8" 1 ML MISC Use to administer b12 injection once a week   LIALDA 1.2 g EC tablet Take 4 tablets (4.8 g total) by mouth daily with breakfast.   LORazepam (ATIVAN) 1 MG tablet Take 0.5-1.5 tablets (0.5-1.5 mg total) by mouth at bedtime as needed.   LORazepam (ATIVAN) 1 MG tablet Take 0.5-1.5 tablets (0.5-1.5 mg total) by mouth at bedtime as needed.   LORazepam (ATIVAN) 1 MG tablet Take 1/2-1 tablet (0.5-1 mg total) by mouth at bedtime as needed.   melatonin 3 MG TABS tablet Take by mouth.   mesalamine (ROWASA) 4 g enema INSERT 60 MLS RECTALLY TWICE DAILY. HOLD FOR 30 MINUTES IF POSSIBLE.   metroNIDAZOLE (METROGEL) 1 % gel Apply 1 application ( a small amount)  topically  to face daily.   Naftifine HCl (NAFTIN) 2 % GEL Apply 1 application (a small  amount) topically to the affected area on feet once daily   predniSONE (DELTASONE) 20 MG tablet as needed.   rosuvastatin (CRESTOR) 10 MG tablet Take 1 tablet (10 mg total) by mouth daily.   Syringe/Needle, Disp, (SYRINGE 3CC/25GX1") 25G X 1" 3 ML MISC For B12 injections.   tirzepatide (ZEPBOUND) 2.5 MG/0.5ML Pen Inject 2.5 mg into the skin once a week.   traZODone (DESYREL) 50 MG tablet Take 1 tablet (50 mg total) by mouth at bedtime as needed.   Insulin Syringe-Needle U-100 27G X 5/8" 1 ML MISC use to administer b-12 injection into the skin once weekly   Vitamin D, Ergocalciferol, (DRISDOL) 1.25 MG (50000 UNIT) CAPS capsule Take 1 capsule (50,000 Units total) by mouth every 7 (seven) days.   [DISCONTINUED] Cholecalciferol (VITAMIN D3) 1.25 MG (50000 UT) CAPS Take 1 capsule by mouth once a week on Saturday   [DISCONTINUED] Cholecalciferol (VITAMIN D3) 1.25 MG (50000 UT) CAPS Take 1 capsule  (50,000 Units) by mouth once a week on Saturday   [DISCONTINUED] cyanocobalamin (VITAMIN B12) 1000 MCG/ML injection Inject 1 mL (1,000 mcg total) into the muscle once a week.   [DISCONTINUED] cyanocobalamin (VITAMIN B12) 1000 MCG/ML injection Inject 1 mL (1,000 mcg total) into the muscle every 30 (thirty) days.   [DISCONTINUED] cyclobenzaprine (FLEXERIL) 5 MG tablet Take 1 tablet (5 mg total) by mouth 3 (three) times daily as needed for muscle spasms.   [DISCONTINUED] cyclobenzaprine (FLEXERIL) 5 MG tablet Take 1 tablet (5 mg total) by mouth at bedtime as needed.   [DISCONTINUED] gabapentin (NEURONTIN) 300 MG capsule Take 1 capsule (300 mg total) by mouth 3 (three) times daily as needed.   [DISCONTINUED] Semaglutide-Weight Management (WEGOVY) 1 MG/0.5ML SOAJ Inject 1 mg into the skin  once a week.   [DISCONTINUED] Semaglutide-Weight Management (WEGOVY) 1 MG/0.5ML SOAJ Inject 1 mg into the skin once a week.   [DISCONTINUED] Semaglutide-Weight Management (WEGOVY) 1.7 MG/0.75ML SOAJ Inject 1.7 mg into the skin once  a week.   [DISCONTINUED] tirzepatide [facility]) 5 MG/0.5ML Pen Inject 5 mg into the skin once a week.   [DISCONTINUED] tirzepatide (MOUNJARO) 7.5 MG/0.5ML Pen Inject 7.5 mg into the skin once a week.   [DISCONTINUED] TUBERCULIN SYR 1CC/25GX5/8" 25G X 5/8" 1 ML MISC Use to administer b12 injection once per month   No facility-administered encounter medications on file as of 05/19/2022.    Follow-up: Return in about 5 months (around 10/18/2022) for meds-insomnia.   Wellington Hampshire, MD

## 2022-05-21 ENCOUNTER — Other Ambulatory Visit (HOSPITAL_COMMUNITY): Payer: Self-pay

## 2022-05-24 ENCOUNTER — Other Ambulatory Visit (INDEPENDENT_AMBULATORY_CARE_PROVIDER_SITE_OTHER): Payer: Self-pay

## 2022-05-25 ENCOUNTER — Other Ambulatory Visit (HOSPITAL_COMMUNITY): Payer: Self-pay

## 2022-05-25 MED ORDER — VITAMIN D3 1.25 MG (50000 UT) PO CAPS
1.0000 | ORAL_CAPSULE | ORAL | 0 refills | Status: DC
  Filled 2022-05-25 – 2023-02-11 (×3): qty 12, 84d supply, fill #0

## 2022-06-07 ENCOUNTER — Other Ambulatory Visit (HOSPITAL_COMMUNITY): Payer: Self-pay

## 2022-07-07 ENCOUNTER — Other Ambulatory Visit (HOSPITAL_COMMUNITY): Payer: Self-pay

## 2022-07-08 ENCOUNTER — Other Ambulatory Visit: Payer: Self-pay

## 2022-07-08 ENCOUNTER — Other Ambulatory Visit (HOSPITAL_COMMUNITY): Payer: Self-pay

## 2022-07-08 MED ORDER — CYCLOBENZAPRINE HCL 5 MG PO TABS
5.0000 mg | ORAL_TABLET | Freq: Every day | ORAL | 0 refills | Status: DC
  Filled 2022-07-08: qty 30, 30d supply, fill #0

## 2022-07-08 MED ORDER — NAFTIFINE HCL 2 % EX GEL
1.0000 "application " | Freq: Every day | CUTANEOUS | 0 refills | Status: DC
  Filled 2022-07-08 – 2022-08-03 (×4): qty 45, 30d supply, fill #0

## 2022-07-12 ENCOUNTER — Other Ambulatory Visit (HOSPITAL_COMMUNITY): Payer: Self-pay

## 2022-07-12 DIAGNOSIS — Z6830 Body mass index (BMI) 30.0-30.9, adult: Secondary | ICD-10-CM | POA: Diagnosis not present

## 2022-07-12 DIAGNOSIS — E538 Deficiency of other specified B group vitamins: Secondary | ICD-10-CM | POA: Diagnosis not present

## 2022-07-12 DIAGNOSIS — R7303 Prediabetes: Secondary | ICD-10-CM | POA: Diagnosis not present

## 2022-07-12 DIAGNOSIS — E669 Obesity, unspecified: Secondary | ICD-10-CM | POA: Diagnosis not present

## 2022-07-12 DIAGNOSIS — E782 Mixed hyperlipidemia: Secondary | ICD-10-CM | POA: Diagnosis not present

## 2022-07-12 MED ORDER — ZEPBOUND 5 MG/0.5ML ~~LOC~~ SOAJ
5.0000 mg | SUBCUTANEOUS | 6 refills | Status: DC
  Filled 2022-07-12: qty 2, 28d supply, fill #0
  Filled 2022-08-10 – 2022-08-12 (×3): qty 2, 28d supply, fill #1

## 2022-07-13 DIAGNOSIS — R7303 Prediabetes: Secondary | ICD-10-CM | POA: Diagnosis not present

## 2022-07-13 DIAGNOSIS — E538 Deficiency of other specified B group vitamins: Secondary | ICD-10-CM | POA: Diagnosis not present

## 2022-07-13 DIAGNOSIS — E782 Mixed hyperlipidemia: Secondary | ICD-10-CM | POA: Diagnosis not present

## 2022-07-19 ENCOUNTER — Other Ambulatory Visit (HOSPITAL_COMMUNITY): Payer: Self-pay

## 2022-07-19 MED ORDER — ZEPBOUND 15 MG/0.5ML ~~LOC~~ SOAJ
15.0000 mg | SUBCUTANEOUS | 0 refills | Status: DC
  Filled 2022-07-19: qty 2, 28d supply, fill #0

## 2022-07-21 ENCOUNTER — Other Ambulatory Visit (HOSPITAL_COMMUNITY): Payer: Self-pay

## 2022-07-21 MED ORDER — ZEPBOUND 15 MG/0.5ML ~~LOC~~ SOAJ
15.0000 mg | SUBCUTANEOUS | 0 refills | Status: DC
  Filled 2022-07-21: qty 6, 84d supply, fill #0
  Filled 2022-09-06: qty 2, 28d supply, fill #0
  Filled 2023-03-29: qty 2, 28d supply, fill #1

## 2022-07-21 MED ORDER — ZEPBOUND 7.5 MG/0.5ML ~~LOC~~ SOAJ
7.5000 mg | SUBCUTANEOUS | 0 refills | Status: DC
  Filled 2022-07-21: qty 2, 30d supply, fill #0
  Filled 2022-08-02: qty 2, 28d supply, fill #0

## 2022-08-02 ENCOUNTER — Other Ambulatory Visit (HOSPITAL_COMMUNITY): Payer: Self-pay

## 2022-08-02 ENCOUNTER — Other Ambulatory Visit: Payer: Self-pay | Admitting: Internal Medicine

## 2022-08-02 ENCOUNTER — Other Ambulatory Visit: Payer: Self-pay

## 2022-08-02 DIAGNOSIS — K51311 Ulcerative (chronic) rectosigmoiditis with rectal bleeding: Secondary | ICD-10-CM

## 2022-08-02 MED ORDER — MESALAMINE 1.2 G PO TBEC
4.8000 g | DELAYED_RELEASE_TABLET | Freq: Every day | ORAL | 0 refills | Status: DC
  Filled 2022-08-02: qty 360, 90d supply, fill #0
  Filled 2022-08-04: qty 240, 60d supply, fill #0
  Filled 2022-10-12: qty 120, 30d supply, fill #1

## 2022-08-03 ENCOUNTER — Other Ambulatory Visit (HOSPITAL_COMMUNITY): Payer: Self-pay

## 2022-08-04 ENCOUNTER — Other Ambulatory Visit (HOSPITAL_COMMUNITY): Payer: Self-pay

## 2022-08-04 MED ORDER — KETOCONAZOLE 2 % EX CREA
TOPICAL_CREAM | Freq: Two times a day (BID) | CUTANEOUS | 2 refills | Status: AC
  Filled 2022-08-04: qty 60, 14d supply, fill #0

## 2022-08-11 ENCOUNTER — Other Ambulatory Visit (HOSPITAL_COMMUNITY): Payer: Self-pay

## 2022-08-11 DIAGNOSIS — Z6830 Body mass index (BMI) 30.0-30.9, adult: Secondary | ICD-10-CM | POA: Diagnosis not present

## 2022-08-11 DIAGNOSIS — E669 Obesity, unspecified: Secondary | ICD-10-CM | POA: Diagnosis not present

## 2022-08-11 DIAGNOSIS — F439 Reaction to severe stress, unspecified: Secondary | ICD-10-CM | POA: Diagnosis not present

## 2022-08-11 DIAGNOSIS — E65 Localized adiposity: Secondary | ICD-10-CM | POA: Diagnosis not present

## 2022-08-11 DIAGNOSIS — R632 Polyphagia: Secondary | ICD-10-CM | POA: Diagnosis not present

## 2022-08-11 MED ORDER — ZEPBOUND 10 MG/0.5ML ~~LOC~~ SOAJ
10.0000 mg | SUBCUTANEOUS | 0 refills | Status: DC
  Filled 2022-08-11 – 2022-11-05 (×2): qty 2, 28d supply, fill #0

## 2022-08-11 MED ORDER — ZEPBOUND 5 MG/0.5ML ~~LOC~~ SOAJ
5.0000 mg | SUBCUTANEOUS | 6 refills | Status: DC
  Filled 2022-08-11 (×2): qty 2, 28d supply, fill #0

## 2022-08-11 MED ORDER — ZEPBOUND 7.5 MG/0.5ML ~~LOC~~ SOAJ
7.5000 mg | SUBCUTANEOUS | 0 refills | Status: DC
  Filled 2022-08-11 – 2022-08-12 (×2): qty 2, 28d supply, fill #0

## 2022-08-12 ENCOUNTER — Other Ambulatory Visit (HOSPITAL_COMMUNITY): Payer: Self-pay

## 2022-09-06 ENCOUNTER — Other Ambulatory Visit (INDEPENDENT_AMBULATORY_CARE_PROVIDER_SITE_OTHER): Payer: Self-pay

## 2022-09-06 ENCOUNTER — Other Ambulatory Visit (HOSPITAL_COMMUNITY): Payer: Self-pay

## 2022-09-06 ENCOUNTER — Other Ambulatory Visit: Payer: Self-pay

## 2022-09-07 ENCOUNTER — Other Ambulatory Visit (HOSPITAL_COMMUNITY): Payer: Self-pay

## 2022-09-07 MED ORDER — BD PLASTIPAK SYRINGE 3 ML MISC: 0 refills | Status: DC

## 2022-09-07 MED ORDER — CYANOCOBALAMIN 1000 MCG/ML IJ SOLN
1000.0000 ug | INTRAMUSCULAR | 0 refills | Status: DC
  Filled 2022-09-07: qty 3, 90d supply, fill #0
  Filled 2023-01-30 – 2023-02-11 (×2): qty 3, 90d supply, fill #1
  Filled 2023-07-06: qty 3, 90d supply, fill #2

## 2022-09-13 ENCOUNTER — Other Ambulatory Visit: Payer: Self-pay

## 2022-09-21 ENCOUNTER — Other Ambulatory Visit (HOSPITAL_COMMUNITY): Payer: Self-pay

## 2022-09-22 ENCOUNTER — Other Ambulatory Visit (HOSPITAL_COMMUNITY): Payer: Self-pay

## 2022-09-22 DIAGNOSIS — E559 Vitamin D deficiency, unspecified: Secondary | ICD-10-CM | POA: Diagnosis not present

## 2022-09-22 DIAGNOSIS — F439 Reaction to severe stress, unspecified: Secondary | ICD-10-CM | POA: Diagnosis not present

## 2022-09-22 DIAGNOSIS — D519 Vitamin B12 deficiency anemia, unspecified: Secondary | ICD-10-CM | POA: Diagnosis not present

## 2022-09-22 DIAGNOSIS — E669 Obesity, unspecified: Secondary | ICD-10-CM | POA: Diagnosis not present

## 2022-09-22 DIAGNOSIS — E782 Mixed hyperlipidemia: Secondary | ICD-10-CM | POA: Diagnosis not present

## 2022-09-22 DIAGNOSIS — M542 Cervicalgia: Secondary | ICD-10-CM | POA: Diagnosis not present

## 2022-09-22 MED ORDER — TRAZODONE HCL 50 MG PO TABS
50.0000 mg | ORAL_TABLET | Freq: Every day | ORAL | 1 refills | Status: DC
  Filled 2022-09-22 – 2022-09-30 (×2): qty 90, 90d supply, fill #0
  Filled 2022-12-23: qty 90, 90d supply, fill #1

## 2022-09-22 MED ORDER — CYANOCOBALAMIN 1000 MCG/ML IJ SOLN
1000.0000 ug | INTRAMUSCULAR | 1 refills | Status: DC
  Filled 2022-09-22: qty 3, 90d supply, fill #0

## 2022-09-22 MED ORDER — VITAMIN D3 1.25 MG (50000 UT) PO CAPS
50000.0000 [IU] | ORAL_CAPSULE | ORAL | 3 refills | Status: DC
  Filled 2022-09-22: qty 6, 84d supply, fill #0
  Filled 2023-02-11 – 2023-05-06 (×3): qty 6, 84d supply, fill #1
  Filled 2023-07-06 – 2023-08-14 (×2): qty 6, 84d supply, fill #2

## 2022-09-22 MED ORDER — ROSUVASTATIN CALCIUM 10 MG PO TABS
10.0000 mg | ORAL_TABLET | Freq: Every day | ORAL | 1 refills | Status: DC
  Filled 2022-09-22 – 2022-11-05 (×2): qty 90, 90d supply, fill #0

## 2022-09-22 MED ORDER — "BD TB SYRINGE 27G X 1/2"" 1 ML MISC"
1 refills | Status: DC
  Filled 2022-09-22 – 2023-07-06 (×3): qty 12, 84d supply, fill #0

## 2022-09-22 MED ORDER — BUPROPION HCL ER (SR) 100 MG PO TB12
100.0000 mg | ORAL_TABLET | Freq: Every day | ORAL | 1 refills | Status: DC
  Filled 2022-09-22 – 2022-10-13 (×3): qty 90, 90d supply, fill #0
  Filled 2023-02-11: qty 90, 90d supply, fill #1

## 2022-09-22 MED ORDER — CYCLOBENZAPRINE HCL 5 MG PO TABS
5.0000 mg | ORAL_TABLET | Freq: Every evening | ORAL | 0 refills | Status: DC | PRN
  Filled 2022-09-22: qty 90, 90d supply, fill #0

## 2022-09-30 ENCOUNTER — Other Ambulatory Visit (HOSPITAL_COMMUNITY): Payer: Self-pay

## 2022-09-30 LAB — HM DIABETES EYE EXAM

## 2022-10-12 ENCOUNTER — Other Ambulatory Visit (HOSPITAL_COMMUNITY): Payer: Self-pay

## 2022-10-12 ENCOUNTER — Telehealth: Payer: Self-pay | Admitting: Internal Medicine

## 2022-10-12 ENCOUNTER — Other Ambulatory Visit: Payer: Self-pay

## 2022-10-12 DIAGNOSIS — K51311 Ulcerative (chronic) rectosigmoiditis with rectal bleeding: Secondary | ICD-10-CM

## 2022-10-12 MED ORDER — MESALAMINE 1.2 G PO TBEC
4.8000 g | DELAYED_RELEASE_TABLET | Freq: Every day | ORAL | 0 refills | Status: DC
Start: 2022-10-12 — End: 2022-11-02
  Filled 2022-10-13: qty 360, 90d supply, fill #0

## 2022-10-12 NOTE — Telephone Encounter (Signed)
Inbound call from patient requesting a refill for LIALDA. He is schedule for a follow up on 6/18.

## 2022-10-12 NOTE — Telephone Encounter (Signed)
Lialda refilled 

## 2022-10-13 ENCOUNTER — Other Ambulatory Visit (HOSPITAL_COMMUNITY): Payer: Self-pay

## 2022-10-18 ENCOUNTER — Ambulatory Visit: Payer: Self-pay | Admitting: Family Medicine

## 2022-11-02 ENCOUNTER — Encounter: Payer: Self-pay | Admitting: Internal Medicine

## 2022-11-02 ENCOUNTER — Other Ambulatory Visit (HOSPITAL_COMMUNITY): Payer: Self-pay

## 2022-11-02 ENCOUNTER — Ambulatory Visit: Payer: Managed Care, Other (non HMO) | Admitting: Internal Medicine

## 2022-11-02 VITALS — BP 152/80 | HR 95 | Ht 71.0 in | Wt 231.0 lb

## 2022-11-02 DIAGNOSIS — K51311 Ulcerative (chronic) rectosigmoiditis with rectal bleeding: Secondary | ICD-10-CM

## 2022-11-02 DIAGNOSIS — K5901 Slow transit constipation: Secondary | ICD-10-CM

## 2022-11-02 MED ORDER — MESALAMINE 1.2 G PO TBEC
4.8000 g | DELAYED_RELEASE_TABLET | Freq: Every day | ORAL | 3 refills | Status: DC
Start: 2022-11-02 — End: 2022-11-10
  Filled 2022-11-02: qty 360, 90d supply, fill #0

## 2022-11-02 MED ORDER — PREDNISONE 20 MG PO TABS
20.0000 mg | ORAL_TABLET | ORAL | 3 refills | Status: DC | PRN
  Filled 2022-11-02: qty 30, 30d supply, fill #0

## 2022-11-02 NOTE — Progress Notes (Signed)
HISTORY OF PRESENT ILLNESS:  David Newman is a 62 y.o. 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  He was last seen in the office June 23, 2020.  At that time he was in clinical remission.  He was continued on Lialda 4.8 g daily. His colonoscopy was performed April 17, 2000, at which time he was having mild flare of disease.  He was found to have mild active proctitis at the distal centimeters per rectum.  As well, inflammatory cecal patch.  Biopsies from each area were consistent with active chronic inflammatory bowel disease.  Biopsies from normal-appearing portions of the colon were normal.    He presents today for routine follow-up.  Request medication refill.  He tells me that he is doing well.  He continues on Lialda 4.8 g daily.  Generic formulation over the past 3 months.  He generally has 1 formed bowel movement per day.  Occasional minor bleeding which she feels may be secondary to a small fissure.  No abdominal pain.  Does take Metamucil.  MiraLAX for occasional constipation.  No new GI complaints.  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  Does request having limited amount of prednisone on hand should he have a flare.  He tells me that he had extensive blood work as part of his routine evaluation with his PCP in February.  No abnormalities to report.  REVIEW OF SYSTEMS:  All non-GI ROS negative unless otherwise stated in the HPI except for sleeping problems  Past Medical History:  Diagnosis Date   Allergy Child   Sulfa Drugs   Anal fissure    Back pain    CAD (coronary artery disease)    Colon polyps    hyperplastic   DDD (degenerative disc disease), lumbar    GERD (gastroesophageal reflux disease)    Gout    High blood pressure    Hyperlipidemia    Joint pain    Knee pain    OA (osteoarthritis) of knee    Prediabetes    Right knee meniscal tear    Sleep  apnea    Wears CPAP.   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[facility])    Wears contact lenses    Wears glasses     Past Surgical History:  Procedure Laterality Date   COLONOSCOPY  04/24/2012   FACIAL RECONSTRUCTION SURGERY  1973  age 68   kicked in face by horse   KNEE ARTHROSCOPY Right 03/29/2014   Procedure: RIGHT ARTHROSCOPY KNEE WITH DEBRIDMENT PARTIAL medial lateral MENISCECTOMY AND CHONDROPLASTY;  Surgeon: Eugenia Mcalpine, MD;  Location: [facility];  Service: Orthopedics;  Laterality: Right;   KNEE ARTHROSCOPY WITH ANTERIOR CRUCIATE LIGAMENT (ACL) REPAIR Right 05/17/1992   KNEE ARTHROSCOPY WITH MEDIAL MENISECTOMY Left 12/28/2018   Procedure: KNEE ARTHROSCOPY WITH partial MEDIAL MENISECTOMY, debridement chondroplasty;  Surgeon: Eugenia Mcalpine, MD;  [facility];  Service: Orthopedics;  Laterality: Left;  with knee block   WISDOM TOOTH EXTRACTION  age 89    Social History David Newman  reports that he quit smoking about 15 years ago. His smoking use included cigarettes. He has a 25.00 pack-year smoking history. He has never used smokeless tobacco. He reports current alcohol use of about 2.0 standard drinks of alcohol per week. He reports that he does not use drugs.  family history includes Alzheimer's disease in his mother; Arthritis in his father;  Cancer in his father; Colon cancer (age of onset: 67) in his cousin; Colon cancer (age of onset: 68) in his paternal uncle; Colon polyps (age of onset: 30) in his brother; Heart disease (age of onset: 12) in his brother; Heart disease (age of onset: 77 - 64) in his father; Hyperlipidemia in his father; Hypertension in his father; Skin cancer in his father; Stroke in his father.  Allergies  Allergen Reactions   Sulfa Antibiotics Nausea And Vomiting    Other Reaction(s): abdominal pain   Other Other (See Comments)    glucagon-like peptide 1-constipation       PHYSICAL EXAMINATION: Vital signs: BP (!)  152/80   Pulse 95   Ht 5\' 11"  (1.803 m)   Wt 231 lb (104.8 kg)   BMI 32.22 kg/m   Constitutional: generally well-appearing, no acute distress Psychiatric: alert and oriented x3, cooperative Eyes: extraocular movements intact, anicteric, conjunctiva pink Mouth: oral pharynx moist, no lesions Neck: supple no lymphadenopathy Cardiovascular: heart regular rate and rhythm, no murmur Lungs: clear to auscultation bilaterally Abdomen: soft, nontender, nondistended, no obvious ascites, no peritoneal signs, normal bowel sounds, no organomegaly Rectal: Omitted Extremities: no lower extremity edema bilaterally Skin: no lesions on visible extremities Neuro: No focal deficits.  Cranial nerves intact  ASSESSMENT:  1.  Chronic ulcerative proctosigmoiditis.  Most recent colonoscopy with proctitis and cecal patch as described.  Otherwise unremarkable exam.  Currently asymptomatic on Lialda 4.8 g daily. 2.  Mild constipation managed with regular fiber and MiraLAX on demand   PLAN:  1.  Continue Lialda 4.8 g daily.  Medication refilled.  Medication risks reviewed. 2.  Rowasa enemas as needed 3.  Limited supply of prednisone 20 mg #30 provided.  I have instructed him to contact the office before initiating steroid therapy.  He agrees. 4.  Routine office follow-up 1 year 5.  Routine surveillance colonoscopy planned around December 2026 A total time of 30 minutes spent preparing to see the patient, reviewing available data, obtaining comprehensive interval history, performing medically appropriate physical exam, counseling and educating the patient regarding above listed issues, answering questions, prescribing medications, defining follow-up interval, and documenting clinical information in the health record

## 2022-11-02 NOTE — Patient Instructions (Signed)
We have sent the following medications to your pharmacy for you to pick up at your convenience:  Lialda, Prednisone  Please follow up in one year.  _______________________________________________________  If your blood pressure at your visit was 140/90 or greater, please contact your primary care physician to follow up on this.  _______________________________________________________  If you are age 62 or older, your body mass index should be between 23-30. Your Body mass index is 32.22 kg/m. If this is out of the aforementioned range listed, please consider follow up with your Primary Care Provider.  If you are age 55 or younger, your body mass index should be between 19-25. Your Body mass index is 32.22 kg/m. If this is out of the aformentioned range listed, please consider follow up with your Primary Care Provider.   ________________________________________________________  The [facility] providers would like to encourage you to use [facility] to communicate with providers for non-urgent requests or questions.  Due to long hold times on the telephone, sending your provider a message by [facility] may be a faster and more efficient way to get a response.  Please allow 48 business hours for a response.  Please remember that this is for non-urgent requests.  _______________________________________________________

## 2022-11-05 ENCOUNTER — Other Ambulatory Visit (HOSPITAL_COMMUNITY): Payer: Self-pay

## 2022-11-10 ENCOUNTER — Encounter: Payer: Self-pay | Admitting: Family Medicine

## 2022-11-10 ENCOUNTER — Other Ambulatory Visit (HOSPITAL_COMMUNITY): Payer: Self-pay

## 2022-11-10 ENCOUNTER — Ambulatory Visit: Payer: Managed Care, Other (non HMO) | Admitting: Family Medicine

## 2022-11-10 VITALS — BP 130/70 | HR 92 | Temp 98.3°F | Resp 18 | Ht 71.0 in | Wt 230.2 lb

## 2022-11-10 DIAGNOSIS — R931 Abnormal findings on diagnostic imaging of heart and coronary circulation: Secondary | ICD-10-CM | POA: Diagnosis not present

## 2022-11-10 DIAGNOSIS — K5100 Ulcerative (chronic) pancolitis without complications: Secondary | ICD-10-CM | POA: Diagnosis not present

## 2022-11-10 DIAGNOSIS — R7303 Prediabetes: Secondary | ICD-10-CM

## 2022-11-10 DIAGNOSIS — F5102 Adjustment insomnia: Secondary | ICD-10-CM

## 2022-11-10 MED ORDER — ROSUVASTATIN CALCIUM 10 MG PO TABS
10.0000 mg | ORAL_TABLET | Freq: Every day | ORAL | 1 refills | Status: DC
  Filled 2022-11-11: qty 90, 90d supply, fill #0
  Filled 2023-02-11: qty 90, 90d supply, fill #1

## 2022-11-10 MED ORDER — LORAZEPAM 1 MG PO TABS
0.5000 mg | ORAL_TABLET | Freq: Every evening | ORAL | 5 refills | Status: DC | PRN
  Filled 2022-11-10: qty 45, 30d supply, fill #0
  Filled 2022-12-20: qty 45, 30d supply, fill #1
  Filled 2023-01-30: qty 45, 30d supply, fill #2
  Filled 2023-03-29: qty 45, 30d supply, fill #3
  Filled 2023-05-06: qty 45, 30d supply, fill #4

## 2022-11-10 NOTE — Progress Notes (Signed)
Subjective:     Patient ID: David Newman, male    DOB: 05-01-1961, 62 y.o.   MRN: [id number]  Chief Complaint  Patient presents with   Medical Management of Chronic Issues    5 month follow-up on medication for insomnia, no issues or concerns with medications     HPI  UC-followed by Dr. Marina Goodell.  On Lialda.  Controlled CAD-elevated calcium score w/FH CAD.  Asymptomatic.  On ASA and rosuvastatin 10mg  preDM-working w/healthy wt and wellness for diet/exercise Insomnia-on trazodone and ativan-1-1.5 tabs at hs.  No SI  There are no preventive care reminders to display for this patient.  Past Medical History:  Diagnosis Date   Allergy Child   Sulfa Drugs   Anal fissure    Back pain    CAD (coronary artery disease)    Colon polyps    hyperplastic   DDD (degenerative disc disease), lumbar    GERD (gastroesophageal reflux disease)    Gout    High blood pressure    Hyperlipidemia    Joint pain    Knee pain    OA (osteoarthritis) of knee    Prediabetes    Right knee meniscal tear    Sleep apnea    Wears CPAP.   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HCC)    Wears contact lenses    Wears glasses     Past Surgical History:  Procedure Laterality Date   COLONOSCOPY  04/24/2012   FACIAL RECONSTRUCTION SURGERY  1973  age 54   kicked in face by horse   KNEE ARTHROSCOPY Right 03/29/2014   Procedure: RIGHT ARTHROSCOPY KNEE WITH DEBRIDMENT PARTIAL medial lateral MENISCECTOMY AND CHONDROPLASTY;  Surgeon: Eugenia Mcalpine, MD;  Location: [facility];  Service: Orthopedics;  Laterality: Right;   KNEE ARTHROSCOPY WITH ANTERIOR CRUCIATE LIGAMENT (ACL) REPAIR Right 05/17/1992   KNEE ARTHROSCOPY WITH MEDIAL MENISECTOMY Left 12/28/2018   Procedure: KNEE ARTHROSCOPY WITH partial MEDIAL MENISECTOMY, debridement chondroplasty;  Surgeon: Eugenia Mcalpine, MD;  Location: [facility];  Service: Orthopedics;  Laterality: Left;  with knee block   WISDOM TOOTH EXTRACTION   age 10     Current Outpatient Medications:    aspirin EC 81 MG tablet, 1 tablet Orally Once a day, Disp: , Rfl:    B Complex Vitamins (VITAMIN B COMPLEX) TABS, Take 1 tablet by mouth daily., Disp: , Rfl:    betamethasone dipropionate 0.05 % lotion, Apply 1 application (a small amount) topically to the affected area on the scalp once a day, Disp: 60 mL, Rfl: 2   buPROPion ER (WELLBUTRIN SR) 100 MG 12 hr tablet, Take 1 tablet (100 mg total) by mouth daily., Disp: 90 tablet, Rfl: 3   buPROPion ER (WELLBUTRIN SR) 100 MG 12 hr tablet, Take 1 tablet (100 mg total) by mouth daily., Disp: 90 tablet, Rfl: 1   calcium carbonate (OS-CAL) 1250 (500 Ca) MG chewable tablet, Chew by mouth., Disp: , Rfl:    Cholecalciferol (VITAMIN D3) 1.25 MG (50000 UT) CAPS, Take 1 capsule (1.25 mg total) by mouth once a week on Saturday, Disp: 12 capsule, Rfl: 0   Cholecalciferol (VITAMIN D3) 1.25 MG (50000 UT) CAPS, Take 1 capsule (50,000 Units total) by mouth every 14 (fourteen) days., Disp: 12 capsule, Rfl: 3   Cholecalciferol (VITAMIN D3) 1.25 MG (50000 UT) capsule, Take 1 capsule by mouth once a week on Saturdays, Disp: 12 capsule, Rfl: 0   cyanocobalamin (,VITAMIN B-12,) 1000 MCG/ML injection, Inject 1 mL (1,000 mcg total) into  the skin once a week for 4 weeks then 1 mL every 30 (thirty) days., Disp: 1 mL, Rfl: 15   cyanocobalamin (VITAMIN B12) 1000 MCG/ML injection, Inject 1 mL (1,000 mcg total) into the muscle every 30 (thirty) days., Disp: 12 mL, Rfl: 0   cyanocobalamin (VITAMIN B12) 1000 MCG/ML injection, Inject 1 mL (1,000 mcg total) into the muscle every month, Disp: 12 mL, Rfl: 1   cyclobenzaprine (FLEXERIL) 5 MG tablet, Take 1 tablet (5 mg total) by mouth at bedtime as needed., Disp: 30 tablet, Rfl: 0   cyclobenzaprine (FLEXERIL) 5 MG tablet, Take 1 tablet (5 mg total) by mouth at bedtime as needed., Disp: 90 tablet, Rfl: 0   Insulin Syringe-Needle U-100 (B-D INS SYR MICROFINE 1CC/27G) 27G X 5/8" 1 ML MISC, Use  to administer b12 injection once a week, Disp: 12 each, Rfl: 0   Insulin Syringe-Needle U-100 27G X 5/8" 1 ML MISC, use to administer b-12 injection into the skin once weekly, Disp: 12 each, Rfl: 0   ketoconazole (NIZORAL) 2 % cream, Apply topically to affected area 2 (two) times daily for 2 weeks for flares on feet as directed, Disp: 60 g, Rfl: 2   LORazepam (ATIVAN) 1 MG tablet, Take 1/2-1 tablet (0.5-1 mg total) by mouth at bedtime as needed., Disp: 45 tablet, Rfl: 5   melatonin 3 MG TABS tablet, Take by mouth., Disp: , Rfl:    metroNIDAZOLE (METROGEL) 1 % gel, Apply 1 application ( a small amount)  topically  to face daily., Disp: 60 g, Rfl: 5   predniSONE (DELTASONE) 20 MG tablet, Take 1 tablet (20 mg total) by mouth as needed., Disp: 30 tablet, Rfl: 3   rosuvastatin (CRESTOR) 10 MG tablet, Take 1 tablet (10 mg total) by mouth daily., Disp: 90 tablet, Rfl: 3   Syringe, Disposable, (BD PLASTIPAK SYRINGE) 3 ML MISC, Use to inject B12, Disp: 12 each, Rfl: 0   Syringe/Needle, Disp, (SYRINGE 3CC/25GX1") 25G X 1" 3 ML MISC, For B12 injections., Disp: 50 each, Rfl: 0   tirzepatide (ZEPBOUND) 10 MG/0.5ML Pen, Inject 10 mg into the skin once a week., Disp: 2 mL, Rfl: 0   tirzepatide (ZEPBOUND) 15 MG/0.5ML Pen, Inject 15 mg into the skin once a week., Disp: 6 mL, Rfl: 0   tirzepatide (ZEPBOUND) 2.5 MG/0.5ML Pen, Inject 2.5 mg into the skin once a week., Disp: 2 mL, Rfl: 1   tirzepatide (ZEPBOUND) 5 MG/0.5ML Pen, Inject 5 mg into the skin once a week., Disp: 2 mL, Rfl: 6   tirzepatide (ZEPBOUND) 7.5 MG/0.5ML Pen, Inject 7.5 mg into the skin once a week., Disp: 2 mL, Rfl: 0   tirzepatide (ZEPBOUND) 7.5 MG/0.5ML Pen, Inject 7.5 mg into the skin once a week., Disp: 2 mL, Rfl: 0   traZODone (DESYREL) 50 MG tablet, Take 1 tablet (50 mg total) by mouth at bedtime as needed., Disp: 90 tablet, Rfl: 3   traZODone (DESYREL) 50 MG tablet, Take 1 tablet (50 mg total) by mouth at bedtime as needed, Disp: 90 tablet,  Rfl: 1   TUBERCULIN SYR 1CC/27GX1/2" (B-D TB SYRINGE 1CC/27GX1/2") 27G X 1/2" 1 ML MISC, Use to inject vitamin b-12 once weekly, Disp: 12 each, Rfl: 1   Vitamin D, Ergocalciferol, (DRISDOL) 1.25 MG (50000 UNIT) CAPS capsule, Take 1 capsule (50,000 Units total) by mouth every 7 (seven) days., Disp: 12 capsule, Rfl: 0   LORazepam (ATIVAN) 1 MG tablet, Take 1/2 - 1.5 tablets (0.5-1.5mg ) by mouth at bedtime as needed., Disp:  45 tablet, Rfl: 5   rosuvastatin (CRESTOR) 10 MG tablet, Take 1 tablet (10 mg total) by mouth daily., Disp: 90 tablet, Rfl: 1  Allergies  Allergen Reactions   Sulfa Antibiotics Nausea And Vomiting    Other Reaction(s): abdominal pain  Other Reaction(s): nausea/vomiting   Other Other (See Comments)    glucagon-like peptide 1-constipation   ROS neg/noncontributory except as noted HPI/below      Objective:     BP 130/70   Pulse 92   Temp 98.3 F (36.8 C) (Temporal)   Resp 18   Ht 5\' 11"  (1.803 m)   Wt 230 lb 4 oz (104.4 kg)   SpO2 95%   BMI 32.11 kg/m  Wt Readings from Last 3 Encounters:  11/10/22 230 lb 4 oz (104.4 kg)  11/02/22 231 lb (104.8 kg)  05/19/22 225 lb 4 oz (102.2 kg)    Physical Exam   Gen: WDWN NAD HEENT: NCAT, conjunctiva not injected, sclera nonicteric NECK:  supple, no thyromegaly, no nodes, no carotid bruits CARDIAC: RRR, S1S2+, no murmur. DP 2+B LUNGS: CTAB. No wheezes ABDOMEN:  BS+, soft, NTND, No HSM, no masses EXT:  no edema MSK: no gross abnormalities.  NEURO: A&O x3.  CN II-XII intact.  PSYCH: normal mood. Good eye contact  Reviewed labs from Dr. 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  Controlled.  Continue trazodone 50mg  at hs and ativan 1mg  1/2-1.5 tabs at hs.  PDMP checked   Prediabetes Assessment & Plan: Chronic.  Controlled.  Continue diet/exercise.  Currently on Zepbound, but insurance not paying.     Abnormal screening  cardiac CT, 02/27/20, score 336, 89th% Assessment & Plan: Chronic.  Asymptomatic.  Continue ASA 81mg  and crestor 10mg .  Work on diet/exercise   Other orders -     Rosuvastatin Calcium; Take 1 tablet (10 mg total) by mouth daily.  Dispense: 90 tablet; Refill: 1 -     LORazepam; Take 1/2 - 1.5 tablets (0.5-1.5mg ) by mouth at bedtime as needed.  Dispense: 45 tablet; Refill: 5    Return in about 6 months (around 05/12/2023) for chronic follow-up, annual physical.  Angelena Sole, MD

## 2022-11-10 NOTE — Assessment & Plan Note (Signed)
Chronic.  Controlled.  Continue diet/exercise.  Currently on Zepbound, but insurance not paying.

## 2022-11-10 NOTE — Patient Instructions (Signed)

## 2022-11-10 NOTE — Assessment & Plan Note (Signed)
Chronic.  Asymptomatic.  Continue ASA 81mg  and crestor 10mg .  Work on diet/exercise

## 2022-11-10 NOTE — Assessment & Plan Note (Signed)
Chronic.  Controlled.  Continue trazodone 50mg  at hs and ativan 1mg  1/2-1.5 tabs at hs.  PDMP checked

## 2022-11-11 ENCOUNTER — Other Ambulatory Visit (HOSPITAL_COMMUNITY): Payer: Self-pay

## 2022-12-20 ENCOUNTER — Other Ambulatory Visit: Payer: Self-pay

## 2022-12-23 ENCOUNTER — Other Ambulatory Visit (HOSPITAL_COMMUNITY): Payer: Self-pay

## 2022-12-23 MED ORDER — CYCLOBENZAPRINE HCL 5 MG PO TABS
5.0000 mg | ORAL_TABLET | Freq: Every day | ORAL | 0 refills | Status: DC
  Filled 2022-12-23: qty 90, 90d supply, fill #0

## 2023-01-19 ENCOUNTER — Other Ambulatory Visit: Payer: Self-pay | Admitting: Internal Medicine

## 2023-01-19 ENCOUNTER — Other Ambulatory Visit (HOSPITAL_COMMUNITY): Payer: Self-pay

## 2023-01-19 DIAGNOSIS — K51311 Ulcerative (chronic) rectosigmoiditis with rectal bleeding: Secondary | ICD-10-CM

## 2023-01-19 MED ORDER — MESALAMINE 1.2 G PO TBEC
4.8000 g | DELAYED_RELEASE_TABLET | Freq: Every day | ORAL | 1 refills | Status: DC
Start: 2023-01-19 — End: 2023-07-13
  Filled 2023-01-19: qty 360, 90d supply, fill #0
  Filled 2023-04-18: qty 360, 90d supply, fill #1

## 2023-01-30 ENCOUNTER — Other Ambulatory Visit (HOSPITAL_COMMUNITY): Payer: Self-pay

## 2023-01-31 ENCOUNTER — Other Ambulatory Visit: Payer: Self-pay

## 2023-01-31 ENCOUNTER — Other Ambulatory Visit (HOSPITAL_COMMUNITY): Payer: Self-pay

## 2023-02-11 ENCOUNTER — Other Ambulatory Visit (HOSPITAL_COMMUNITY): Payer: Self-pay

## 2023-03-23 ENCOUNTER — Other Ambulatory Visit (HOSPITAL_COMMUNITY): Payer: Self-pay

## 2023-03-24 ENCOUNTER — Other Ambulatory Visit (HOSPITAL_COMMUNITY): Payer: Self-pay

## 2023-03-24 MED ORDER — CYCLOBENZAPRINE HCL 5 MG PO TABS
5.0000 mg | ORAL_TABLET | Freq: Every evening | ORAL | 0 refills | Status: DC | PRN
  Filled 2023-03-24: qty 90, 90d supply, fill #0

## 2023-03-29 ENCOUNTER — Other Ambulatory Visit (HOSPITAL_COMMUNITY): Payer: Self-pay

## 2023-03-29 ENCOUNTER — Other Ambulatory Visit: Payer: Self-pay

## 2023-03-29 MED ORDER — TRAZODONE HCL 50 MG PO TABS
50.0000 mg | ORAL_TABLET | Freq: Every evening | ORAL | 1 refills | Status: DC | PRN
  Filled 2023-04-20: qty 90, 90d supply, fill #0
  Filled 2023-07-21: qty 90, 90d supply, fill #1

## 2023-03-29 MED ORDER — BUPROPION HCL ER (SR) 100 MG PO TB12
100.0000 mg | ORAL_TABLET | Freq: Every day | ORAL | 3 refills | Status: DC
  Filled 2023-05-16: qty 90, 90d supply, fill #0
  Filled 2023-08-14: qty 90, 90d supply, fill #1
  Filled 2023-11-22: qty 90, 90d supply, fill #2
  Filled 2024-02-15: qty 90, 90d supply, fill #3

## 2023-04-18 ENCOUNTER — Other Ambulatory Visit (HOSPITAL_COMMUNITY): Payer: Self-pay

## 2023-04-20 ENCOUNTER — Other Ambulatory Visit (HOSPITAL_COMMUNITY): Payer: Self-pay

## 2023-05-06 ENCOUNTER — Other Ambulatory Visit: Payer: Self-pay | Admitting: Family Medicine

## 2023-05-07 ENCOUNTER — Other Ambulatory Visit (HOSPITAL_COMMUNITY): Payer: Self-pay

## 2023-05-07 MED ORDER — ROSUVASTATIN CALCIUM 10 MG PO TABS
10.0000 mg | ORAL_TABLET | Freq: Every day | ORAL | 0 refills | Status: DC
  Filled 2023-05-07: qty 90, 90d supply, fill #0

## 2023-05-09 ENCOUNTER — Other Ambulatory Visit (HOSPITAL_COMMUNITY): Payer: Self-pay

## 2023-05-09 ENCOUNTER — Other Ambulatory Visit: Payer: Self-pay

## 2023-05-10 ENCOUNTER — Encounter: Payer: Self-pay | Admitting: Emergency Medicine

## 2023-05-10 ENCOUNTER — Ambulatory Visit
Admission: EM | Admit: 2023-05-10 | Discharge: 2023-05-10 | Disposition: A | Discharge status: Home / Self Care | Payer: Managed Care, Other (non HMO) | Attending: Family Medicine | Admitting: Family Medicine

## 2023-05-10 DIAGNOSIS — J3489 Other specified disorders of nose and nasal sinuses: Secondary | ICD-10-CM

## 2023-05-10 DIAGNOSIS — J0100 Acute maxillary sinusitis, unspecified: Secondary | ICD-10-CM

## 2023-05-10 MED ORDER — AMOXICILLIN-POT CLAVULANATE 875-125 MG PO TABS: 1.0000 | ORAL_TABLET | Freq: Two times a day (BID) | ORAL | 0 refills | Status: DC

## 2023-05-10 MED ORDER — PREDNISONE 20 MG PO TABS: ORAL_TABLET | ORAL | 0 refills | Status: DC

## 2023-05-10 NOTE — Discharge Instructions (Addendum)
 Advised patient to take medications as directed with food to completion.  Advised patient to take prednisone with first dose of Augmentin for the next 5 of 7 days.  Encouraged to increase daily water intake to 64 ounces per day while taking these medications.  Advised if symptoms worsen and/or unresolved please follow-up with PCP or here for further evaluation.

## 2023-05-10 NOTE — ED Triage Notes (Signed)
Patient presents to Urgent Care with complaints of sore throat, right ear pain, dysphagia since 4 days ago. Patient reports symptoms progressive. Took home COVID and flu test was negative. Taking Tylenol for symptoms not helping. Also having nasal congestion, sinus pressure on right side.

## 2023-05-10 NOTE — ED Provider Notes (Signed)
[facility]    CSN: [id number] Arrival date & time: 05/10/23  0907      History   Chief Complaint Chief Complaint  Patient presents with   Sore Throat   Facial Pain   Otalgia    HPI David Newman is a 62 y.o. male.   HPI Very pleasant 62 year old male presents with sore throat, right ear pain and difficulty swallowing 4 days ago.  PMH significant for CAD, HLD, and obesity  Past Medical History:  Diagnosis Date   Allergy Child   Sulfa Drugs   Anal fissure    Back pain    CAD (coronary artery disease)    Colon polyps    hyperplastic   DDD (degenerative disc disease), lumbar    GERD (gastroesophageal reflux disease)    Gout    High blood pressure    Hyperlipidemia    Joint pain    Knee pain    OA (osteoarthritis) of knee    Prediabetes    Right knee meniscal tear    Sleep apnea    Wears CPAP.   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   Low back pain 04/20/2018   Class 1 obesity with serious comorbidity and body mass index (BMI) of 32.0 to 32.9 in adult 08/05/2017   OSA on CPAP 04/05/2017   Ulcerative colitis (HCC) 11/04/2015   Adjustment insomnia 11/04/2015   Allergic rhinitis 11/03/2015   S/P right knee arthroscopy 03/29/2014    Past Surgical History:  Procedure Laterality Date   COLONOSCOPY  04/24/2012   FACIAL RECONSTRUCTION SURGERY  1973  age  42   kicked in face by horse   KNEE ARTHROSCOPY Right 03/29/2014   Procedure: RIGHT ARTHROSCOPY KNEE WITH DEBRIDMENT PARTIAL medial lateral MENISCECTOMY AND CHONDROPLASTY;  Surgeon: Eugenia Mcalpine, MD;  Location: [facility] [place];  Service: Orthopedics;  Laterality: Right;   KNEE ARTHROSCOPY WITH ANTERIOR CRUCIATE LIGAMENT (ACL) REPAIR Right 05/17/1992   KNEE ARTHROSCOPY WITH MEDIAL MENISECTOMY Left 12/28/2018   Procedure: KNEE ARTHROSCOPY WITH partial MEDIAL MENISECTOMY, debridement chondroplasty;  Surgeon: Eugenia Mcalpine, MD;  Location: [facility];  Service: Orthopedics;  Laterality: Left;  with knee block   WISDOM TOOTH EXTRACTION  age 68       Home Medications    Prior to Admission medications   Medication Sig Start Date End Date Taking? Authorizing Provider  amoxicillin-clavulanate (AUGMENTIN) 875-125 MG tablet Take 1 tablet by mouth every 12 (twelve) hours. 05/10/23  Yes Trevor Iha, FNP  aspirin EC 81 MG tablet 1 tablet Orally Once a day   Yes [provider]  B Complex Vitamins (VITAMIN B COMPLEX) TABS Take 1 tablet by mouth daily.   Yes [provider]  betamethasone dipropionate 0.05 % lotion Apply 1 application (a small amount) topically to the affected area on the scalp once a day 10/26/21  Yes   buPROPion ER (WELLBUTRIN SR) 100 MG 12  hr tablet Take 1 tablet (100 mg total) by mouth daily. 09/22/22  Yes   buPROPion ER (WELLBUTRIN SR) 100 MG 12 hr tablet Take 1 tablet (100 mg total) by mouth daily. 03/29/23  Yes   calcium carbonate (OS-CAL) 1250 (500 Ca) MG chewable tablet Chew by mouth.   Yes [provider]  Cholecalciferol (VITAMIN D3) 1.25 MG (50000 UT) CAPS Take 1 capsule (1.25 mg total) by mouth once a week on Saturday 05/25/22  Yes   Cholecalciferol (VITAMIN D3) 1.25 MG (50000 UT) CAPS Take 1 capsule (50,000 Units total) by mouth every 14 (fourteen) days. 09/22/22  Yes   Cholecalciferol (VITAMIN D3) 1.25 MG (50000 UT)  capsule Take 1 capsule by mouth once a week on Saturdays 03/20/22  Yes   cyanocobalamin (,VITAMIN B-12,) 1000 MCG/ML injection Inject 1 mL (1,000 mcg total) into the skin once a week for 4 weeks then 1 mL every 30 (thirty) days. 07/13/21  Yes Helane Rima, DO  cyanocobalamin (VITAMIN B12) 1000 MCG/ML injection Inject 1 mL (1,000 mcg total) into the muscle every 30 (thirty) days. 09/06/22  Yes   cyanocobalamin (VITAMIN B12) 1000 MCG/ML injection Inject 1 mL (1,000 mcg total) into the muscle every month 09/22/22  Yes   cyclobenzaprine (FLEXERIL) 5 MG tablet Take 1 tablet (5 mg total) by mouth at bedtime as needed. 07/08/22  Yes   cyclobenzaprine (FLEXERIL) 5 MG tablet Take 1 tablet (5 mg total) by mouth at bedtime as needed. 03/24/23  Yes   Insulin Syringe-Needle U-100 (B-D INS SYR MICROFINE 1CC/27G) 27G X 5/8" 1 ML MISC Use to administer b12 injection once a week 12/01/21  Yes   Insulin Syringe-Needle U-100 27G X 5/8" 1 ML MISC use to administer b-12 injection into the skin once weekly 12/16/21  Yes Helane Rima, DO  ketoconazole (NIZORAL) 2 % cream Apply topically to affected area 2 (two) times daily for 2 weeks for flares on feet as directed 08/04/22  Yes   LORazepam (ATIVAN) 1 MG tablet Take 1/2-1 tablet (0.5-1 mg total) by mouth at bedtime as needed. 04/20/22  Yes   LORazepam (ATIVAN) 1 MG tablet Take 1/2 - 1.5 tablets (0.5-1.5mg ) by mouth at bedtime as needed. 11/10/22  Yes Jeani Sow, MD  melatonin 3 MG TABS tablet Take by mouth.   Yes [provider]  mesalamine (LIALDA) 1.2 g EC tablet Take 4 tablets (4.8 g total) by mouth daily with breakfast. 01/19/23  Yes Hilarie Fredrickson, MD  metroNIDAZOLE (METROGEL) 1 % gel Apply 1 application ( a small amount)  topically  to face daily. 10/27/21  Yes   predniSONE (DELTASONE) 20 MG tablet Take 3 tabs PO daily x 5 days. 05/10/23  Yes Trevor Iha, FNP  rosuvastatin (CRESTOR) 10 MG tablet Take 1 tablet (10 mg total) by mouth daily. 04/20/22  Yes    rosuvastatin (CRESTOR) 10 MG tablet Take 1 tablet (10 mg total) by mouth daily. 05/07/23  Yes Jeani Sow, MD  Syringe, Disposable, (BD PLASTIPAK SYRINGE) 3 ML MISC Use to inject B12 09/06/22  Yes Helane Rima, DO  Syringe/Needle, Disp, (SYRINGE 3CC/25GX1") 25G X 1" 3 ML MISC For B12 injections. 07/13/21  Yes Helane Rima, DO  tirzepatide (ZEPBOUND) 10 MG/0.5ML Pen Inject 10 mg into the skin once a week. 08/11/22  Yes   tirzepatide (ZEPBOUND) 15 MG/0.5ML Pen Inject 15 mg into the skin once a week. 07/21/22  Yes   tirzepatide (ZEPBOUND) 2.5 MG/0.5ML Pen Inject 2.5 mg into the skin once  a week. 05/04/22  Yes   tirzepatide (ZEPBOUND) 5 MG/0.5ML Pen Inject 5 mg into the skin once a week. 07/12/22  Yes   tirzepatide (ZEPBOUND) 7.5 MG/0.5ML Pen Inject 7.5 mg into the skin once a week. 07/21/22  Yes   tirzepatide (ZEPBOUND) 7.5 MG/0.5ML Pen Inject 7.5 mg into the skin once a week. 08/11/22  Yes   traZODone (DESYREL) 50 MG tablet Take 1 tablet (50 mg total) by mouth at bedtime as needed. 01/13/22  Yes   traZODone (DESYREL) 50 MG tablet Take 1 tablet (50 mg total) by mouth at bedtime as needed. 03/29/23  Yes   TUBERCULIN SYR 1CC/27GX1/2" (B-D TB SYRINGE 1CC/27GX1/2") 27G X 1/2" 1 ML MISC Use to inject vitamin b-12 once weekly 09/22/22  Yes   Vitamin D, Ergocalciferol, (DRISDOL) 1.25 MG (50000 UNIT) CAPS capsule Take 1 capsule (50,000 Units total) by mouth every 7 (seven) days. 07/13/21  Yes Helane Rima, DO    Family History Family History  Problem Relation Age of Onset   Alzheimer's disease Mother    Heart disease Father 52 - 69   Skin cancer Father    Hypertension Father    Hyperlipidemia Father    Stroke Father    Arthritis Father    Cancer Father    Colon polyps Brother 53   Heart disease Brother 55   Colon cancer Paternal Uncle 56   Colon cancer Cousin 50   Stomach cancer Neg Hx     Social History Social History   Tobacco Use   Smoking status: Former    Current packs/day: 0.00     Average packs/day: 1 pack/day for 25.0 years (25.0 ttl pk-yrs)    Types: Cigarettes    Start date: 04/03/1982    Quit date: 04/04/2007    Years since quitting: 16.1   Smokeless tobacco: Never  Substance Use Topics   Alcohol use: Yes    Alcohol/week: 2.0 standard drinks of alcohol    Types: 2 Cans of beer per week    Comment: OCCASIONAL   Drug use: No     Allergies   Sulfa antibiotics and Other   Review of Systems Review of Systems  HENT:  Positive for congestion, ear pain and sore throat.      Physical Exam Triage Vital Signs ED Triage Vitals  Encounter Vitals Group     BP 05/10/23 0949 (!) 142/97     Systolic BP Percentile --      Diastolic BP Percentile --      Pulse Rate 05/10/23 0949 91     Resp 05/10/23 0949 18     Temp 05/10/23 0949 99.2 F (37.3 C)     Temp Source 05/10/23 0949 Oral     SpO2 05/10/23 0949 97 %     Weight --      Height --      Head Circumference --      Peak Flow --      Pain Score 05/10/23 0947 6     Pain Loc --      Pain Education --      Exclude from Growth Chart --    No data found.  Updated Vital Signs BP (!) 142/97 (BP Location: Right Arm)   Pulse 91   Temp 99.2 F (37.3 C) (Oral)   Resp 18   SpO2 97%    Physical Exam Vitals and nursing note reviewed.  Constitutional:      Appearance: Normal appearance. He is well-developed. He is  obese. He is ill-appearing.  HENT:     Head: Normocephalic and atraumatic.     Right Ear: Tympanic membrane and external ear normal.     Left Ear: Tympanic membrane and external ear normal.     Ears:     Comments: Significant eustachian tube dysfunction noted bilaterally    Nose:     Right Sinus: Maxillary sinus tenderness present.     Left Sinus: Maxillary sinus tenderness present.     Comments: Turbinates are erythematous/edematous    Mouth/Throat:     Mouth: Mucous membranes are moist.     Pharynx: Oropharynx is clear.  Eyes:     Extraocular Movements: Extraocular movements intact.      Conjunctiva/sclera: Conjunctivae normal.     Pupils: Pupils are equal, round, and reactive to light.  Cardiovascular:     Rate and Rhythm: Normal rate and regular rhythm.     Pulses: Normal pulses.     Heart sounds: Normal heart sounds.  Pulmonary:     Effort: Pulmonary effort is normal.     Breath sounds: Normal breath sounds. No wheezing, rhonchi or rales.  Musculoskeletal:        General: Normal range of motion.     Cervical back: Normal range of motion and neck supple.  Skin:    General: Skin is warm and dry.  Neurological:     General: No focal deficit present.     Mental Status: He is alert and oriented to person, place, and time.  Psychiatric:        Mood and Affect: Mood normal.        Behavior: Behavior normal.      UC Treatments / Results  Labs (all labs ordered are listed, but only abnormal results are displayed) Labs Reviewed - No data to display  EKG   Radiology No results found.  Procedures Procedures (including critical care time)  Medications Ordered in UC Medications - No data to display  Initial Impression / Assessment and Plan / UC Course  I have reviewed the triage vital signs and the nursing notes.  Pertinent labs & imaging results that were available during my care of the patient were reviewed by me and considered in my medical decision making (see chart for details).     MDM: Acute maxillary sinusitis, recurrence not specified-Rx'd Augmentin 875/125 mg tablet: Take 1 tablet twice daily x 7 days; 2.  Sinus pressure-Rx'd prednisone 20 mg tablet: Take 3 tabs p.o. daily x 5 days. Advised patient to take medications as directed with food to completion.  Advised patient to take prednisone with first dose of Augmentin for the next 5 of 7 days.  Encouraged to increase daily water intake to 64 ounces per day while taking these medications.  Advised if symptoms worsen and/or unresolved please follow-up with PCP or here for further evaluation.  Patient  discharged home, hemodynamically stable. Final Clinical Impressions(s) / UC Diagnoses   Final diagnoses:  Acute maxillary sinusitis, recurrence not specified  Sinus pressure     Discharge Instructions      Advised patient to take medications as directed with food to completion.  Advised patient to take prednisone with first dose of Augmentin for the next 5 of 7 days.  Encouraged to increase daily water intake to 64 ounces per day while taking these medications.  Advised if symptoms worsen and/or unresolved please follow-up with PCP or here for further evaluation.     ED Prescriptions     Medication  Sig Dispense Auth. Provider   amoxicillin-clavulanate (AUGMENTIN) 875-125 MG tablet Take 1 tablet by mouth every 12 (twelve) hours. 14 tablet Trevor Iha, FNP   predniSONE (DELTASONE) 20 MG tablet Take 3 tabs PO daily x 5 days. 15 tablet Trevor Iha, FNP      PDMP not reviewed this encounter.   Trevor Iha, FNP 05/10/23 1059

## 2023-05-16 ENCOUNTER — Other Ambulatory Visit (HOSPITAL_COMMUNITY): Payer: Self-pay

## 2023-05-19 ENCOUNTER — Other Ambulatory Visit (HOSPITAL_COMMUNITY): Payer: Self-pay

## 2023-05-20 ENCOUNTER — Other Ambulatory Visit (HOSPITAL_COMMUNITY): Payer: Self-pay

## 2023-05-23 ENCOUNTER — Encounter: Payer: Managed Care, Other (non HMO) | Admitting: Family Medicine

## 2023-05-24 ENCOUNTER — Other Ambulatory Visit (HOSPITAL_COMMUNITY): Payer: Self-pay

## 2023-05-24 MED ORDER — ZEPBOUND 2.5 MG/0.5ML ~~LOC~~ SOAJ
2.5000 mg | SUBCUTANEOUS | 0 refills | Status: DC
  Filled 2023-05-24: qty 2, 28d supply, fill #0

## 2023-05-25 ENCOUNTER — Other Ambulatory Visit (HOSPITAL_COMMUNITY): Payer: Self-pay

## 2023-06-06 ENCOUNTER — Ambulatory Visit (INDEPENDENT_AMBULATORY_CARE_PROVIDER_SITE_OTHER): Payer: Managed Care, Other (non HMO) | Admitting: Family Medicine

## 2023-06-06 ENCOUNTER — Encounter: Payer: Self-pay | Admitting: Family Medicine

## 2023-06-06 ENCOUNTER — Other Ambulatory Visit (HOSPITAL_COMMUNITY): Payer: Self-pay

## 2023-06-06 VITALS — BP 117/82 | HR 82 | Temp 97.9°F | Resp 18 | Ht 71.0 in | Wt 234.4 lb

## 2023-06-06 DIAGNOSIS — E782 Mixed hyperlipidemia: Secondary | ICD-10-CM

## 2023-06-06 DIAGNOSIS — Z0000 Encounter for general adult medical examination without abnormal findings: Secondary | ICD-10-CM

## 2023-06-06 DIAGNOSIS — F5101 Primary insomnia: Secondary | ICD-10-CM | POA: Insufficient documentation

## 2023-06-06 DIAGNOSIS — Z79899 Other long term (current) drug therapy: Secondary | ICD-10-CM

## 2023-06-06 DIAGNOSIS — B351 Tinea unguium: Secondary | ICD-10-CM | POA: Diagnosis not present

## 2023-06-06 MED ORDER — ROSUVASTATIN CALCIUM 10 MG PO TABS
10.0000 mg | ORAL_TABLET | Freq: Every day | ORAL | 1 refills | Status: DC
  Filled 2023-06-06 – 2023-08-14 (×2): qty 90, 90d supply, fill #0
  Filled 2023-11-08: qty 90, 90d supply, fill #1

## 2023-06-06 MED ORDER — TERBINAFINE HCL 250 MG PO TABS
250.0000 mg | ORAL_TABLET | Freq: Every day | ORAL | 0 refills | Status: DC
  Filled 2023-06-06 (×3): qty 90, 90d supply, fill #0

## 2023-06-06 MED ORDER — LORAZEPAM 1 MG PO TABS
0.5000 mg | ORAL_TABLET | Freq: Every evening | ORAL | 5 refills | Status: DC | PRN
  Filled 2023-06-06: qty 45, 30d supply, fill #0
  Filled 2023-07-13: qty 45, 30d supply, fill #1
  Filled 2023-10-04: qty 45, 30d supply, fill #2
  Filled 2023-11-08: qty 45, 30d supply, fill #3

## 2023-06-06 NOTE — Progress Notes (Signed)
Phone: [phone]   Subjective:  Patient 63 y.o. male presenting for annual physical.  Chief Complaint  Patient presents with   Annual Exam    CPE Not fasting, had labs done at Dr. Earlene Plater about 3 weeks ago    Annual.  Did labs w/Dr. Earlene Plater 3 wks ago.  Started exercising HLD-taking crestor 10mg  . Has elevated calcium sore and FH CAD OSA-tried to get Mounjaro approved but not approved.  PreDM-working on diet/exercise-going to wt clinic Insomnia-taking lorazepam intermitt but more freq lately d/t job stress.   Discussed the use of AI scribe software for clinical note transcription with the patient, who gave verbal consent to proceed.  History of Present Illness   The patient, a middle-aged individual with a history of ulcerative colitis, sleep apnea, and a fungal infection in the right big toe, reports overall good health. They have been adhering to their prescribed medication regimen, which includes rosuvastatin 10mg , bupropion 100mg , vitamin B12, and mesalamine. They also take lorazepam as needed for sleep.  The patient has been managing their sleep apnea with a CPAP machine, although they occasionally fall asleep before putting it on. They report no significant side effects from the machine and generally wear it every night.  Regarding their ulcerative colitis, the patient reports that their bowel movements have been regular and under control with the current medication.  The patient has been experiencing issues with a fungal infection in their right big toe for several years. They have tried various creams and a blue light treatment, which they report has shown some improvement. However, they are interested in exploring oral medication options to further treat the infection.  In terms of lifestyle, the patient has recently made significant changes. They have been exercising daily since the start of the new year, including floor work for knee and back rehabilitation, dumbbell  workouts, and stationary biking. They have also been tracking their diet using a fitness app, aiming for a daily intake of 1800 calories or less, with a focus on high protein and low fat.  The patient reports no major headaches, dizziness, passing out, blurry vision, double vision, sore throat, hoarseness, trouble swallowing, chest pains, heart racing, skipping beats, coughing, wheezing, shortness of breath, or trouble urinating. They do report some joint pain in the knees, which they attribute to a lack of cartilage, but state that it does not significantly impact their daily activities.       See problem oriented charting- ROS- ROS: Gen: no fever, chills  Skin: no rash, itching ENT: no ear pain, ear drainage, nasal congestion, rhinorrhea, sinus pressure, sore throat Eyes: no blurry vision, double vision Resp: no cough, wheeze,SOB CV: no CP, palpitations, LE edema,  GI: no heartburn, n/v/d/c, abd pain GU: no dysuria, urgency, frequency, hematuria MSK: no , myalgias, back pain.  Chronic knees Neuro: no dizziness, headache, weakness, vertigo Psych: no  SI .  Moods stable  The following were reviewed and entered/updated in epic: Past Medical History:  Diagnosis Date   Allergy Child   Sulfa Drugs   Anal fissure    Back pain    CAD (coronary artery disease)    Colon polyps    hyperplastic   DDD (degenerative disc disease), lumbar    GERD (gastroesophageal reflux disease)    Gout    High blood pressure    Hyperlipidemia    Joint pain    Knee pain    OA (osteoarthritis) of knee    Prediabetes    Right knee  meniscal tear    Sleep apnea    Wears CPAP.   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HCC)    Wears contact lenses    Wears glasses    Patient Active Problem List   Diagnosis Date Noted   Primary insomnia 06/06/2023   Aortic atherosclerosis (HCC), Cardiac CT 02/27/20 03/04/2020   Esophageal thickening, Cardiac CT 02/27/20 03/04/2020   Abnormal screening cardiac CT, 02/27/20, score  336, 89th% 03/04/2020   Visceral obesity 03/04/2020   Mixed hyperlipidemia, at goal, Rx Crestor 03/04/2020   Metabolic syndrome 03/04/2020   Vitamin D deficiency, at goal with supplementation 11/13/2019   B12 deficiency, at goal with supplementation 11/13/2019   Prediabetes 11/13/2019   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   Low back pain 04/20/2018   Class 1 obesity with serious comorbidity and body mass index (BMI) of 32.0 to 32.9 in adult 08/05/2017   OSA on CPAP 04/05/2017   Ulcerative colitis (HCC) 11/04/2015   Adjustment insomnia 11/04/2015   Allergic rhinitis 11/03/2015   S/P right knee arthroscopy 03/29/2014   Past Surgical History:  Procedure Laterality Date   COLONOSCOPY  04/24/2012   FACIAL RECONSTRUCTION SURGERY  1973  age 15   kicked in face by horse   KNEE ARTHROSCOPY Right 03/29/2014   Procedure: RIGHT ARTHROSCOPY KNEE WITH DEBRIDMENT PARTIAL medial lateral MENISCECTOMY AND CHONDROPLASTY;  Surgeon: Eugenia Mcalpine, MD;  Location: [facility] [place];  Service: Orthopedics;  Laterality: Right;   KNEE ARTHROSCOPY WITH ANTERIOR CRUCIATE LIGAMENT (ACL) REPAIR Right 05/17/1992   KNEE ARTHROSCOPY WITH MEDIAL MENISECTOMY Left 12/28/2018   Procedure: KNEE ARTHROSCOPY WITH partial MEDIAL MENISECTOMY, debridement chondroplasty;  Surgeon: Eugenia Mcalpine, MD;  Location: [facility];  Service: Orthopedics;  Laterality: Left;  with knee block   WISDOM TOOTH EXTRACTION  age 87    Family History  Problem Relation Age of Onset   Alzheimer's disease Mother    Heart disease Father 38 - 69   Skin cancer Father    Hypertension Father    Hyperlipidemia Father    Stroke Father    Arthritis Father    Cancer Father    Colon polyps Brother 80   Heart disease Brother 56   Colon cancer Paternal Uncle 70   Colon cancer Cousin 50   Stomach cancer Neg Hx     Medications- reviewed and updated Current Outpatient Medications  Medication Sig Dispense Refill    aspirin EC 81 MG tablet 1 tablet Orally Once a day     B Complex Vitamins (VITAMIN B COMPLEX) TABS Take 1 tablet by mouth daily.     betamethasone dipropionate 0.05 % lotion Apply 1 application (a small amount) topically to the affected area on the scalp once a day 60 mL 2   buPROPion ER (WELLBUTRIN SR) 100 MG 12 hr tablet Take 1 tablet (100 mg total) by mouth daily. 90 tablet 3   calcium carbonate (OS-CAL) 1250 (500 Ca) MG chewable tablet Chew by mouth.     Cholecalciferol (VITAMIN D3) 1.25 MG (50000 UT) CAPS Take 1 capsule (50,000 Units total) by mouth every 14 (fourteen) days. 12 capsule 3   cyanocobalamin (,VITAMIN B-12,) 1000 MCG/ML injection Inject 1 mL (1,000 mcg total) into the skin once a week for 4 weeks then 1 mL every 30 (thirty) days. 1 mL 15   cyanocobalamin (VITAMIN B12) 1000 MCG/ML injection Inject 1 mL (1,000 mcg total) into the muscle every 30 (thirty) days. 12 mL 0   cyclobenzaprine (FLEXERIL) 5 MG tablet Take 1  tablet (5 mg total) by mouth at bedtime as needed. 90 tablet 0   Insulin Syringe-Needle U-100 (B-D INS SYR MICROFINE 1CC/27G) 27G X 5/8" 1 ML MISC Use to administer b12 injection once a week 12 each 0   Insulin Syringe-Needle U-100 27G X 5/8" 1 ML MISC use to administer b-12 injection into the skin once weekly 12 each 0   ketoconazole (NIZORAL) 2 % cream Apply topically to affected area 2 (two) times daily for 2 weeks for flares on feet as directed 60 g 2   melatonin 3 MG TABS tablet Take by mouth.     mesalamine (LIALDA) 1.2 g EC tablet Take 4 tablets (4.8 g total) by mouth daily with breakfast. 360 tablet 1   metroNIDAZOLE (METROGEL) 1 % gel Apply 1 application ( a small amount)  topically  to face daily. 60 g 5   Syringe, Disposable, (BD PLASTIPAK SYRINGE) 3 ML MISC Use to inject B12 12 each 0   Syringe/Needle, Disp, (SYRINGE 3CC/25GX1") 25G X 1" 3 ML MISC For B12 injections. 50 each 0   terbinafine (LAMISIL) 250 MG tablet Take 1 tablet (250 mg total) by mouth daily  for 12 weeks of treatment 90 tablet 0   traZODone (DESYREL) 50 MG tablet Take 1 tablet (50 mg total) by mouth at bedtime as needed. 90 tablet 1   TUBERCULIN SYR 1CC/27GX1/2" (B-D TB SYRINGE 1CC/27GX1/2") 27G X 1/2" 1 ML MISC Use to inject vitamin b-12 once weekly 12 each 1   Vitamin D, Ergocalciferol, (DRISDOL) 1.25 MG (50000 UNIT) CAPS capsule Take 1 capsule (50,000 Units total) by mouth every 7 (seven) days. 12 capsule 0   LORazepam (ATIVAN) 1 MG tablet Take 1/2 - 1&1/2 tablets (0.5-1.5mg ) by mouth at bedtime as needed. 45 tablet 5   rosuvastatin (CRESTOR) 10 MG tablet Take 1 tablet (10 mg total) by mouth daily. 90 tablet 1   No current facility-administered medications for this visit.    Allergies-reviewed and updated Allergies  Allergen Reactions   Sulfa Antibiotics Nausea And Vomiting    Other Reaction(s): abdominal pain  Other Reaction(s): nausea/vomiting   Other Other (See Comments)    glucagon-like peptide 1-constipation    Social History   Social History Narrative   2 grands   Objective  Objective:  BP 117/82   Pulse 82   Temp 97.9 F (36.6 C) (Temporal)   Resp 18   Ht 5\' 11"  (1.803 m)   Wt 234 lb 6 oz (106.3 kg)   SpO2 98%   BMI 32.69 kg/m  Physical Exam  Gen: WDWN NAD HEENT: NCAT, conjunctiva not injected, sclera nonicteric TM WNL B, OP moist, no exudates  NECK:  supple, no thyromegaly, no nodes, no carotid bruits CARDIAC: RRR, S1S2+, no murmur. DP 2+B LUNGS: CTAB. No wheezes ABDOMEN:  BS+, soft, NTND, No HSM, no masses EXT:  no edema MSK: no gross abnormalities. MS 5/5 all 4 NEURO: A&O x3.  CN II-XII intact.  PSYCH: normal mood. Good eye contact   Reviewed labs from [facility]   Assessment and Plan   Health Maintenance counseling: 1. Anticipatory guidance: Patient counseled regarding regular dental exams q6 months, eye exams yearly, avoiding smoking and second hand smoke, limiting alcohol to 2 beverages per day.   2. Risk factor reduction:  Advised  patient of need for regular exercise and diet rich in fruits and vegetables to reduce risk of heart attack and stroke. Exercise- +.   Wt Readings from Last 3 Encounters:  06/06/23 234  lb 6 oz (106.3 kg)  11/10/22 230 lb 4 oz (104.4 kg)  11/02/22 231 lb (104.8 kg)   3. Immunizations/screenings/ancillary studies Immunization History  Administered Date(s) Administered   Influenza Split 03/07/2017, 02/26/2020   Influenza-Unspecified 03/07/2017, 03/08/2018   PFIZER Comirnaty(Gray Top)Covid-19 Tri-Sucrose Vaccine 10/31/2020   PFIZER(Purple Top)SARS-COV-2 Vaccination 06/01/2019, 07/02/2019, 03/14/2020   Pfizer Covid-19 Vaccine Bivalent Booster 55yrs & up 03/20/2021, 03/30/2021   Td 05/17/2006   Td (Adult),5 Lf Tetanus Toxid, Preservative Free 09/01/2016   Tdap 05/17/2006, 09/01/2016   There are no preventive care reminders to display for this patient.   4. Prostate cancer screening >55yo - risk factors? No results found for: "PSA"  5. Colon cancer screening:UTD 6. Skin cancer screening- Iadvised regular sunscreen use. Denies worrisome, changing, or new skin lesions.  7. Smoking associated screening (lung cancer screening, AAA screen 65-75, UA)- non smoker- Wellness examination  Onychomycosis -     Terbinafine HCl; Take 1 tablet (250 mg total) by mouth daily for 12 weeks of treatment  Dispense: 90 tablet; Refill: 0  High risk medication use -     Hepatic function panel; Future  Mixed hyperlipidemia, at goal, Rx Crestor -     Rosuvastatin Calcium; Take 1 tablet (10 mg total) by mouth daily.  Dispense: 90 tablet; Refill: 1  Primary insomnia -     LORazepam; Take 1/2 - 1&1/2 tablets (0.5-1.5mg ) by mouth at bedtime as needed.  Dispense: 45 tablet; Refill: 5   Annual-antic guidance.  RHM UTD  declines immunizations for now as rxn to flu in past(flared UC).  Has big project at work for 3 months so can't miss work. Assessment and Plan    Onychomycosis Chronic fungal infection of the right  big toe has shown partial improvement with previous topical treatments and blue light device. Oral terbinafine was discussed, including potential side effects like liver toxicity. Nail regrowth will take about nine months with variable efficacy. Prescribe terbinafine 250 mg once daily for 12 weeks and order a liver function test at 6 weeks.  Hyperlipidemia Hyperlipidemia is managed with rosuvastatin 10 mg daily. Recent labs show LDL <100, within the target range, with an understanding of a 20-25% reduction in cardiovascular risk. Continue rosuvastatin 10 mg daily.  Depression Depression is managed with bupropion 100 mg once daily, with no suicidal ideation reported. Continue bupropion 100 mg once daily.  Ulcerative Colitis Ulcerative colitis is well-controlled with mesalamine, with no recent diarrhea episodes. Continue mesalamine.  Insomnia Intermittent insomnia is managed with lorazepam as needed for sleep, with no adverse effects reported. Continue lorazepam as needed for sleep.  Obstructive Sleep Apnea Obstructive sleep apnea is managed with CPAP, with good compliance and occasional lapses. Continue CPAP usage.  General Health Maintenance Engaged in lifestyle modifications including exercise and dietary changes. Labs from January 10th show good diabetes control (A1c 5.8), normal liver and kidney function, and no anemia. Uses My [employer]itness Pal to track diet and exercise. Continue current exercise regimen and dietary tracking with [employer]. Advise avoiding alcohol during terbinafine treatment. Encourage wearing a seatbelt, avoiding drinking and driving, using sunscreen, and not smoking.  Follow-up Follow-up appointment on February 6th. Liver function test at [facility] office in 6 weeks.        Recommended follow up: Return in about 6 months (around 12/04/2023) for chronic follow-up.  Lab/Order associations:/a fasting   Angelena Sole, MD

## 2023-06-06 NOTE — Patient Instructions (Signed)

## 2023-07-06 ENCOUNTER — Other Ambulatory Visit (HOSPITAL_COMMUNITY): Payer: Self-pay

## 2023-07-06 ENCOUNTER — Other Ambulatory Visit: Payer: Self-pay

## 2023-07-06 MED ORDER — CYCLOBENZAPRINE HCL 5 MG PO TABS
5.0000 mg | ORAL_TABLET | Freq: Every evening | ORAL | 0 refills | Status: DC | PRN
  Filled 2023-07-06: qty 90, 90d supply, fill #0

## 2023-07-07 ENCOUNTER — Other Ambulatory Visit (HOSPITAL_COMMUNITY): Payer: Self-pay

## 2023-07-13 ENCOUNTER — Other Ambulatory Visit: Payer: Self-pay | Admitting: Internal Medicine

## 2023-07-13 ENCOUNTER — Other Ambulatory Visit: Payer: Self-pay

## 2023-07-13 ENCOUNTER — Other Ambulatory Visit (HOSPITAL_COMMUNITY): Payer: Self-pay

## 2023-07-13 DIAGNOSIS — K51311 Ulcerative (chronic) rectosigmoiditis with rectal bleeding: Secondary | ICD-10-CM

## 2023-07-13 MED ORDER — MESALAMINE 1.2 G PO TBEC
4.8000 g | DELAYED_RELEASE_TABLET | Freq: Every day | ORAL | 1 refills | Status: DC
  Filled 2023-07-13: qty 360, 90d supply, fill #0
  Filled 2023-10-13: qty 360, 90d supply, fill #1

## 2023-07-20 ENCOUNTER — Encounter: Payer: Self-pay | Admitting: Family Medicine

## 2023-07-20 ENCOUNTER — Other Ambulatory Visit (INDEPENDENT_AMBULATORY_CARE_PROVIDER_SITE_OTHER)

## 2023-07-20 DIAGNOSIS — Z79899 Other long term (current) drug therapy: Secondary | ICD-10-CM

## 2023-07-20 LAB — HEPATIC FUNCTION PANEL
ALT: 25 U/L (ref 0–53)
AST: 20 U/L (ref 0–37)
Albumin: 4.7 g/dL (ref 3.5–5.2)
Alkaline Phosphatase: 71 U/L (ref 39–117)
Bilirubin, Direct: 0.1 mg/dL (ref 0.0–0.3)
Total Bilirubin: 0.4 mg/dL (ref 0.2–1.2)
Total Protein: 7.2 g/dL (ref 6.0–8.3)

## 2023-07-20 NOTE — Progress Notes (Signed)
 Liver ok

## 2023-07-22 ENCOUNTER — Other Ambulatory Visit (HOSPITAL_COMMUNITY): Payer: Self-pay

## 2023-07-26 ENCOUNTER — Ambulatory Visit
Admission: EM | Admit: 2023-07-26 | Discharge: 2023-07-26 | Disposition: A | Discharge status: Home / Self Care | Attending: Emergency Medicine | Admitting: Emergency Medicine

## 2023-07-26 DIAGNOSIS — L259 Unspecified contact dermatitis, unspecified cause: Secondary | ICD-10-CM

## 2023-07-26 MED ORDER — PREDNISONE 20 MG PO TABS: 40.0000 mg | ORAL_TABLET | Freq: Every day | ORAL | 0 refills | Status: AC

## 2023-07-26 MED ORDER — METHYLPREDNISOLONE SODIUM SUCC 125 MG IJ SOLR
60.0000 mg | Freq: Once | INTRAMUSCULAR | Status: AC
  Administered 2023-07-26: 60 mg via INTRAMUSCULAR

## 2023-07-26 NOTE — Discharge Instructions (Addendum)
 The steroid injection will start to work in about 30 minutes to reduce itching. Starting tomorrow morning, take the prednisone 40 mg daily for 5 days. Please contact your dermatologist to make an appointment for follow-up.

## 2023-07-26 NOTE — ED Provider Notes (Signed)
 [name]    CSN: [id number] Arrival date & time: 07/26/23  1317      History   Chief Complaint Chief Complaint  Patient presents with   Rash    HPI David Newman is a 63 y.o. male.  Here of here with 2-week history of rash, progressively becoming more itchy.  Not sure of any exposures.  No changes in products. Denies any oral swelling, shortness of breath, vomiting/diarrhea. He was on terbinafine weekly but stopped after about 6 weeks in case this was because of rash.  Also was on glucosamine for a few weeks, also stopped this.  He does have history of high blood pressure but has never been on medication.  BP is elevated today.  He does report increase stress with his job recently. Checks pressures at home  Past Medical History:  Diagnosis Date   Allergy Child   Sulfa Drugs   Anal fissure    Back pain    CAD (coronary artery disease)    Colon polyps    hyperplastic   DDD (degenerative disc disease), lumbar    GERD (gastroesophageal reflux disease)    Gout    High blood pressure    Hyperlipidemia    Joint pain    Knee pain    OA (osteoarthritis) of knee    Prediabetes    Right knee meniscal tear    Sleep apnea    Wears CPAP.   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HCC)    Wears contact lenses    Wears glasses     Patient Active Problem List   Diagnosis Date Noted   Primary insomnia 06/06/2023   Aortic atherosclerosis (HCC), Cardiac CT 02/27/20 03/04/2020   Esophageal thickening, Cardiac CT 02/27/20 03/04/2020   Abnormal screening cardiac CT, 02/27/20, score 336, 89th% 03/04/2020   Visceral obesity 03/04/2020   Mixed hyperlipidemia, at goal, Rx Crestor 03/04/2020   Metabolic syndrome 03/04/2020   Vitamin D deficiency, at goal with supplementation 11/13/2019   B12 deficiency, at goal with supplementation 11/13/2019   Prediabetes 11/13/2019   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   Low back pain 04/20/2018   Class 1 obesity with serious  comorbidity and body mass index (BMI) of 32.0 to 32.9 in adult 08/05/2017   OSA on CPAP 04/05/2017   Ulcerative colitis (HCC) 11/04/2015   Adjustment insomnia 11/04/2015   Allergic rhinitis 11/03/2015   S/P right knee arthroscopy 03/29/2014    Past Surgical History:  Procedure Laterality Date   COLONOSCOPY  04/24/2012   FACIAL RECONSTRUCTION SURGERY  1973  age 81   kicked in face by horse   KNEE ARTHROSCOPY Right 03/29/2014   Procedure: RIGHT ARTHROSCOPY KNEE WITH DEBRIDMENT PARTIAL medial lateral MENISCECTOMY AND CHONDROPLASTY;  Surgeon: Eugenia Mcalpine, MD;  Location: [facility];  Service: Orthopedics;  Laterality: Right;   KNEE ARTHROSCOPY WITH ANTERIOR CRUCIATE LIGAMENT (ACL) REPAIR Right 05/17/1992   KNEE ARTHROSCOPY WITH MEDIAL MENISECTOMY Left 12/28/2018   Procedure: KNEE ARTHROSCOPY WITH partial MEDIAL MENISECTOMY, debridement chondroplasty;  Surgeon: Eugenia Mcalpine, MD;  Location: [facility];  Service: Orthopedics;  Laterality: Left;  with knee block   WISDOM TOOTH EXTRACTION  age 37       Home Medications    Prior to Admission medications   Medication Sig Start Date End Date Taking? Authorizing Provider  predniSONE (DELTASONE) 20 MG tablet Take 2 tablets (40 mg total) by mouth daily with breakfast for 5 days. 07/27/23 08/01/23 Yes Tavaughn Silguero, Lurena Joiner, PA-C  aspirin EC  81 MG tablet 1 tablet Orally Once a day    [provider]  B Complex Vitamins (VITAMIN B COMPLEX) TABS Take 1 tablet by mouth daily.    [provider]  betamethasone dipropionate 0.05 % lotion Apply 1 application (a small amount) topically to the affected area on the scalp once a day 10/26/21     buPROPion ER (WELLBUTRIN SR) 100 MG 12 hr tablet Take 1 tablet (100 mg total) by mouth daily. 03/29/23     calcium carbonate (OS-CAL) 1250 (500 Ca) MG chewable tablet Chew by mouth.    [provider]  Cholecalciferol (VITAMIN D3) 1.25 MG (50000 UT) CAPS Take 1  capsule (50,000 Units total) by mouth every 14 (fourteen) days. 09/22/22     cyanocobalamin (,VITAMIN B-12,) 1000 MCG/ML injection Inject 1 mL (1,000 mcg total) into the skin once a week for 4 weeks then 1 mL every 30 (thirty) days. 07/13/21   Helane Rima, DO  cyanocobalamin (VITAMIN B12) 1000 MCG/ML injection Inject 1 mL (1,000 mcg total) into the muscle every 30 (thirty) days. 09/06/22     cyclobenzaprine (FLEXERIL) 5 MG tablet Take 1 tablet (5 mg total) by mouth at bedtime as needed. 07/06/23     Insulin Syringe-Needle U-100 (B-D INS SYR MICROFINE 1CC/27G) 27G X 5/8" 1 ML MISC Use to administer b12 injection once a week 12/01/21     Insulin Syringe-Needle U-100 27G X 5/8" 1 ML MISC use to administer b-12 injection into the skin once weekly 12/16/21   Helane Rima, DO  ketoconazole (NIZORAL) 2 % cream Apply topically to affected area 2 (two) times daily for 2 weeks for flares on feet as directed 08/04/22     LORazepam (ATIVAN) 1 MG tablet Take 1/2 - 1&1/2 tablets (0.5-1.5mg ) by mouth at bedtime as needed. 06/06/23   Jeani Sow, MD  melatonin 3 MG TABS tablet Take by mouth.    [provider]  mesalamine (LIALDA) 1.2 g EC tablet Take 4 tablets (4.8 g total) by mouth daily with breakfast. 07/13/23   Hilarie Fredrickson, MD  metroNIDAZOLE (METROGEL) 1 % gel Apply 1 application ( a small amount)  topically  to face daily. 10/27/21     rosuvastatin (CRESTOR) 10 MG tablet Take 1 tablet (10 mg total) by mouth daily. 06/06/23   Jeani Sow, MD  Syringe, Disposable, (BD PLASTIPAK SYRINGE) 3 ML MISC Use to inject B12 09/06/22   Helane Rima, DO  Syringe/Needle, Disp, (SYRINGE 3CC/25GX1") 25G X 1" 3 ML MISC For B12 injections. 07/13/21   Helane Rima, DO  terbinafine (LAMISIL) 250 MG tablet Take 1 tablet (250 mg total) by mouth daily for 12 weeks of treatment 06/06/23   Jeani Sow, MD  traZODone (DESYREL) 50 MG tablet Take 1 tablet (50 mg total) by mouth at bedtime as needed. 03/29/23      TUBERCULIN SYR 1CC/27GX1/2" (B-D TB SYRINGE 1CC/27GX1/2") 27G X 1/2" 1 ML MISC Use to inject vitamin b-12 once weekly 09/22/22     Vitamin D, Ergocalciferol, (DRISDOL) 1.25 MG (50000 UNIT) CAPS capsule Take 1 capsule (50,000 Units total) by mouth every 7 (seven) days. 07/13/21   Helane Rima, DO    Family History Family History  Problem Relation Age of Onset   Alzheimer's disease Mother    Heart disease Father 83 - 53   Skin cancer Father    Hypertension Father    Hyperlipidemia Father    Stroke Father    Arthritis Father  Cancer Father    Colon polyps Brother 78   Heart disease Brother 33   Colon cancer Paternal Uncle 15   Colon cancer Cousin 59   Stomach cancer Neg Hx     Social History Social History   Tobacco Use   Smoking status: Former    Current packs/day: 0.00    Average packs/day: 1 pack/day for 25.0 years (25.0 ttl pk-yrs)    Types: Cigarettes    Start date: 04/03/1982    Quit date: 04/04/2007    Years since quitting: 16.3   Smokeless tobacco: Never  Substance Use Topics   Alcohol use: Yes    Alcohol/week: 2.0 standard drinks of alcohol    Types: 2 Cans of beer per week    Comment: OCCASIONAL   Drug use: No     Allergies   Sulfa antibiotics and Other   Review of Systems Review of Systems  Skin:  Positive for rash.   Per HPI  Physical Exam Triage Vital Signs ED Triage Vitals  Encounter Vitals Group     BP 07/26/23 1355 (!) 176/110     Systolic BP Percentile --      Diastolic BP Percentile --      Pulse Rate 07/26/23 1355 73     Resp 07/26/23 1355 17     Temp 07/26/23 1355 98.5 F (36.9 C)     Temp Source 07/26/23 1355 Oral     SpO2 07/26/23 1355 98 %     Weight --      Height --      Head Circumference --      Peak Flow --      Pain Score 07/26/23 1356 0     Pain Loc --      Pain Education --      Exclude from Growth Chart --    No data found.  Updated Vital Signs BP (!) 165/102 (BP Location: Right Arm)   Pulse 73   Temp 98.5  F (36.9 C) (Oral)   Resp 17   SpO2 98%   Physical Exam Vitals and nursing note reviewed.  Constitutional:      General: He is not in acute distress.    Appearance: Normal appearance.  HENT:     Mouth/Throat:     Mouth: Mucous membranes are moist.     Pharynx: Oropharynx is clear. No posterior oropharyngeal erythema.  Eyes:     Conjunctiva/sclera: Conjunctivae normal.     Pupils: Pupils are equal, round, and reactive to light.  Cardiovascular:     Rate and Rhythm: Normal rate and regular rhythm.     Pulses: Normal pulses.     Heart sounds: Normal heart sounds.  Pulmonary:     Effort: Pulmonary effort is normal. No respiratory distress.     Breath sounds: Normal breath sounds. No wheezing.  Abdominal:     General: Bowel sounds are normal.     Palpations: Abdomen is soft.     Tenderness: There is no abdominal tenderness.  Musculoskeletal:        General: Normal range of motion.     Cervical back: Normal range of motion.  Skin:    Findings: Rash present.     Comments: Maculopapular rash on all extremities, chest, belly, and back. Not on face or palms.  Neurological:     Mental Status: He is alert and oriented to person, place, and time.     UC Treatments / Results  Labs (all labs ordered are  listed, but only abnormal results are displayed) Labs Reviewed - No data to display  EKG  Radiology No results found.  Procedures Procedures   Medications Ordered in UC Medications  methylPREDNISolone sodium succinate (SOLU-MEDROL) 125 mg/2 mL injection 60 mg (60 mg Intramuscular Given 07/26/23 1511)    Initial Impression / Assessment and Plan / UC Course  I have reviewed the triage vital signs and the nursing notes.  Pertinent labs & imaging results that were available during my care of the patient were reviewed by me and considered in my medical decision making (see chart for details).  Rash - possible contact dermatitis IM steroid given for acute itch. Starting  tomorrow morning, prednisone burst 40 mg daily for 5 days. He does have a dermatologist, advised contacting for follow-up.  BP is elevated in clinic.  Attribute this to patient discomfort with itching, and recent job stress.  He monitors at home and usually runs 130 systolic.  Will follow with primary care.  Final Clinical Impressions(s) / UC Diagnoses   Final diagnoses:  Contact dermatitis, unspecified contact dermatitis type, unspecified trigger     Discharge Instructions      The steroid injection will start to work in about 30 minutes to reduce itching. Starting tomorrow morning, take the prednisone 40 mg daily for 5 days. Please contact your dermatologist to make an appointment for follow-up.    ED Prescriptions     Medication Sig Dispense Auth. Provider   predniSONE (DELTASONE) 20 MG tablet Take 2 tablets (40 mg total) by mouth daily with breakfast for 5 days. 10 tablet Alethea Terhaar, Lurena Joiner, PA-C      PDMP not reviewed this encounter.   Tanisia Yokley, Lurena Joiner, [place] 07/26/23 1607

## 2023-07-26 NOTE — ED Triage Notes (Signed)
 Pt c/o rash x 2 weeks. Worsening in last 5 days. Very itchy.Only new meds was Lamisil which he started 06/07/23. He also started glucosamine supplement on 02/01 and only took for 2 weeks. No new lotions or detergents.

## 2023-08-15 ENCOUNTER — Other Ambulatory Visit: Payer: Self-pay

## 2023-08-15 ENCOUNTER — Ambulatory Visit

## 2023-08-15 ENCOUNTER — Other Ambulatory Visit (HOSPITAL_COMMUNITY): Payer: Self-pay

## 2023-08-15 ENCOUNTER — Ambulatory Visit
Admission: EM | Admit: 2023-08-15 | Discharge: 2023-08-15 | Disposition: A | Discharge status: Home / Self Care | Attending: Family Medicine | Admitting: Family Medicine

## 2023-08-15 DIAGNOSIS — S99922A Unspecified injury of left foot, initial encounter: Secondary | ICD-10-CM | POA: Diagnosis not present

## 2023-08-15 DIAGNOSIS — T1490XA Injury, unspecified, initial encounter: Secondary | ICD-10-CM | POA: Diagnosis not present

## 2023-08-15 DIAGNOSIS — M79672 Pain in left foot: Secondary | ICD-10-CM | POA: Diagnosis not present

## 2023-08-15 MED ORDER — HYDROCODONE-ACETAMINOPHEN 5-325 MG PO TABS: 1.0000 | ORAL_TABLET | Freq: Three times a day (TID) | ORAL | 0 refills | Status: DC | PRN

## 2023-08-15 MED ORDER — DICLOFENAC SODIUM 75 MG PO TBEC: 75.0000 mg | DELAYED_RELEASE_TABLET | Freq: Two times a day (BID) | ORAL | 0 refills | Status: AC

## 2023-08-15 NOTE — Discharge Instructions (Addendum)
 Advised patient to take medication as directed with food to completion.  Advised may take Norco daily or as needed for acute left heel pain.  Patient advised of sedative properties of this medication.  Encouraged to increase daily water intake to 64 ounces per day while taking these medications.  Advised if symptoms worsen and/or unresolved please follow-up with your PCP, [facility] or here for further evaluation.

## 2023-08-15 NOTE — ED Triage Notes (Signed)
 Pt presents to uc with co of left heel pain. Pt reports he was working on a boat in his garage and missed footing and lost balance and had to jump out of the boat in order to not fall and landed on the left heel and has cont .  to have pain.

## 2023-08-15 NOTE — ED Provider Notes (Signed)
 [facility]    CSN: [id number] Arrival date & time: 08/15/23  1805      History   Chief Complaint Chief Complaint  Patient presents with   Foot Pain    left    HPI David Newman is a 63 y.o. male.   HPI 63 year old male presents with left heel pain secondary to jumping out of his boat after cleaning it.  PMH significant for obesity, sleep apnea, and HLD.  Past Medical History:  Diagnosis Date   Allergy Child   Sulfa Drugs   Anal fissure    Back pain    CAD (coronary artery disease)    Colon polyps    hyperplastic   DDD (degenerative disc disease), lumbar    GERD (gastroesophageal reflux disease)    Gout    High blood pressure    Hyperlipidemia    Joint pain    Knee pain    OA (osteoarthritis) of knee    Prediabetes    Right knee meniscal tear    Sleep apnea    Wears CPAP.   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HCC)    Wears contact lenses    Wears glasses     Patient Active Problem List   Diagnosis Date Noted   Primary insomnia 06/06/2023   Aortic atherosclerosis (HCC), Cardiac CT 02/27/20 03/04/2020   Esophageal thickening, Cardiac CT 02/27/20 03/04/2020   Abnormal screening cardiac CT, 02/27/20, score 336, 89th% 03/04/2020   Visceral obesity 03/04/2020   Mixed hyperlipidemia, at goal, Rx Crestor 03/04/2020   Metabolic syndrome 03/04/2020   Vitamin D deficiency, at goal with supplementation 11/13/2019   B12 deficiency, at goal with supplementation 11/13/2019   Prediabetes 11/13/2019   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   Low back pain 04/20/2018   Class 1 obesity with serious comorbidity and body mass index (BMI) of 32.0 to 32.9 in adult 08/05/2017   OSA on CPAP 04/05/2017   Ulcerative colitis (HCC) 11/04/2015   Adjustment insomnia 11/04/2015   Allergic rhinitis 11/03/2015   S/P right knee arthroscopy 03/29/2014    Past Surgical History:  Procedure Laterality Date   COLONOSCOPY  04/24/2012   FACIAL RECONSTRUCTION SURGERY   1973  age 66   kicked in face by horse   KNEE ARTHROSCOPY Right 03/29/2014   Procedure: RIGHT ARTHROSCOPY KNEE WITH DEBRIDMENT PARTIAL medial lateral MENISCECTOMY AND CHONDROPLASTY;  Surgeon: Eugenia Mcalpine, MD;  Location: [facility];  Service: Orthopedics;  Laterality: Right;   KNEE ARTHROSCOPY WITH ANTERIOR CRUCIATE LIGAMENT (ACL) REPAIR Right 05/17/1992   KNEE ARTHROSCOPY WITH MEDIAL MENISECTOMY Left 12/28/2018   Procedure: KNEE ARTHROSCOPY WITH partial MEDIAL MENISECTOMY, debridement chondroplasty;  Surgeon: Eugenia Mcalpine, MD;  Location: [facility];  Service: Orthopedics;  Laterality: Left;  with knee block   WISDOM TOOTH EXTRACTION  age 73       Home Medications    Prior to Admission medications   Medication Sig Start Date End Date Taking? Authorizing Provider  diclofenac (VOLTAREN) 75 MG EC tablet Take 1 tablet (75 mg total) by mouth 2 (two) times daily for 10 days. 08/15/23 08/25/23 Yes Trevor Iha, FNP  HYDROcodone-acetaminophen (NORCO/VICODIN) 5-325 MG tablet Take 1 tablet by mouth every 8 (eight) hours as needed. 08/15/23  Yes Trevor Iha, FNP  aspirin EC 81 MG tablet 1 tablet Orally Once a day    [provider]  B Complex Vitamins (VITAMIN B COMPLEX) TABS Take 1 tablet by mouth daily.    [provider]  betamethasone  dipropionate 0.05 % lotion Apply 1 application (a small amount) topically to the affected area on the scalp once a day 10/26/21     buPROPion ER (WELLBUTRIN SR) 100 MG 12 hr tablet Take 1 tablet (100 mg total) by mouth daily. 03/29/23     calcium carbonate (OS-CAL) 1250 (500 Ca) MG chewable tablet Chew by mouth.    [provider]  Cholecalciferol (VITAMIN D3) 1.25 MG (50000 UT) CAPS Take 1 capsule (50,000 Units total) by mouth every 14 (fourteen) days. 09/22/22     cyanocobalamin (,VITAMIN B-12,) 1000 MCG/ML injection Inject 1 mL (1,000 mcg total) into the skin once a week for 4 weeks then 1 mL every 30  (thirty) days. 07/13/21   Helane Rima, DO  cyanocobalamin (VITAMIN B12) 1000 MCG/ML injection Inject 1 mL (1,000 mcg total) into the muscle every 30 (thirty) days. 09/06/22     cyclobenzaprine (FLEXERIL) 5 MG tablet Take 1 tablet (5 mg total) by mouth at bedtime as needed. 07/06/23     Insulin Syringe-Needle U-100 (B-D INS SYR MICROFINE 1CC/27G) 27G X 5/8" 1 ML MISC Use to administer b12 injection once a week 12/01/21     Insulin Syringe-Needle U-100 27G X 5/8" 1 ML MISC use to administer b-12 injection into the skin once weekly 12/16/21   Helane Rima, DO  ketoconazole (NIZORAL) 2 % cream Apply topically to affected area 2 (two) times daily for 2 weeks for flares on feet as directed 08/04/22     LORazepam (ATIVAN) 1 MG tablet Take 1/2 - 1&1/2 tablets (0.5-1.5mg ) by mouth at bedtime as needed. 06/06/23   Jeani Sow, MD  melatonin 3 MG TABS tablet Take by mouth.    [provider]  mesalamine (LIALDA) 1.2 g EC tablet Take 4 tablets (4.8 g total) by mouth daily with breakfast. 07/13/23   Hilarie Fredrickson, MD  metroNIDAZOLE (METROGEL) 1 % gel Apply 1 application ( a small amount)  topically  to face daily. 10/27/21     rosuvastatin (CRESTOR) 10 MG tablet Take 1 tablet (10 mg total) by mouth daily. 06/06/23   Jeani Sow, MD  Syringe, Disposable, (BD PLASTIPAK SYRINGE) 3 ML MISC Use to inject B12 09/06/22   Helane Rima, DO  Syringe/Needle, Disp, (SYRINGE 3CC/25GX1") 25G X 1" 3 ML MISC For B12 injections. 07/13/21   Helane Rima, DO  terbinafine (LAMISIL) 250 MG tablet Take 1 tablet (250 mg total) by mouth daily for 12 weeks of treatment 06/06/23   Jeani Sow, MD  traZODone (DESYREL) 50 MG tablet Take 1 tablet (50 mg total) by mouth at bedtime as needed. 03/29/23     TUBERCULIN SYR 1CC/27GX1/2" (B-D TB SYRINGE 1CC/27GX1/2") 27G X 1/2" 1 ML MISC Use to inject vitamin b-12 once weekly 09/22/22     Vitamin D, Ergocalciferol, (DRISDOL) 1.25 MG (50000 UNIT) CAPS capsule Take 1 capsule (50,000  Units total) by mouth every 7 (seven) days. 07/13/21   Helane Rima, DO    Family History Family History  Problem Relation Age of Onset   Alzheimer's disease Mother    Heart disease Father 62 - 67   Skin cancer Father    Hypertension Father    Hyperlipidemia Father    Stroke Father    Arthritis Father    Cancer Father    Colon polyps Brother 59   Heart disease Brother 35   Colon cancer Paternal Uncle 3   Colon cancer Cousin 50   Stomach cancer Neg Hx  Social History Social History   Tobacco Use   Smoking status: Former    Current packs/day: 0.00    Average packs/day: 1 pack/day for 25.0 years (25.0 ttl pk-yrs)    Types: Cigarettes    Start date: 04/03/1982    Quit date: 04/04/2007    Years since quitting: 16.3   Smokeless tobacco: Never  Substance Use Topics   Alcohol use: Yes    Alcohol/week: 2.0 standard drinks of alcohol    Types: 2 Cans of beer per week    Comment: OCCASIONAL   Drug use: No     Allergies   Sulfa antibiotics and Other   Review of Systems Review of Systems  Musculoskeletal:        Patient reports left heel pain secondary to jumping out of his boat while working on it     Physical Exam Triage Vital Signs ED Triage Vitals  Encounter Vitals Group     BP 08/15/23 1819 (!) 169/109     Systolic BP Percentile --      Diastolic BP Percentile --      Pulse Rate 08/15/23 1819 84     Resp 08/15/23 1819 16     Temp 08/15/23 1819 98.9 F (37.2 C)     Temp src --      SpO2 08/15/23 1819 98 %     Weight --      Height --      Head Circumference --      Peak Flow --      Pain Score 08/15/23 1818 2     Pain Loc --      Pain Education --      Exclude from Growth Chart --    No data found.  Updated Vital Signs BP (!) 169/109   Pulse 84   Temp 98.9 F (37.2 C)   Resp 16   SpO2 98%   Physical Exam Vitals and nursing note reviewed.  Constitutional:      Appearance: Normal appearance. He is obese.  HENT:     Head:  Normocephalic and atraumatic.     Mouth/Throat:     Mouth: Mucous membranes are moist.     Pharynx: Oropharynx is clear.  Eyes:     Extraocular Movements: Extraocular movements intact.     Conjunctiva/sclera: Conjunctivae normal.     Pupils: Pupils are equal, round, and reactive to light.  Cardiovascular:     Rate and Rhythm: Normal rate and regular rhythm.     Pulses: Normal pulses.     Heart sounds: Normal heart sounds.  Pulmonary:     Effort: Pulmonary effort is normal.     Breath sounds: Normal breath sounds. No wheezing or rhonchi.  Musculoskeletal:        General: Normal range of motion.     Comments: Left foot: Patient reporting pain over calcaneus, soft tissue swelling noted  Skin:    General: Skin is warm and dry.  Neurological:     General: No focal deficit present.     Mental Status: He is alert and oriented to person, place, and time. Mental status is at baseline.  Psychiatric:        Mood and Affect: Mood normal.        Behavior: Behavior normal.      UC Treatments / Results  Labs (all labs ordered are listed, but only abnormal results are displayed) Labs Reviewed - No data to display  EKG   Radiology DG Foot  Complete Left Result Date: 08/15/2023 CLINICAL DATA:  Left heel injury, pain EXAM: LEFT FOOT - COMPLETE 3+ VIEW COMPARISON:  None Available. FINDINGS: There is no evidence of fracture or dislocation. There is no evidence of arthropathy or other focal bone abnormality. Soft tissues are unremarkable. IMPRESSION: Negative. Electronically Signed   By: Charlett Nose M.D.   On: 08/15/2023 19:02    Procedures Procedures (including critical care time)  Medications Ordered in UC Medications - No data to display  Initial Impression / Assessment and Plan / UC Course  I have reviewed the triage vital signs and the nursing notes.  Pertinent labs & imaging results that were available during my care of the patient were reviewed by me and considered in my medical  decision making (see chart for details).     MDM: 1.  Left foot pain-Rx'd Norco 5/325 mg tablet: Take 1 tablet every 8 hours, as needed for acute/severe left heel pain; 2.  Injury of left foot, initial encounter-Rx'd diclofenac 75 mg tablet: Take 1 tablet twice daily x 10 days. Advised patient to take medication as directed with food to completion.  Advised may take Norco daily or as needed for acute left heel pain.  Patient advised of sedative properties of this medication.  Encouraged to increase daily water intake to 64 ounces per day while taking these medications.  Advised if symptoms worsen and/or unresolved please follow-up with your PCP, [facility] or here for further evaluation. Final Clinical Impressions(s) / UC Diagnoses   Final diagnoses:  Left foot pain  Injury of left foot, initial encounter     Discharge Instructions      Advised patient to take medication as directed with food to completion.  Advised may take Norco daily or as needed for acute left heel pain.  Patient advised of sedative properties of this medication.  Encouraged to increase daily water intake to 64 ounces per day while taking these medications.  Advised if symptoms worsen and/or unresolved please follow-up with your PCP, [facility] or here for further evaluation.     ED Prescriptions     Medication Sig Dispense Auth. Provider   diclofenac (VOLTAREN) 75 MG EC tablet Take 1 tablet (75 mg total) by mouth 2 (two) times daily for 10 days. 20 tablet Trevor Iha, FNP   HYDROcodone-acetaminophen (NORCO/VICODIN) 5-325 MG tablet Take 1 tablet by mouth every 8 (eight) hours as needed. 21 tablet Trevor Iha, FNP      I have reviewed the PDMP during this encounter.   Trevor Iha, FNP 08/15/23 [phone]

## 2023-10-04 ENCOUNTER — Other Ambulatory Visit (HOSPITAL_COMMUNITY): Payer: Self-pay

## 2023-10-04 ENCOUNTER — Other Ambulatory Visit: Payer: Self-pay

## 2023-10-05 ENCOUNTER — Other Ambulatory Visit: Payer: Self-pay

## 2023-10-05 ENCOUNTER — Other Ambulatory Visit (HOSPITAL_COMMUNITY): Payer: Self-pay

## 2023-10-05 MED ORDER — CYCLOBENZAPRINE HCL 5 MG PO TABS
5.0000 mg | ORAL_TABLET | Freq: Every evening | ORAL | 0 refills | Status: DC | PRN
  Filled 2023-10-05: qty 90, 90d supply, fill #0

## 2023-10-11 LAB — HM DIABETES EYE EXAM

## 2023-10-13 ENCOUNTER — Other Ambulatory Visit (HOSPITAL_COMMUNITY): Payer: Self-pay

## 2023-10-14 ENCOUNTER — Other Ambulatory Visit (HOSPITAL_COMMUNITY): Payer: Self-pay

## 2023-10-14 ENCOUNTER — Other Ambulatory Visit: Payer: Self-pay

## 2023-10-31 ENCOUNTER — Other Ambulatory Visit (HOSPITAL_COMMUNITY): Payer: Self-pay

## 2023-11-02 ENCOUNTER — Other Ambulatory Visit (HOSPITAL_COMMUNITY): Payer: Self-pay

## 2023-11-02 ENCOUNTER — Other Ambulatory Visit: Payer: Self-pay

## 2023-11-02 MED ORDER — TRAZODONE HCL 50 MG PO TABS
50.0000 mg | ORAL_TABLET | Freq: Every evening | ORAL | 1 refills | Status: DC | PRN
  Filled 2023-11-02: qty 90, 90d supply, fill #0
  Filled 2023-12-30 – 2024-01-31 (×4): qty 90, 90d supply, fill #1
  Filled 2024-01-31: qty 90, 90d supply, fill #0

## 2023-11-08 ENCOUNTER — Other Ambulatory Visit: Payer: Self-pay

## 2023-12-30 ENCOUNTER — Other Ambulatory Visit: Payer: Self-pay | Admitting: Family Medicine

## 2023-12-30 ENCOUNTER — Other Ambulatory Visit (HOSPITAL_COMMUNITY): Payer: Self-pay

## 2023-12-30 ENCOUNTER — Other Ambulatory Visit: Payer: Self-pay

## 2023-12-30 DIAGNOSIS — F5101 Primary insomnia: Secondary | ICD-10-CM

## 2023-12-30 MED ORDER — LORAZEPAM 1 MG PO TABS
0.5000 mg | ORAL_TABLET | Freq: Every evening | ORAL | 0 refills | Status: DC | PRN
  Filled 2023-12-30 (×3): qty 45, 30d supply, fill #0

## 2023-12-30 NOTE — Telephone Encounter (Signed)
 Needs to sch f/u appt within 1 mo

## 2024-01-07 ENCOUNTER — Encounter: Payer: Self-pay | Admitting: Family Medicine

## 2024-01-09 ENCOUNTER — Other Ambulatory Visit: Payer: Self-pay | Admitting: Family Medicine

## 2024-01-09 ENCOUNTER — Other Ambulatory Visit (HOSPITAL_COMMUNITY): Payer: Self-pay

## 2024-01-09 ENCOUNTER — Other Ambulatory Visit: Payer: Self-pay | Admitting: Internal Medicine

## 2024-01-09 DIAGNOSIS — K51311 Ulcerative (chronic) rectosigmoiditis with rectal bleeding: Secondary | ICD-10-CM

## 2024-01-09 MED ORDER — CYCLOBENZAPRINE HCL 5 MG PO TABS
5.0000 mg | ORAL_TABLET | Freq: Every evening | ORAL | 0 refills | Status: DC | PRN
  Filled 2024-01-09: qty 90, 90d supply, fill #0

## 2024-01-09 MED ORDER — MESALAMINE 1.2 G PO TBEC
4.8000 g | DELAYED_RELEASE_TABLET | Freq: Every day | ORAL | 0 refills | Status: DC
  Filled 2024-01-09: qty 360, 90d supply, fill #0

## 2024-01-10 ENCOUNTER — Other Ambulatory Visit (HOSPITAL_COMMUNITY): Payer: Self-pay

## 2024-01-10 ENCOUNTER — Telehealth: Payer: Self-pay | Admitting: Internal Medicine

## 2024-01-10 ENCOUNTER — Other Ambulatory Visit: Payer: Self-pay

## 2024-01-10 MED ORDER — PREDNISONE 20 MG PO TABS
ORAL_TABLET | ORAL | 1 refills | Status: AC
  Filled 2024-01-10: qty 50, 40d supply, fill #0

## 2024-01-10 MED ORDER — MESALAMINE 4 G RE ENEM
4.0000 g | ENEMA | Freq: Every day | RECTAL | 3 refills | Status: DC
  Filled 2024-01-10: qty 840, 14d supply, fill #0
  Filled 2024-01-24: qty 840, 14d supply, fill #1
  Filled 2024-02-09: qty 840, 14d supply, fill #2

## 2024-01-10 NOTE — Telephone Encounter (Signed)
 Pt states he started having a flare of his UC 3-4 days ago. He is having issues with urgency and a fair amt of blood in the stool. He had a script for Prednisone  20mg  that he had been given and he took 40mg  this am, he is requesting instruction from Dr Abran regarding a taper. He also states his prescription for mesalamine  enemas is expired and needs a new rx for them. He report he is taking his Lialda  4.8 daily. Please advise.

## 2024-01-10 NOTE — Telephone Encounter (Signed)
 Patient called and stated that he has a flare up of UC and was wanting to discuss further with the nurse to see what he needs to do. Patient is requesting a call back. Please advise.

## 2024-01-10 NOTE — Telephone Encounter (Signed)
 Scripts sent to pharmacy. Pt scheduled to see Dr Abran 02/09/24@4pm . Pt aware of appt and recommendations per Dr Abran.

## 2024-01-10 NOTE — Telephone Encounter (Signed)
 Linda, 1.  Prescribe prednisone  20 mg tablets; #60; 1 refill. 2.  Begin prednisone  40 mg daily for 10 days then 30 mg daily for 10 days then 20 mg daily for 10 days then 10 mg daily for 10 days.  Then stop. 3.  Refill mesalamine  enemas.  Multiple refills 4.  Office follow-up with myself or any of the advanced practitioners in 4 weeks Thanks, Dr. Abran

## 2024-01-31 ENCOUNTER — Other Ambulatory Visit (HOSPITAL_COMMUNITY): Payer: Self-pay

## 2024-01-31 ENCOUNTER — Other Ambulatory Visit: Payer: Self-pay

## 2024-02-09 ENCOUNTER — Ambulatory Visit: Admitting: Internal Medicine

## 2024-02-09 ENCOUNTER — Other Ambulatory Visit: Payer: Self-pay

## 2024-02-09 ENCOUNTER — Encounter: Payer: Self-pay | Admitting: Internal Medicine

## 2024-02-09 ENCOUNTER — Other Ambulatory Visit: Payer: Self-pay | Admitting: Family Medicine

## 2024-02-09 ENCOUNTER — Other Ambulatory Visit (HOSPITAL_COMMUNITY): Payer: Self-pay

## 2024-02-09 VITALS — BP 148/96 | HR 90 | Ht 71.0 in | Wt 248.4 lb

## 2024-02-09 DIAGNOSIS — K51311 Ulcerative (chronic) rectosigmoiditis with rectal bleeding: Secondary | ICD-10-CM | POA: Diagnosis not present

## 2024-02-09 DIAGNOSIS — E782 Mixed hyperlipidemia: Secondary | ICD-10-CM

## 2024-02-09 MED ORDER — ROSUVASTATIN CALCIUM 10 MG PO TABS
10.0000 mg | ORAL_TABLET | Freq: Every day | ORAL | 1 refills | Status: DC
  Filled 2024-02-09: qty 90, 90d supply, fill #0
  Filled 2024-05-05: qty 90, 90d supply, fill #1

## 2024-02-09 NOTE — Progress Notes (Signed)
 HISTORY OF PRESENT ILLNESS:  David Newman is a 63 y.o. 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  He was last seen in the office November 02, 2022.  At that time he was in clinical remission.  He was continued on Lialda  4.8 g daily. His last colonoscopy was performed April 17, 2000, at which time he was having mild flare of disease.  He was found to have mild active proctitis at the distal centimeters per rectum.  As well, inflammatory cecal patch.  Biopsies from each area were consistent with active chronic inflammatory bowel disease.  Biopsies from normal-appearing portions of the colon were normal.     He presents today for routine follow-up after contacting the office with a flare of disease approximately 1 month ago.  He was having symptoms for about a week prior to this call.  Specifically, increased frequency of loose stools with urgency and bleeding.  He had been on Lialda  4.8 g daily at the time of his flare.  He continues on that medication at that dose.  Prednisone  was initiated and tapered fashion.  40 mg for 10 days, 30 mg for 10 days, and 20 mg for 10 days (which ends today).  He starts 10 mg for 10 days tomorrow.  He also initiated Rowasa  enemas at night.  He continues with those.  Overall he reports significant improvement.  His symptoms began to improve within a few days of initiating prednisone .  Typically he has 1 good bowel movement and is done for the day.  Currently with 1 bowel movement occasionally 2.  Minimal amounts of blood.  He feels that he is getting close to his baseline.  REVIEW OF SYSTEMS:  All non-GI ROS negative. Past Medical History:  Diagnosis Date   Allergy  Child   Sulfa Drugs   Anal fissure    Back pain    CAD (coronary artery disease)    Colon polyps    hyperplastic   DDD (degenerative disc disease), lumbar    GERD (gastroesophageal reflux disease)    Gout    High blood pressure    Hyperlipidemia    Joint pain     Knee pain    OA (osteoarthritis) of knee    Prediabetes    Right knee meniscal tear    Sleep apnea    Wears CPAP.   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HCC)    Wears contact lenses    Wears glasses     Past Surgical History:  Procedure Laterality Date   COLONOSCOPY  04/24/2012   FACIAL RECONSTRUCTION SURGERY  1973  age 49   kicked in face by horse   KNEE ARTHROSCOPY Right 03/29/2014   Procedure: RIGHT ARTHROSCOPY KNEE WITH DEBRIDMENT PARTIAL medial lateral MENISCECTOMY AND CHONDROPLASTY;  Surgeon: Lamar Collet, MD;  Location: [facility];  Service: Orthopedics;  Laterality: Right;   KNEE ARTHROSCOPY WITH ANTERIOR CRUCIATE LIGAMENT (ACL) REPAIR Right 05/17/1992   KNEE ARTHROSCOPY WITH MEDIAL MENISECTOMY Left 12/28/2018   Procedure: KNEE ARTHROSCOPY WITH partial MEDIAL MENISECTOMY, debridement chondroplasty;  Surgeon: Collet Lamar, MD;  Location: [facility];  Service: Orthopedics;  Laterality: Left;  with knee block   WISDOM TOOTH EXTRACTION  age 2    Social History David Newman  reports that he quit smoking about 16 years ago. His smoking use included cigarettes. He started smoking about 41 years ago. He has a 25 pack-year smoking history. He has never used smokeless tobacco. He reports current alcohol  use of about 2.0 standard drinks of alcohol per week. He reports that he does not use drugs.  family history includes Alzheimer's disease in his mother; Arthritis in his father; Cancer in his father; Colon cancer (age of onset: 87) in his cousin; Colon cancer (age of onset: 84) in his paternal uncle; Colon polyps (age of onset: 22) in his brother; Heart disease (age of onset: 63) in his brother; Heart disease (age of onset: 42 - 72) in his father; Hyperlipidemia in his father; Hypertension in his father; Skin cancer in his father; Stroke in his father.  Allergies  Allergen Reactions   Sulfa Antibiotics Nausea And Vomiting    Other Reaction(s):  abdominal pain  Other Reaction(s): nausea/vomiting   Other Other (See Comments)    glucagon-like peptide 1-constipation       PHYSICAL EXAMINATION: Vital signs: BP (!) 148/96 (BP Location: Right Arm, Patient Position: Sitting, Cuff Size: Large)   Pulse 90   Ht 5' 11 (1.803 m)   Wt 248 lb 6 oz (112.7 kg)   BMI 34.64 kg/m   Constitutional: generally well-appearing, no acute distress Psychiatric: alert and oriented x3, cooperative Eyes: extraocular movements intact, anicteric, conjunctiva pink Mouth: oral pharynx moist, no lesions Neck: supple no lymphadenopathy Cardiovascular: heart regular rate and rhythm, no murmur Lungs: clear to auscultation bilaterally Abdomen: soft, nontender, nondistended, no obvious ascites, no peritoneal signs, normal bowel sounds, no organomegaly Rectal: Omitted Extremities: no clubbing, cyanosis, or lower extremity edema bilaterally Skin: no lesions on visible extremities Neuro: No focal deficits.  Cranial nerves intact  ASSESSMENT:   1.  Chronic ulcerative proctosigmoiditis.  Recent flare as described.  Improved after initiating prednisone  and Rowasa  enemas in addition to his maintenance Lialda . 2.  Most recent colonoscopy 2021 with proctitis and cecal patch as described.  Otherwise unremarkable exam. 2.  Mild constipation at times managed with regular fiber and MiraLAX on demand     PLAN:   1.  Continue Lialda  4.8 g daily.  Medication refilled.  Medication risks reviewed. 2.  Continue Rowasa  enemas until prednisone  taper completed 3.  Continue prednisone  taper.  Should be done in 10 days 4.  Routine office follow-up 1 year.  Contact the office in the interim for any questions or problems 5.  Routine surveillance colonoscopy planned around December 2026 A total time of 30 minutes spent preparing to see the patient, reviewing available data, obtaining comprehensive interval history, performing medically appropriate physical exam, counseling and  educating the patient regarding above listed issues, answering questions, prescribing medications, defining follow-up interval, and documenting clinical information in the health record

## 2024-02-09 NOTE — Patient Instructions (Signed)
 Please follow up as needed.  _______________________________________________________  If your blood pressure at your visit was 140/90 or greater, please contact your primary care physician to follow up on this.  _______________________________________________________  If you are age 62 or older, your body mass index should be between 23-30. Your Body mass index is 34.64 kg/m. If this is out of the aforementioned range listed, please consider follow up with your Primary Care Provider.  If you are age 5 or younger, your body mass index should be between 19-25. Your Body mass index is 34.64 kg/m. If this is out of the aformentioned range listed, please consider follow up with your Primary Care Provider.   ________________________________________________________  The [facility] providers would like to encourage you to use MYCHART to communicate with providers for non-urgent requests or questions.  Due to long hold times on the telephone, sending your provider a message by [facility] may be a faster and more efficient way to get a response.  Please allow 48 business hours for a response.  Please remember that this is for non-urgent requests.  _______________________________________________________  [facility] is using a team-based approach to care.  Your team is made up of your doctor and two to three APPS. Our APPS (Nurse Practitioners and Physician Assistants) work with your physician to ensure care continuity for you. They are fully qualified to address your health concerns and develop a treatment plan. They communicate directly with your gastroenterologist to care for you. Seeing the Advanced Practice Practitioners on your physician's team can help you by facilitating care more promptly, often allowing for earlier appointments, access to diagnostic testing, procedures, and other specialty referrals.

## 2024-02-12 ENCOUNTER — Other Ambulatory Visit: Payer: Self-pay | Admitting: Family Medicine

## 2024-02-12 DIAGNOSIS — F5101 Primary insomnia: Secondary | ICD-10-CM

## 2024-02-12 MED ORDER — LORAZEPAM 1 MG PO TABS
0.5000 mg | ORAL_TABLET | Freq: Every evening | ORAL | 0 refills | Status: DC | PRN
  Filled 2024-02-12: qty 45, 30d supply, fill #0

## 2024-02-12 NOTE — Telephone Encounter (Signed)
 Needs appt

## 2024-02-13 ENCOUNTER — Other Ambulatory Visit (HOSPITAL_COMMUNITY): Payer: Self-pay

## 2024-03-16 ENCOUNTER — Encounter: Payer: Self-pay | Admitting: Family Medicine

## 2024-03-16 ENCOUNTER — Other Ambulatory Visit (HOSPITAL_COMMUNITY): Payer: Self-pay

## 2024-03-16 ENCOUNTER — Ambulatory Visit: Admitting: Family Medicine

## 2024-03-16 VITALS — BP 138/90 | HR 88 | Temp 97.4°F | Ht 71.0 in | Wt 250.0 lb

## 2024-03-16 DIAGNOSIS — E782 Mixed hyperlipidemia: Secondary | ICD-10-CM

## 2024-03-16 DIAGNOSIS — R030 Elevated blood-pressure reading, without diagnosis of hypertension: Secondary | ICD-10-CM

## 2024-03-16 DIAGNOSIS — F5101 Primary insomnia: Secondary | ICD-10-CM

## 2024-03-16 DIAGNOSIS — K5100 Ulcerative (chronic) pancolitis without complications: Secondary | ICD-10-CM | POA: Diagnosis not present

## 2024-03-16 DIAGNOSIS — E538 Deficiency of other specified B group vitamins: Secondary | ICD-10-CM

## 2024-03-16 DIAGNOSIS — L719 Rosacea, unspecified: Secondary | ICD-10-CM

## 2024-03-16 MED ORDER — VITAMIN D3 1.25 MG (50000 UT) PO CAPS
50000.0000 [IU] | ORAL_CAPSULE | ORAL | 3 refills | Status: AC
  Filled 2024-03-16: qty 6, 84d supply, fill #0
  Filled 2024-06-09: qty 6, 84d supply, fill #1

## 2024-03-16 MED ORDER — METRONIDAZOLE 1 % EX GEL
1.0000 | Freq: Every day | CUTANEOUS | 5 refills | Status: AC
  Filled 2024-03-16: qty 60, 30d supply, fill #0
  Filled 2024-05-05: qty 60, 30d supply, fill #1

## 2024-03-16 MED ORDER — CYANOCOBALAMIN 1000 MCG/ML IJ SOLN
1000.0000 ug | INTRAMUSCULAR | 3 refills | Status: AC
  Filled 2024-03-16: qty 3, 90d supply, fill #0

## 2024-03-16 MED ORDER — TRAZODONE HCL 50 MG PO TABS
50.0000 mg | ORAL_TABLET | Freq: Every evening | ORAL | 1 refills | Status: AC | PRN
  Filled 2024-03-16 – 2024-04-29 (×2): qty 90, 90d supply, fill #0

## 2024-03-16 MED ORDER — BUPROPION HCL ER (SR) 100 MG PO TB12
100.0000 mg | ORAL_TABLET | Freq: Every day | ORAL | 3 refills | Status: AC
  Filled 2024-03-16 – 2024-05-15 (×2): qty 90, 90d supply, fill #0

## 2024-03-16 MED ORDER — LORAZEPAM 1 MG PO TABS
0.5000 mg | ORAL_TABLET | Freq: Every evening | ORAL | 5 refills | Status: AC | PRN
  Filled 2024-03-16: qty 45, 30d supply, fill #0
  Filled 2024-05-05: qty 45, 30d supply, fill #1
  Filled 2024-06-09: qty 45, 30d supply, fill #2

## 2024-03-16 NOTE — Patient Instructions (Addendum)
 It was very nice to see you today!  Amlodipine valsartan   PLEASE NOTE:  If you had any lab tests please let us  know if you have not heard back within a few days. You may see your results on MyChart before we have a chance to review them but we will give you a call once they are reviewed by us . If we ordered any referrals today, please let us  know if you have not heard from their office within the next week.   Please try these tips to maintain a healthy lifestyle:  Eat most of your calories during the day when you are active. Eliminate processed foods including packaged sweets (pies, cakes, cookies), reduce intake of potatoes, white bread, white pasta, and white rice. Look for whole grain options, oat flour or almond flour.  Each meal should contain half fruits/vegetables, one quarter protein, and one quarter carbs (no bigger than a computer mouse).  Cut down on sweet beverages. This includes juice, soda, and sweet tea. Also watch fruit intake, though this is a healthier sweet option, it still contains natural sugar! Limit to 3 servings daily.  Drink at least 1 glass of water with each meal and aim for at least 8 glasses per day  Exercise at least 150 minutes every week.

## 2024-03-16 NOTE — Progress Notes (Signed)
 Subjective:     Patient ID: David Newman, male    DOB: [birth year], 63 y.o.   MRN: [id number]  Chief Complaint  Patient presents with   Medication Refill    Pt is here today to follow up on medicine and get refills    Discussed the use of AI scribe software for clinical note transcription with the patient, who gave verbal consent to proceed.  History of Present Illness David Newman is a 63 year old male with hypertension and ulcerative colitis who presents for medication management and follow-up.  He experienced a recent exacerbation of ulcerative colitis beginning in mid-August, for which he contacted his gastroenterologist around August 25th. He was treated with a tapering dose of prednisone  starting at 40 mg, along with enemas. After about ten days, his symptoms began to subside, and he is currently feeling well with no rectal bleeding or other symptoms related to ulcerative colitis.  He is currently taking trazodone  for sleep, rosuvastatin  for cholesterol, and Wellbutrin  ER 100 mg once daily for mood stabilization. He also takes lorazepam , usually 1 mg at night for sleep, sometimes increasing to 1.5 mg. He uses melatonin and occasionally Flexeril , particularly after physical activity that exacerbates his knee pain. He has gained 18-20 pounds following a recent prednisone  course and reports increased knee pain due to cartilage loss.  He monitors his blood pressure at home, noting readings typically in the range of 130s to 140s over 80s to 90s, with recent readings such as 152/90. He is concerned about potential side effects of blood pressure medications.no cp/palp/cough/sob  He reports recent changes in his vision, including a 'cloud' over one eye and discomfort, leading him to stop wearing contacts for the past three weeks. He has not yet seen an eye doctor.  He has not used Mounjaro  for weight management in the past 8-9 months due to side effects such as nausea and  constipation, which could exacerbate his ulcerative colitis.  He experienced hives after taking Lamisil  for toe fungus, which resolved after stopping the medication and receiving a prednisone  injection at urgent care. He continues to use light therapy and topical treatments for his toe fungus.  He takes a daily aspirin , B complex vitamin, and calcium . He also takes vitamin D  every two weeks and B12 injections monthly. He has stopped taking hydrocodone .    There are no preventive care reminders to display for this patient.  Past Medical History:  Diagnosis Date   Allergy  Child   Sulfa Drugs   Anal fissure    Back pain    CAD (coronary artery disease)    Colon polyps    hyperplastic   DDD (degenerative disc disease), lumbar    GERD (gastroesophageal reflux disease)    Gout    High blood pressure    Hyperlipidemia    Joint pain    Knee pain    OA (osteoarthritis) of knee    Prediabetes    Right knee meniscal tear    Sleep apnea    Wears CPAP.   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HCC)    Wears contact lenses    Wears glasses     Past Surgical History:  Procedure Laterality Date   COLONOSCOPY  04/24/2012   FACIAL RECONSTRUCTION SURGERY  1973  age 21   kicked in face by horse   KNEE ARTHROSCOPY Right 03/29/2014   Procedure: RIGHT ARTHROSCOPY KNEE WITH DEBRIDMENT PARTIAL medial lateral MENISCECTOMY AND CHONDROPLASTY;  Surgeon: Lamar Collet, MD;  Location: [facility]  [place];  Service: Orthopedics;  Laterality: Right;   KNEE ARTHROSCOPY WITH ANTERIOR CRUCIATE LIGAMENT (ACL) REPAIR Right 05/17/1992   KNEE ARTHROSCOPY WITH MEDIAL MENISECTOMY Left 12/28/2018   Procedure: KNEE ARTHROSCOPY WITH partial MEDIAL MENISECTOMY, debridement chondroplasty;  Surgeon: Gerome Charleston, MD;  Location: [facility];  Service: Orthopedics;  Laterality: Left;  with knee block   WISDOM TOOTH EXTRACTION  age 60     Current Outpatient Medications:    aspirin  EC 81 MG tablet, 1  tablet Orally Once a day, Disp: , Rfl:    B Complex Vitamins (VITAMIN B COMPLEX) TABS, Take 1 tablet by mouth daily., Disp: , Rfl:    calcium  carbonate (OS-CAL) 1250 (500 Ca) MG chewable tablet, Chew by mouth., Disp: , Rfl:    cyclobenzaprine  (FLEXERIL ) 5 MG tablet, Take 1 tablet (5 mg total) by mouth at bedtime as needed., Disp: 90 tablet, Rfl: 0   ketoconazole  (NIZORAL ) 2 % cream, Apply topically to affected area 2 (two) times daily for 2 weeks for flares on feet as directed, Disp: 60 g, Rfl: 2   melatonin 3 MG TABS tablet, Take by mouth., Disp: , Rfl:    mesalamine  (LIALDA ) 1.2 g EC tablet, Take 4 tablets (4.8 g total) by mouth daily with breakfast. Office visit for further refills, Disp: 360 tablet, Rfl: 0   rosuvastatin  (CRESTOR ) 10 MG tablet, Take 1 tablet (10 mg total) by mouth daily., Disp: 90 tablet, Rfl: 1   buPROPion  ER (WELLBUTRIN  SR) 100 MG 12 hr tablet, Take 1 tablet (100 mg total) by mouth daily., Disp: 90 tablet, Rfl: 3   Cholecalciferol  (VITAMIN D3) 1.25 MG (50000 UT) CAPS, Take 1 capsule (50,000 Units total) by mouth every 14 (fourteen) days., Disp: 12 capsule, Rfl: 3   cyanocobalamin  (VITAMIN B12) 1000 MCG/ML injection, Inject 1 mL (1,000 mcg total) into the muscle every 30 (thirty) days., Disp: 3 mL, Rfl: 3   LORazepam  (ATIVAN ) 1 MG tablet, Take 1/2 to 1&1/2 tablets (0.5-1.5mg ) by mouth at bedtime as needed., Disp: 45 tablet, Rfl: 5   metroNIDAZOLE  (METROGEL ) 1 % gel, Apply 1 application ( a small amount)  topically  to face daily., Disp: 60 g, Rfl: 5   traZODone  (DESYREL ) 50 MG tablet, Take 1 tablet (50 mg total) by mouth at bedtime as needed., Disp: 90 tablet, Rfl: 1  Allergies  Allergen Reactions   Sulfa Antibiotics Nausea And Vomiting    Other Reaction(s): abdominal pain  Other Reaction(s): nausea/vomiting   Other Other (See Comments)    glucagon-like peptide 1-constipation   ROS neg/noncontributory except as noted HPI/below      Objective:     BP (!) 138/90    Pulse 88   Temp (!) 97.4 F (36.3 C) (Temporal)   Ht 5' 11 (1.803 m)   Wt 250 lb (113.4 kg)   SpO2 98%   BMI 34.87 kg/m  Wt Readings from Last 3 Encounters:  03/16/24 250 lb (113.4 kg)  02/09/24 248 lb 6 oz (112.7 kg)  06/06/23 234 lb 6 oz (106.3 kg)    Physical Exam VITALS: BP- 152/90 GENERAL: Well developed well nourished no acute distress HEAD EYES EARS NOSE THROAT: Normocephalic atraumatic, conjunctiva not injected, sclera nonicteric CARDIAC: Regular rate and rhythm, S1 S2 present , no murmur, dorsalis pedis 2 plus bilaterally NECK: Supple, no thyromegaly, no nodes, no carotid bruits LUNGS: Clear to auscultation bilaterally, no wheezes ABDOMEN: Bowel sounds present, soft, non tender non distended, no hepatosplenomegaly, no masses EXTREMITIES: tr edema  MUSCULOSKELETAL: No gross abnormalities NEUROLOGICAL: Alert and oriented x3, cranial nerves II through XII intact PSYCHIATRIC: Normal mood, good eye contact       Assessment & Plan:  Primary insomnia -     LORazepam ; Take 1/2 to 1&1/2 tablets (0.5-1.5mg ) by mouth at bedtime as needed.  Dispense: 45 tablet; Refill: 5 -     traZODone  HCl; Take 1 tablet (50 mg total) by mouth at bedtime as needed.  Dispense: 90 tablet; Refill: 1  B12 deficiency, at goal with supplementation -     Cyanocobalamin ; Inject 1 mL (1,000 mcg total) into the muscle every 30 (thirty) days.  Dispense: 3 mL; Refill: 3  Mixed hyperlipidemia, at goal, Rx Crestor   Ulcerative pancolitis without complication (HCC)  Elevated blood pressure reading  Rosacea  Other orders -     buPROPion  HCl ER (SR); Take 1 tablet (100 mg total) by mouth daily.  Dispense: 90 tablet; Refill: 3 -     Vitamin D3; Take 1 capsule (50,000 Units total) by mouth every 14 (fourteen) days.  Dispense: 12 capsule; Refill: 3 -     metroNIDAZOLE ; Apply 1 application ( a small amount)  topically  to face daily.  Dispense: 60 g; Refill: 5    Assessment and Plan Assessment &  Plan Elevated bp   Blood pressure remains elevated with recent readings of 152/90, 165/102, and 148/96. He is concerned about potential side effects of antihypertensive medications, such as fatigue and erectile dysfunction. Uncontrolled hypertension risks include kidney and vascular damage, and potential erectile dysfunction. Lifestyle modifications like weight loss and exercise are under consideration. Discuss potential medications, including amlodipine, which may cause leg swelling, or an ARB like valsartan, which has fewer side effects. Encourage lifestyle changes and regular blood pressure monitoring.  Ulcerative colitis   A recent exacerbation in mid-August was managed with prednisone  and enemas. Managed by GI  Knee osteoarthritis   Knee pain, especially in the right knee, has increased, possibly due to weight gain from prednisone . Flexeril  is used as needed for muscle relaxation after physical activity. Encourage weight loss and exercise to alleviate knee pain.  Insomnia   Managed with trazodone , lorazepam , and melatonin. He uses lorazepam  primarily for sleep, sometimes increasing the dose to 1.5 mg. Renew trazodone  prescription.  PDMP checked  Depressive disorder   Wellbutrin  ER 100 mg once daily helps stabilize mood in a stressful work environment. No current symptoms of depression or anxiety. Renew Wellbutrin  prescription for six months.  Hyperlipidemia   Managed with rosuvastatin .  Vitamin D  and B vitamin deficiencies   Vitamin D  is taken every other Saturday. B12 injections are taken monthly, with a current supply available. Renew B12 injection prescription.  Rosacea   Out of metrogel , which is used for management. Prescribe metrogel .  Onychomycosis   Previous treatment with Lamisil  caused hives, leading to discontinuation. Condition has improved with light therapy and topical treatments. Consider over-the-counter Lamisil  for remaining symptoms.     Return in about 3 months  (around 06/16/2024) for annual physical.  Jenkins CHRISTELLA Carrel, MD

## 2024-03-17 ENCOUNTER — Other Ambulatory Visit: Payer: Self-pay

## 2024-03-17 DIAGNOSIS — L719 Rosacea, unspecified: Secondary | ICD-10-CM | POA: Insufficient documentation

## 2024-03-27 ENCOUNTER — Other Ambulatory Visit (HOSPITAL_COMMUNITY): Payer: Self-pay

## 2024-03-27 MED ORDER — LOTEMAX SM 0.38 % OP GEL
1.0000 [drp] | Freq: Three times a day (TID) | OPHTHALMIC | 0 refills | Status: AC
  Filled 2024-03-27 – 2024-04-04 (×3): qty 5, 14d supply, fill #0

## 2024-03-28 ENCOUNTER — Other Ambulatory Visit (HOSPITAL_COMMUNITY): Payer: Self-pay

## 2024-04-04 ENCOUNTER — Other Ambulatory Visit: Payer: Self-pay

## 2024-04-04 ENCOUNTER — Other Ambulatory Visit (HOSPITAL_COMMUNITY): Payer: Self-pay

## 2024-04-04 ENCOUNTER — Other Ambulatory Visit: Payer: Self-pay | Admitting: Internal Medicine

## 2024-04-04 DIAGNOSIS — K51311 Ulcerative (chronic) rectosigmoiditis with rectal bleeding: Secondary | ICD-10-CM

## 2024-04-04 MED ORDER — MESALAMINE 1.2 G PO TBEC
4.8000 g | DELAYED_RELEASE_TABLET | Freq: Every day | ORAL | 1 refills | Status: AC
  Filled 2024-04-04: qty 360, 90d supply, fill #0

## 2024-04-05 ENCOUNTER — Other Ambulatory Visit (HOSPITAL_COMMUNITY): Payer: Self-pay

## 2024-04-09 ENCOUNTER — Other Ambulatory Visit: Payer: Self-pay | Admitting: Family Medicine

## 2024-04-09 ENCOUNTER — Other Ambulatory Visit: Payer: Self-pay

## 2024-04-09 ENCOUNTER — Other Ambulatory Visit (HOSPITAL_COMMUNITY): Payer: Self-pay

## 2024-04-09 MED ORDER — CYCLOBENZAPRINE HCL 5 MG PO TABS
5.0000 mg | ORAL_TABLET | Freq: Every evening | ORAL | 0 refills | Status: AC | PRN
  Filled 2024-04-09: qty 90, 90d supply, fill #0

## 2024-04-10 ENCOUNTER — Encounter: Payer: Self-pay | Admitting: Family Medicine

## 2024-04-11 ENCOUNTER — Other Ambulatory Visit: Payer: Self-pay | Admitting: Family

## 2024-04-11 ENCOUNTER — Other Ambulatory Visit (HOSPITAL_COMMUNITY): Payer: Self-pay

## 2024-04-11 MED ORDER — VALSARTAN 40 MG PO TABS
40.0000 mg | ORAL_TABLET | Freq: Every day | ORAL | 3 refills | Status: DC
  Filled 2024-04-11: qty 90, 90d supply, fill #0

## 2024-04-30 ENCOUNTER — Other Ambulatory Visit (HOSPITAL_COMMUNITY): Payer: Self-pay

## 2024-04-30 ENCOUNTER — Other Ambulatory Visit: Payer: Self-pay

## 2024-05-07 ENCOUNTER — Other Ambulatory Visit: Payer: Self-pay

## 2024-05-08 ENCOUNTER — Other Ambulatory Visit (HOSPITAL_COMMUNITY): Payer: Self-pay

## 2024-05-15 ENCOUNTER — Other Ambulatory Visit (HOSPITAL_COMMUNITY): Payer: Self-pay

## 2024-05-15 ENCOUNTER — Other Ambulatory Visit: Payer: Self-pay

## 2024-05-16 ENCOUNTER — Telehealth: Admitting: Physician Assistant

## 2024-05-16 DIAGNOSIS — B9689 Other specified bacterial agents as the cause of diseases classified elsewhere: Secondary | ICD-10-CM

## 2024-05-16 DIAGNOSIS — J208 Acute bronchitis due to other specified organisms: Secondary | ICD-10-CM | POA: Diagnosis not present

## 2024-05-16 MED ORDER — PROMETHAZINE-DM 6.25-15 MG/5ML PO SYRP: 5.0000 mL | ORAL_SOLUTION | Freq: Three times a day (TID) | ORAL | 0 refills | Status: DC | PRN

## 2024-05-16 MED ORDER — AZITHROMYCIN 250 MG PO TABS: ORAL_TABLET | ORAL | 0 refills | Status: AC

## 2024-05-16 MED ORDER — PREDNISONE 20 MG PO TABS: 40.0000 mg | ORAL_TABLET | Freq: Every day | ORAL | 0 refills | Status: DC

## 2024-05-16 MED ORDER — BENZONATATE 100 MG PO CAPS: 100.0000 mg | ORAL_CAPSULE | Freq: Three times a day (TID) | ORAL | 0 refills | Status: DC | PRN

## 2024-05-16 NOTE — Progress Notes (Signed)
 We are sorry that you are not feeling well.  Here is how we plan to help!  Based on your presentation I believe you most likely have A cough due to bacteria.  When patients have a fever and a productive cough with a change in color or increased sputum production, we are concerned about bacterial bronchitis.  If left untreated it can progress to pneumonia.  If your symptoms do not improve with your treatment plan it is important that you contact your provider.   I have prescribed Azithromyin 250 mg: two tablets now and then one tablet daily for 4 additonal days    In addition you may use A prescription cough medication called Tessalon Perles 100mg . You may take 1-2 capsules every 8 hours as needed for your cough. You may also use a Prescription cough syrup called Promethazine  DM Take 5mL every 8 hours as needed for cough, can be used with Tessalon above as needed.   I have prescribed Prednisone  20mg  Take 2 tablets (40mg ) daily for 5 days.   From your responses in the eVisit questionnaire you describe inflammation in the upper respiratory tract which is causing a significant cough.  This is commonly called Bronchitis and has four common causes:   Allergies Viral Infections Acid Reflux Bacterial Infection Allergies, viruses and acid reflux are treated by controlling symptoms or eliminating the cause. An example might be a cough caused by taking certain blood pressure medications. You stop the cough by changing the medication. Another example might be a cough caused by acid reflux. Controlling the reflux helps control the cough.  USE OF BRONCHODILATOR (RESCUE) INHALERS: There is a risk from using your bronchodilator too frequently.  The risk is that over-reliance on a medication which only relaxes the muscles surrounding the breathing tubes can reduce the effectiveness of medications prescribed to reduce swelling and congestion of the tubes themselves.  Although you feel brief relief from the  bronchodilator inhaler, your asthma may actually be worsening with the tubes becoming more swollen and filled with mucus.  This can delay other crucial treatments, such as oral steroid medications. If you need to use a bronchodilator inhaler daily, several times per day, you should discuss this with your provider.  There are probably better treatments that could be used to keep your asthma under control.     HOME CARE Only take medications as instructed by your medical team. Complete the entire course of an antibiotic. Drink plenty of fluids and get plenty of rest. Avoid close contacts especially the very young and the elderly Cover your mouth if you cough or cough into your sleeve. Always remember to wash your hands A steam or ultrasonic humidifier can help congestion.   GET HELP RIGHT AWAY IF: You develop worsening fever. You become short of breath You cough up blood. Your symptoms persist after you have completed your treatment plan MAKE SURE YOU  Understand these instructions. Will watch your condition. Will get help right away if you are not doing well or get worse.  Your e-visit answers were reviewed by a board certified advanced clinical practitioner to complete your personal care plan.  Depending on the condition, your plan could have included both over the counter or prescription medications. If there is a problem please reply  once you have received a response from your provider. Your safety is important to us .  If you have drug allergies check your prescription carefully.    You can use MyChart to ask questions about todays  visit, request a non-urgent call back, or ask for a work or school excuse for 24 hours related to this e-Visit. If it has been greater than 24 hours you will need to follow up with your provider, or enter a new e-Visit to address those concerns. You will get an e-mail in the next two days asking about your experience.  I hope that your e-visit has been  valuable and will speed your recovery. Thank you for using e-visits.   I have spent 5 minutes in review of e-visit questionnaire, review and updating patient chart, medical decision making and response to patient.   Delon CHRISTELLA Dickinson, PA-C

## 2024-06-04 ENCOUNTER — Encounter: Payer: Self-pay | Admitting: Family Medicine

## 2024-06-05 NOTE — Telephone Encounter (Signed)
 noted

## 2024-06-08 ENCOUNTER — Other Ambulatory Visit: Payer: Self-pay

## 2024-06-08 ENCOUNTER — Telehealth: Payer: Self-pay

## 2024-06-08 DIAGNOSIS — E8881 Metabolic syndrome: Secondary | ICD-10-CM

## 2024-06-08 DIAGNOSIS — E782 Mixed hyperlipidemia: Secondary | ICD-10-CM

## 2024-06-08 DIAGNOSIS — R030 Elevated blood-pressure reading, without diagnosis of hypertension: Secondary | ICD-10-CM

## 2024-06-08 DIAGNOSIS — E538 Deficiency of other specified B group vitamins: Secondary | ICD-10-CM

## 2024-06-08 DIAGNOSIS — R7303 Prediabetes: Secondary | ICD-10-CM

## 2024-06-08 DIAGNOSIS — Z79899 Other long term (current) drug therapy: Secondary | ICD-10-CM

## 2024-06-08 DIAGNOSIS — E559 Vitamin D deficiency, unspecified: Secondary | ICD-10-CM

## 2024-06-08 DIAGNOSIS — Z125 Encounter for screening for malignant neoplasm of prostate: Secondary | ICD-10-CM

## 2024-06-08 NOTE — Telephone Encounter (Signed)
 Orders added

## 2024-06-08 NOTE — Telephone Encounter (Signed)
 Copied from CRM #[id number]. Topic: Clinical - Request for Lab/Test Order >> Jun 08, 2024 10:46 AM Vena HERO wrote: Reason for CRM: Pt scheduled a physical for 04/23 but is wanting to have routine lab orders put in to complete before the physical and is also requesting a PSA order to be put in a well. Pt requests a call back to confirm when these orders or put in to schedule his appt.

## 2024-06-11 ENCOUNTER — Other Ambulatory Visit: Payer: Self-pay

## 2024-06-14 ENCOUNTER — Ambulatory Visit: Payer: Self-pay | Admitting: Family Medicine

## 2024-06-14 ENCOUNTER — Other Ambulatory Visit

## 2024-06-14 DIAGNOSIS — Z125 Encounter for screening for malignant neoplasm of prostate: Secondary | ICD-10-CM | POA: Diagnosis not present

## 2024-06-14 DIAGNOSIS — Z79899 Other long term (current) drug therapy: Secondary | ICD-10-CM

## 2024-06-14 DIAGNOSIS — E782 Mixed hyperlipidemia: Secondary | ICD-10-CM | POA: Diagnosis not present

## 2024-06-14 DIAGNOSIS — E559 Vitamin D deficiency, unspecified: Secondary | ICD-10-CM

## 2024-06-14 DIAGNOSIS — R7303 Prediabetes: Secondary | ICD-10-CM

## 2024-06-14 DIAGNOSIS — E8881 Metabolic syndrome: Secondary | ICD-10-CM | POA: Diagnosis not present

## 2024-06-14 LAB — LIPID PANEL
Cholesterol: 205 mg/dL — ABNORMAL HIGH (ref 28–200)
HDL: 73.7 mg/dL
LDL Cholesterol: 66 mg/dL (ref 10–99)
NonHDL: 131.56
Total CHOL/HDL Ratio: 3
Triglycerides: 328 mg/dL — ABNORMAL HIGH (ref 10.0–149.0)
VLDL: 65.6 mg/dL — ABNORMAL HIGH (ref 0.0–40.0)

## 2024-06-14 LAB — COMPREHENSIVE METABOLIC PANEL WITH GFR
ALT: 41 U/L (ref 3–53)
AST: 40 U/L — ABNORMAL HIGH (ref 5–37)
Albumin: 4.7 g/dL (ref 3.5–5.2)
Alkaline Phosphatase: 77 U/L (ref 39–117)
BUN: 21 mg/dL (ref 6–23)
CO2: 27 meq/L (ref 19–32)
Calcium: 9.8 mg/dL (ref 8.4–10.5)
Chloride: 101 meq/L (ref 96–112)
Creatinine, Ser: 1.41 mg/dL (ref 0.40–1.50)
GFR: 53.4 mL/min — ABNORMAL LOW
Glucose, Bld: 139 mg/dL — ABNORMAL HIGH (ref 70–99)
Potassium: 4.1 meq/L (ref 3.5–5.1)
Sodium: 136 meq/L (ref 135–145)
Total Bilirubin: 0.7 mg/dL (ref 0.2–1.2)
Total Protein: 7.3 g/dL (ref 6.0–8.3)

## 2024-06-14 LAB — CBC WITH DIFFERENTIAL/PLATELET
Basophils Absolute: 0 10*3/uL (ref 0.0–0.1)
Basophils Relative: 0.5 % (ref 0.0–3.0)
Eosinophils Absolute: 0.1 10*3/uL (ref 0.0–0.7)
Eosinophils Relative: 2.2 % (ref 0.0–5.0)
HCT: 41.4 % (ref 39.0–52.0)
Hemoglobin: 14.3 g/dL (ref 13.0–17.0)
Lymphocytes Relative: 23.4 % (ref 12.0–46.0)
Lymphs Abs: 1.5 10*3/uL (ref 0.7–4.0)
MCHC: 34.5 g/dL (ref 30.0–36.0)
MCV: 92.7 fl (ref 78.0–100.0)
Monocytes Absolute: 0.7 10*3/uL (ref 0.1–1.0)
Monocytes Relative: 10.8 % (ref 3.0–12.0)
Neutro Abs: 4.1 10*3/uL (ref 1.4–7.7)
Neutrophils Relative %: 63.1 % (ref 43.0–77.0)
Platelets: 235 10*3/uL (ref 150.0–400.0)
RBC: 4.46 Mil/uL (ref 4.22–5.81)
RDW: 13.6 % (ref 11.5–15.5)
WBC: 6.4 10*3/uL (ref 4.0–10.5)

## 2024-06-14 LAB — TSH: TSH: 3.13 u[IU]/mL (ref 0.35–5.50)

## 2024-06-14 LAB — PSA: PSA: 1.43 ng/mL (ref 0.10–4.00)

## 2024-06-14 LAB — HEMOGLOBIN A1C: Hgb A1c MFr Bld: 6 % (ref 4.6–6.5)

## 2024-06-14 LAB — VITAMIN D 25 HYDROXY (VIT D DEFICIENCY, FRACTURES): VITD: 71.28 ng/mL (ref 30.00–100.00)

## 2024-06-14 NOTE — Progress Notes (Signed)
 Nothing urgent.  Will discuss at appt next week

## 2024-06-15 NOTE — Progress Notes (Signed)
Pt has read results.

## 2024-06-20 ENCOUNTER — Ambulatory Visit: Admitting: Family Medicine

## 2024-06-20 ENCOUNTER — Encounter: Payer: Self-pay | Admitting: Family Medicine

## 2024-06-20 ENCOUNTER — Other Ambulatory Visit (HOSPITAL_COMMUNITY): Payer: Self-pay

## 2024-06-20 VITALS — BP 138/78 | HR 82 | Temp 97.7°F | Ht 71.0 in | Wt 244.2 lb

## 2024-06-20 DIAGNOSIS — E782 Mixed hyperlipidemia: Secondary | ICD-10-CM

## 2024-06-20 DIAGNOSIS — Z79899 Other long term (current) drug therapy: Secondary | ICD-10-CM

## 2024-06-20 DIAGNOSIS — I10 Essential (primary) hypertension: Secondary | ICD-10-CM | POA: Insufficient documentation

## 2024-06-20 DIAGNOSIS — Z0000 Encounter for general adult medical examination without abnormal findings: Secondary | ICD-10-CM

## 2024-06-20 MED ORDER — ROSUVASTATIN CALCIUM 10 MG PO TABS
10.0000 mg | ORAL_TABLET | Freq: Every day | ORAL | 3 refills | Status: AC
  Filled 2024-06-20: qty 90, 90d supply, fill #0

## 2024-06-20 MED ORDER — VALSARTAN 80 MG PO TABS
120.0000 mg | ORAL_TABLET | Freq: Every day | ORAL | 0 refills | Status: AC
  Filled 2024-06-20: qty 135, 90d supply, fill #0

## 2024-06-20 NOTE — Patient Instructions (Addendum)
 It was very nice to see you today!  Increase valsartan  to 120mg  and let me know bp's in 2 weeks.  Do labs at  get X-ray/labs at [facility].  [address]  hours 8=M-F 8:00-5.   In <1 month   Debrox for ears  use it 5 days and sch appt for it   PLEASE NOTE:  If you had any lab tests please let us  know if you have not heard back within a few days. You may see your results on MyChart before we have a chance to review them but we will give you a call once they are reviewed by us . If we ordered any referrals today, please let us  know if you have not heard from their office within the next week.   Please try these tips to maintain a healthy lifestyle:  Eat most of your calories during the day when you are active. Eliminate processed foods including packaged sweets (pies, cakes, cookies), reduce intake of potatoes, white bread, white pasta, and white rice. Look for whole grain options, oat flour or almond flour.  Each meal should contain half fruits/vegetables, one quarter protein, and one quarter carbs (no bigger than a computer mouse).  Cut down on sweet beverages. This includes juice, soda, and sweet tea. Also watch fruit intake, though this is a healthier sweet option, it still contains natural sugar! Limit to 3 servings daily.  Drink at least 1 glass of water with each meal and aim for at least 8 glasses per day  Exercise at least 150 minutes every week.

## 2024-06-20 NOTE — Progress Notes (Signed)
 " Phone: [phone]   Subjective:  Patient 64 y.o. male presenting for annual physical.  Chief Complaint  Patient presents with   Annual Exam    Pt is here for CPE    Discussed the use of AI scribe software for clinical note transcription with the patient, who gave verbal consent to proceed.  History of Present Illness David Newman is a 64 year old male who presents for an annual physical exam.  He has been diagnosed with dry eye and eyelash mites, for which he uses prescribed eye drops. The condition appears to be improving.  HLD-He is currently taking Crestor  daily and has about 15 to 20 tablets left. He has not been exercising recently. Recent blood work showed a fasting blood sugar level of 139 mg/dL, with only a couple of sips of black coffee consumed prior to the test. He is not currently taking  Mounjaro  available. His A1c has increased from 5.5% in 2022 to 6%. He reports high triglycerides. He recently went on a cruise where he ate poorly.  His liver function test showed a slight elevation in AST. He is unsure if he drank alcohol before the blood work and recently went on vacation. He has been on Crestor  for a long time  HTN-newer dx- recently increased his Valsartan  dose from 40 mg to 80 mg, which initially caused loose stools but has since stabilized. He has been monitoring his blood pressure, which has been ranging from 130s to 150s over high 80s to 100, with no associated symptoms.  His PSA levels have increased from 0.52 three years ago to 1.46. He expresses concern about prostate cancer.  No headache, dizziness, chest pain, shortness of breath, nausea, vomiting, diarrhea, constipation, or significant urinary symptoms. He reports occasional darker urine, which he attributes to possible dehydration. He is sleeping well with the use of trazodone  as needed and denies any depressive symptoms.  Not exercising  He recently went on vacation to [place] and a  cruise.    See problem oriented charting- ROS- ROS: Gen: no fever, chills  Skin: no rash, itching ENT: no ear pain, ear drainage, nasal congestion, rhinorrhea, sinus pressure, sore throat.  About once/year has to get ears irrigated Eyes: no blurry vision, double vision Resp: no cough, wheeze,SOB CV: no CP, palpitations, LE edema,  GI: no heartburn, n/v/d/c, abd pain GU: no dysuria, urgency, frequency, hematuria MSK: no joint pain, myalgias, back pain Neuro: no dizziness, headache, weakness, vertigo Psych: no depression, anxiety, insomnia, SI   The following were reviewed and entered/updated in epic: Past Medical History:  Diagnosis Date   Allergy  Child   Sulfa Drugs   Anal fissure    Back pain    CAD (coronary artery disease)    Colon polyps    hyperplastic   DDD (degenerative disc disease), lumbar    Dry eye    GERD (gastroesophageal reflux disease)    Gout    High blood pressure    Hyperlipidemia    Joint pain    Knee pain    OA (osteoarthritis) of knee    Prediabetes    Right knee meniscal tear    Sleep apnea    Wears CPAP.   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HCC)    Wears contact lenses    Wears glasses    Patient Active Problem List   Diagnosis Date Noted   Primary hypertension 06/20/2024   Rosacea 03/17/2024   Primary insomnia 06/06/2023   Aortic atherosclerosis (HCC), Cardiac CT  02/27/20 03/04/2020   Esophageal thickening, Cardiac CT 02/27/20 03/04/2020   Abnormal screening cardiac CT, 02/27/20, score 336, 89th% 03/04/2020   Visceral obesity 03/04/2020   Mixed hyperlipidemia, at goal, Rx Crestor  03/04/2020   Metabolic syndrome 03/04/2020   Vitamin D  deficiency, at goal with supplementation 11/13/2019   B12 deficiency, at goal with supplementation 11/13/2019   Prediabetes 11/13/2019   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   Low back pain 04/20/2018   Class 1 obesity with serious comorbidity and body mass index (BMI) of 32.0 to 32.9 in adult  08/05/2017   OSA on CPAP 04/05/2017   Ulcerative colitis (HCC) 11/04/2015   Adjustment insomnia 11/04/2015   Allergic rhinitis 11/03/2015   S/P right knee arthroscopy 03/29/2014   Past Surgical History:  Procedure Laterality Date   COLONOSCOPY  04/24/2012   FACIAL RECONSTRUCTION SURGERY  1973  age 36   kicked in face by horse   KNEE ARTHROSCOPY Right 03/29/2014   Procedure: RIGHT ARTHROSCOPY KNEE WITH DEBRIDMENT PARTIAL medial lateral MENISCECTOMY AND CHONDROPLASTY;  Surgeon: Lamar Collet, MD;  Location: [facility];  Service: Orthopedics;  Laterality: Right;   KNEE ARTHROSCOPY WITH ANTERIOR CRUCIATE LIGAMENT (ACL) REPAIR Right 05/17/1992   KNEE ARTHROSCOPY WITH MEDIAL MENISECTOMY Left 12/28/2018   Procedure: KNEE ARTHROSCOPY WITH partial MEDIAL MENISECTOMY, debridement chondroplasty;  Surgeon: Collet Lamar, MD;  Location: [facility];  Service: Orthopedics;  Laterality: Left;  with knee block   WISDOM TOOTH EXTRACTION  age 61    Family History  Problem Relation Age of Onset   Alzheimer's disease Mother    Heart disease Father 62 - 63   Skin cancer Father    Hypertension Father    Hyperlipidemia Father    Stroke Father    Arthritis Father    Cancer Father    Colon polyps Brother 28   Heart disease Brother 61   Colon cancer Paternal Uncle 13   Colon cancer Cousin 50   Stomach cancer Neg Hx     Medications- reviewed and updated Current Outpatient Medications  Medication Sig Dispense Refill   aspirin  EC 81 MG tablet 1 tablet Orally Once a day     B Complex Vitamins (VITAMIN B COMPLEX) TABS Take 1 tablet by mouth daily.     buPROPion  ER (WELLBUTRIN  SR) 100 MG 12 hr tablet Take 1 tablet (100 mg total) by mouth daily. 90 tablet 3   calcium  carbonate (OS-CAL) 1250 (500 Ca) MG chewable tablet Chew by mouth.     Cholecalciferol  (VITAMIN D3) 1.25 MG (50000 UT) CAPS Take 1 capsule (50,000 Units total) by mouth every 14 (fourteen) days. 12 capsule 3    cyanocobalamin  (VITAMIN B12) 1000 MCG/ML injection Inject 1 mL (1,000 mcg total) into the muscle every 30 (thirty) days. 3 mL 3   cyclobenzaprine  (FLEXERIL ) 5 MG tablet Take 1 tablet (5 mg total) by mouth at bedtime as needed. 90 tablet 0   ketoconazole  (NIZORAL ) 2 % cream Apply topically to affected area 2 (two) times daily for 2 weeks for flares on feet as directed 60 g 2   LORazepam  (ATIVAN ) 1 MG tablet Take 1/2 to 1&1/2 tablets (0.5-1.5mg ) by mouth at bedtime as needed. 45 tablet 5   LOTEMAX  SM 0.38 % GEL Place 1 drop into both eyes 3 (three) times daily for 14 days then stop 5 g 0   melatonin 3 MG TABS tablet Take by mouth.     mesalamine  (LIALDA ) 1.2 g EC tablet Take 4 tablets (4.8  g total) by mouth daily with breakfast. 360 tablet 1   metroNIDAZOLE  (METROGEL ) 1 % gel Apply 1 application ( a small amount)  topically  to face daily. 60 g 5   traZODone  (DESYREL ) 50 MG tablet Take 1 tablet (50 mg total) by mouth at bedtime as needed. 90 tablet 1   rosuvastatin  (CRESTOR ) 10 MG tablet Take 1 tablet (10 mg total) by mouth daily. 90 tablet 3   valsartan  (DIOVAN ) 80 MG tablet Take 1.5 tablets (120 mg total) by mouth daily. 135 tablet 0   No current facility-administered medications for this visit.    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 DP 2+B LUNGS: CTAB. No wheezes ABDOMEN:  BS+, soft, NTND, No HSM, no masses EXT:  no edema MSK: no gross abnormalities. MS 5/5 all 4 NEURO: A&O x3.   CN II-XII intact.  PSYCH: normal mood. Good eye contact  Disc labs   Assessment and Plan   Health Maintenance counseling: 1. Anticipatory guidance: Patient counseled regarding regular dental exams q6 months, eye exams yearly, avoiding smoking and second hand smoke, limiting alcohol to 2 beverages per day.   2. Risk factor reduction:  Advised patient of need for regular exercise and diet rich in fruits and vegetables to reduce risk of heart attack and stroke. Exercise- encouraged.   Wt Readings from Last 3 Encounters:  06/20/24 244 lb 4 oz (110.8 kg)  03/16/24 250 lb (113.4 kg)  02/09/24 248 lb 6 oz (112.7 kg)   3. Immunizations/screenings/ancillary studies Immunization History  Administered Date(s) Administered   Influenza Split 03/07/2017, 02/26/2020   Influenza-Unspecified 03/07/2017, 03/08/2018   PFIZER Comirnaty(Gray Top)Covid-19 Tri-Sucrose Vaccine 10/31/2020   PFIZER(Purple Top)SARS-COV-2 Vaccination 06/01/2019, 07/02/2019, 03/14/2020   Pfizer Covid-19 Vaccine Bivalent Booster 19yrs & up 03/20/2021, 03/30/2021   Td 05/17/2006   Td (Adult),5 Lf Tetanus Toxid, Preservative Free 09/01/2016   Tdap 05/17/2006, 09/01/2016   There are no preventive care reminders to display for this patient.  4. Prostate cancer screening >55yo - risk factors?  Lab Results  Component Value Date   PSA 1.43 06/14/2024    5. Colon cancer screening:due in December 6. Skin cancer screening- Iadvised regular sunscreen use. Denies worrisome, changing, or new skin lesions.  7. Smoking associated screening (lung cancer screening, AAA screen 65-75, UA)- non smoke  Wellness examination  Primary hypertension -     Valsartan ; Take 1.5 tablets (120 mg total) by mouth daily.  Dispense: 135 tablet; Refill: 0 -     Comprehensive metabolic panel with GFR; Future -     Microalbumin / creatinine urine ratio; Future  Mixed hyperlipidemia, at goal, Rx Crestor  -     Rosuvastatin  Calcium ; Take 1 tablet (10 mg  total) by mouth daily.  Dispense: 90 tablet; Refill: 3  High risk medication use -     Comprehensive metabolic panel with GFR; Future -     Microalbumin / creatinine  urine ratio; Future    Assessment and Plan Assessment & Plan Primary hypertension   Chronic.  Not ideally controlled Although increasing valsartan  to 160 mg was discussed, he prefers a lower dose initially. Valsartan  is increased to 120 mg daily. Blood pressure will be monitored, and readings reported in a few weeks. Labs will be repeated in one month to assess kidney function.  Mixed hyperlipidemia   Triglycerides are elevated, likely due to recent dietary changes during vacation. He continues taking Crestor  daily. The potential impact of Crestor  on liver enzymes was discussed. He is encouraged to make dietary modifications to manage triglyceride levels.  Prediabetes   Fasting blood sugar is elevated at 139 mg/dL, with J8r increased from 5.5% to 6%, indicating prediabetes. The potential use of Mounjaro  for weight management and blood sugar control was discussed. He has Mounjaro  available and plans to start microdosing. Regular exercise and dietary modifications are encouraged.  Elevated liver enzymes   AST is slightly elevated, possibly due to medication or recent alcohol consumption. There is no significant concern at this time, but monitoring is required. Moderation in alcohol consumption is advised.  Ulcerative colitis   Stool consistency has stabilized after initial loose stools following the valsartan  dose increase. Monitoring for any changes in stool consistency or UC symptoms will continue.  Dry eye syndrome   Symptoms of dry eye syndrome are improving with current treatment, which will be continued.  General Health Maintenance   The importance of regular exercise and a healthy diet was discussed. He is encouraged to stay active and make lifestyle modifications to improve overall health. Antic  guidance     Recommended follow up: Return in about 3 months (around 09/17/2024) for HTN, cholesterol.  Lab/Order associations:n/a fasting   Jenkins CHRISTELLA Carrel, MD     [1]  Allergies Allergen Reactions   Sulfa Antibiotics Nausea And Vomiting    Other Reaction(s): abdominal pain  Other Reaction(s): nausea/vomiting   Other Other (See Comments)    glucagon-like peptide 1-constipation   "

## 2024-09-06 ENCOUNTER — Encounter: Admitting: Family Medicine

## 2024-09-18 ENCOUNTER — Ambulatory Visit: Admitting: Family Medicine
